# Patient Record
Sex: Female | Born: 1965 | Race: Black or African American | Hispanic: No | Marital: Single | Smoking: Never smoker
Health system: Southern US, Community
[De-identification: ages and names within clinical notes are randomized; demographics above are authoritative.]

## PROBLEM LIST (undated history)

## (undated) DIAGNOSIS — Z21 Asymptomatic human immunodeficiency virus [HIV] infection status: Secondary | ICD-10-CM

## (undated) DIAGNOSIS — I10 Essential (primary) hypertension: Secondary | ICD-10-CM

## (undated) DIAGNOSIS — B20 Human immunodeficiency virus [HIV] disease: Secondary | ICD-10-CM

## (undated) DIAGNOSIS — F419 Anxiety disorder, unspecified: Secondary | ICD-10-CM

## (undated) DIAGNOSIS — T7840XA Allergy, unspecified, initial encounter: Secondary | ICD-10-CM

## (undated) DIAGNOSIS — G4700 Insomnia, unspecified: Secondary | ICD-10-CM

## (undated) HISTORY — DX: Essential (primary) hypertension: I10

## (undated) HISTORY — DX: Insomnia, unspecified: G47.00

## (undated) HISTORY — DX: Allergy, unspecified, initial encounter: T78.40XA

## (undated) HISTORY — DX: Anxiety disorder, unspecified: F41.9

---

## 1995-11-02 ENCOUNTER — Encounter (INDEPENDENT_AMBULATORY_CARE_PROVIDER_SITE_OTHER): Payer: Self-pay | Admitting: *Deleted

## 1995-11-02 LAB — CONVERTED CEMR LAB
CD4 Count: 384 microliters
CD4 T Cell Abs: 384

## 2000-11-18 ENCOUNTER — Encounter: Payer: Self-pay | Admitting: Emergency Medicine

## 2000-11-18 ENCOUNTER — Emergency Department (HOSPITAL_COMMUNITY): Admission: EM | Admit: 2000-11-18 | Discharge: 2000-11-18 | Payer: Self-pay | Admitting: *Deleted

## 2001-10-29 ENCOUNTER — Encounter: Admission: RE | Admit: 2001-10-29 | Discharge: 2001-10-29 | Payer: Self-pay | Admitting: Infectious Diseases

## 2001-10-29 ENCOUNTER — Encounter: Payer: Self-pay | Admitting: Infectious Diseases

## 2001-10-29 ENCOUNTER — Ambulatory Visit (HOSPITAL_COMMUNITY): Admission: RE | Admit: 2001-10-29 | Discharge: 2001-10-29 | Payer: Self-pay | Admitting: Infectious Diseases

## 2001-11-07 ENCOUNTER — Encounter: Admission: RE | Admit: 2001-11-07 | Discharge: 2001-11-07 | Payer: Self-pay | Admitting: Infectious Diseases

## 2002-01-02 ENCOUNTER — Encounter: Admission: RE | Admit: 2002-01-02 | Discharge: 2002-01-02 | Payer: Self-pay | Admitting: Infectious Diseases

## 2002-01-09 ENCOUNTER — Encounter: Admission: RE | Admit: 2002-01-09 | Discharge: 2002-01-09 | Payer: Self-pay | Admitting: Infectious Diseases

## 2002-03-06 ENCOUNTER — Encounter: Admission: RE | Admit: 2002-03-06 | Discharge: 2002-03-06 | Payer: Self-pay | Admitting: Infectious Diseases

## 2002-03-06 ENCOUNTER — Ambulatory Visit (HOSPITAL_COMMUNITY): Admission: RE | Admit: 2002-03-06 | Discharge: 2002-03-06 | Payer: Self-pay | Admitting: Infectious Diseases

## 2002-06-18 ENCOUNTER — Encounter: Admission: RE | Admit: 2002-06-18 | Discharge: 2002-06-18 | Payer: Self-pay | Admitting: *Deleted

## 2002-07-29 ENCOUNTER — Ambulatory Visit (HOSPITAL_COMMUNITY): Admission: RE | Admit: 2002-07-29 | Discharge: 2002-07-29 | Payer: Self-pay | Admitting: Infectious Diseases

## 2002-07-29 ENCOUNTER — Encounter: Admission: RE | Admit: 2002-07-29 | Discharge: 2002-07-29 | Payer: Self-pay | Admitting: Internal Medicine

## 2002-09-05 HISTORY — PX: ABDOMINAL HYSTERECTOMY: SHX81

## 2002-10-22 ENCOUNTER — Encounter: Admission: RE | Admit: 2002-10-22 | Discharge: 2002-10-22 | Payer: Self-pay | Admitting: Infectious Diseases

## 2002-10-24 ENCOUNTER — Ambulatory Visit (HOSPITAL_COMMUNITY): Admission: RE | Admit: 2002-10-24 | Discharge: 2002-10-24 | Payer: Self-pay | Admitting: Infectious Diseases

## 2003-03-31 ENCOUNTER — Encounter: Payer: Self-pay | Admitting: Infectious Diseases

## 2003-03-31 ENCOUNTER — Encounter: Admission: RE | Admit: 2003-03-31 | Discharge: 2003-03-31 | Payer: Self-pay | Admitting: Infectious Diseases

## 2003-03-31 ENCOUNTER — Ambulatory Visit (HOSPITAL_COMMUNITY): Admission: RE | Admit: 2003-03-31 | Discharge: 2003-03-31 | Payer: Self-pay | Admitting: Infectious Diseases

## 2003-06-27 ENCOUNTER — Ambulatory Visit (HOSPITAL_COMMUNITY): Admission: RE | Admit: 2003-06-27 | Discharge: 2003-06-27 | Payer: Self-pay | Admitting: Infectious Diseases

## 2003-06-27 ENCOUNTER — Encounter: Admission: RE | Admit: 2003-06-27 | Discharge: 2003-06-27 | Payer: Self-pay | Admitting: Family Medicine

## 2003-06-27 ENCOUNTER — Encounter: Admission: RE | Admit: 2003-06-27 | Discharge: 2003-06-27 | Payer: Self-pay | Admitting: Internal Medicine

## 2003-06-27 ENCOUNTER — Encounter: Payer: Self-pay | Admitting: Infectious Diseases

## 2003-06-27 ENCOUNTER — Encounter (INDEPENDENT_AMBULATORY_CARE_PROVIDER_SITE_OTHER): Payer: Self-pay | Admitting: *Deleted

## 2003-07-01 ENCOUNTER — Ambulatory Visit (HOSPITAL_COMMUNITY): Admission: RE | Admit: 2003-07-01 | Discharge: 2003-07-01 | Payer: Self-pay | Admitting: *Deleted

## 2003-07-04 ENCOUNTER — Encounter: Admission: RE | Admit: 2003-07-04 | Discharge: 2003-07-04 | Payer: Self-pay | Admitting: Family Medicine

## 2003-07-16 ENCOUNTER — Encounter: Admission: RE | Admit: 2003-07-16 | Discharge: 2003-07-16 | Payer: Self-pay | Admitting: Infectious Diseases

## 2003-07-28 ENCOUNTER — Inpatient Hospital Stay (HOSPITAL_COMMUNITY): Admission: RE | Admit: 2003-07-28 | Discharge: 2003-07-31 | Payer: Self-pay | Admitting: Family Medicine

## 2003-07-28 ENCOUNTER — Encounter (INDEPENDENT_AMBULATORY_CARE_PROVIDER_SITE_OTHER): Payer: Self-pay | Admitting: Specialist

## 2003-08-01 ENCOUNTER — Encounter (INDEPENDENT_AMBULATORY_CARE_PROVIDER_SITE_OTHER): Payer: Self-pay | Admitting: *Deleted

## 2003-08-01 LAB — CONVERTED CEMR LAB

## 2003-08-14 ENCOUNTER — Encounter: Admission: RE | Admit: 2003-08-14 | Discharge: 2003-08-14 | Payer: Self-pay | Admitting: Obstetrics and Gynecology

## 2003-09-04 ENCOUNTER — Encounter: Admission: RE | Admit: 2003-09-04 | Discharge: 2003-09-04 | Payer: Self-pay | Admitting: Obstetrics and Gynecology

## 2003-11-17 ENCOUNTER — Encounter: Admission: RE | Admit: 2003-11-17 | Discharge: 2003-11-17 | Payer: Self-pay | Admitting: Infectious Diseases

## 2003-11-17 ENCOUNTER — Ambulatory Visit (HOSPITAL_COMMUNITY): Admission: RE | Admit: 2003-11-17 | Discharge: 2003-11-17 | Payer: Self-pay | Admitting: Infectious Diseases

## 2004-02-03 ENCOUNTER — Encounter: Admission: RE | Admit: 2004-02-03 | Discharge: 2004-02-03 | Payer: Self-pay | Admitting: Infectious Diseases

## 2004-05-21 ENCOUNTER — Ambulatory Visit: Payer: Self-pay | Admitting: Infectious Diseases

## 2004-06-14 ENCOUNTER — Ambulatory Visit: Payer: Self-pay | Admitting: Infectious Diseases

## 2004-07-16 ENCOUNTER — Ambulatory Visit: Payer: Self-pay | Admitting: Family Medicine

## 2004-07-20 ENCOUNTER — Encounter: Admission: RE | Admit: 2004-07-20 | Discharge: 2004-07-20 | Payer: Self-pay | Admitting: Family Medicine

## 2005-01-12 ENCOUNTER — Ambulatory Visit (HOSPITAL_COMMUNITY): Admission: RE | Admit: 2005-01-12 | Discharge: 2005-01-12 | Payer: Self-pay | Admitting: Infectious Diseases

## 2005-01-12 ENCOUNTER — Ambulatory Visit: Payer: Self-pay | Admitting: Infectious Diseases

## 2005-01-25 ENCOUNTER — Ambulatory Visit: Payer: Self-pay | Admitting: Infectious Diseases

## 2005-04-13 ENCOUNTER — Emergency Department (HOSPITAL_COMMUNITY): Admission: EM | Admit: 2005-04-13 | Discharge: 2005-04-13 | Payer: Self-pay | Admitting: Emergency Medicine

## 2005-07-13 ENCOUNTER — Ambulatory Visit: Payer: Self-pay | Admitting: Infectious Diseases

## 2005-07-13 ENCOUNTER — Ambulatory Visit (HOSPITAL_COMMUNITY): Admission: RE | Admit: 2005-07-13 | Discharge: 2005-07-13 | Payer: Self-pay | Admitting: Infectious Diseases

## 2005-07-13 ENCOUNTER — Encounter (INDEPENDENT_AMBULATORY_CARE_PROVIDER_SITE_OTHER): Payer: Self-pay | Admitting: *Deleted

## 2005-07-13 LAB — CONVERTED CEMR LAB
CD4 Count: 910 microliters
HIV 1 RNA Quant: 399 copies/mL

## 2005-08-03 ENCOUNTER — Ambulatory Visit: Payer: Self-pay | Admitting: Infectious Diseases

## 2006-03-01 ENCOUNTER — Encounter (INDEPENDENT_AMBULATORY_CARE_PROVIDER_SITE_OTHER): Payer: Self-pay | Admitting: *Deleted

## 2006-03-01 ENCOUNTER — Encounter: Admission: RE | Admit: 2006-03-01 | Discharge: 2006-03-01 | Payer: Self-pay | Admitting: Infectious Diseases

## 2006-03-01 ENCOUNTER — Ambulatory Visit: Payer: Self-pay | Admitting: Infectious Diseases

## 2006-03-01 LAB — CONVERTED CEMR LAB
CD4 Count: 900 microliters
HIV 1 RNA Quant: 399 copies/mL

## 2006-03-15 ENCOUNTER — Ambulatory Visit: Payer: Self-pay | Admitting: Infectious Diseases

## 2006-05-31 ENCOUNTER — Encounter (INDEPENDENT_AMBULATORY_CARE_PROVIDER_SITE_OTHER): Payer: Self-pay | Admitting: *Deleted

## 2006-05-31 ENCOUNTER — Ambulatory Visit: Payer: Self-pay | Admitting: Infectious Diseases

## 2006-05-31 ENCOUNTER — Encounter: Admission: RE | Admit: 2006-05-31 | Discharge: 2006-05-31 | Payer: Self-pay | Admitting: Infectious Diseases

## 2006-05-31 LAB — CONVERTED CEMR LAB
CD4 Count: 630 microliters
HIV 1 RNA Quant: 49 copies/mL

## 2006-06-15 DIAGNOSIS — G4700 Insomnia, unspecified: Secondary | ICD-10-CM | POA: Insufficient documentation

## 2006-06-15 DIAGNOSIS — I10 Essential (primary) hypertension: Secondary | ICD-10-CM | POA: Insufficient documentation

## 2006-06-15 DIAGNOSIS — B20 Human immunodeficiency virus [HIV] disease: Secondary | ICD-10-CM | POA: Insufficient documentation

## 2006-06-21 ENCOUNTER — Ambulatory Visit: Payer: Self-pay | Admitting: Infectious Diseases

## 2006-08-02 ENCOUNTER — Encounter: Admission: RE | Admit: 2006-08-02 | Discharge: 2006-08-02 | Payer: Self-pay | Admitting: Obstetrics & Gynecology

## 2006-08-14 ENCOUNTER — Ambulatory Visit: Payer: Self-pay | Admitting: Infectious Diseases

## 2006-10-27 DIAGNOSIS — E881 Lipodystrophy, not elsewhere classified: Secondary | ICD-10-CM | POA: Insufficient documentation

## 2006-10-27 DIAGNOSIS — D649 Anemia, unspecified: Secondary | ICD-10-CM | POA: Insufficient documentation

## 2006-10-30 ENCOUNTER — Encounter (INDEPENDENT_AMBULATORY_CARE_PROVIDER_SITE_OTHER): Payer: Self-pay | Admitting: *Deleted

## 2006-10-30 LAB — CONVERTED CEMR LAB

## 2006-11-01 ENCOUNTER — Encounter: Admission: RE | Admit: 2006-11-01 | Discharge: 2006-11-01 | Payer: Self-pay | Admitting: Infectious Diseases

## 2006-11-01 ENCOUNTER — Ambulatory Visit: Payer: Self-pay | Admitting: Infectious Diseases

## 2006-11-01 LAB — CONVERTED CEMR LAB
ALT: 10 units/L (ref 0–35)
AST: 14 units/L (ref 0–37)
Albumin: 4 g/dL (ref 3.5–5.2)
Alkaline Phosphatase: 96 units/L (ref 39–117)
BUN: 9 mg/dL (ref 6–23)
Basophils Absolute: 0 10*3/uL (ref 0.0–0.1)
Basophils Relative: 0 % (ref 0–1)
CD4 % Helper T Cell: 54 %
CD4 Count: 660 microliters
CO2: 30 meq/L (ref 19–32)
Calcium: 9.4 mg/dL (ref 8.4–10.5)
Chloride: 107 meq/L (ref 96–112)
Cholesterol: 167 mg/dL (ref 0–200)
Creatinine, Ser: 0.9 mg/dL (ref 0.40–1.20)
Eosinophils Absolute: 0.1 10*3/uL (ref 0.0–0.7)
Eosinophils Relative: 2 % (ref 0–5)
Glucose, Bld: 100 mg/dL — ABNORMAL HIGH (ref 70–99)
HCT: 42.9 % (ref 36.0–46.0)
HDL: 38 mg/dL — ABNORMAL LOW (ref >39)
HIV 1 RNA Quant: <50 copies/mL (ref <50)
HIV-1 RNA Quant, Log: <1.7 (ref <1.70)
Hemoglobin: 13.3 g/dL (ref 12.0–15.0)
LDL Cholesterol: 113 mg/dL — ABNORMAL HIGH (ref 0–99)
Lymphocytes Relative: 30 % (ref 12–46)
Lymphs Abs: 1.2 10*3/uL (ref 0.7–3.3)
MCHC: 31 g/dL (ref 30.0–36.0)
MCV: 87.7 fL (ref 78.0–100.0)
Monocytes Absolute: 0.3 10*3/uL (ref 0.2–0.7)
Monocytes Relative: 6 % (ref 3–11)
Neutro Abs: 2.5 10*3/uL (ref 1.7–7.7)
Neutrophils Relative %: 62 % (ref 43–77)
Platelets: 291 10*3/uL (ref 150–400)
Potassium: 4.1 meq/L (ref 3.5–5.3)
RBC: 4.89 M/uL (ref 3.87–5.11)
RDW: 14.2 % — ABNORMAL HIGH (ref 11.5–14.0)
Sodium: 143 meq/L (ref 135–145)
Total Bilirubin: 0.4 mg/dL (ref 0.3–1.2)
Total CHOL/HDL Ratio: 4.4
Total Protein: 7.1 g/dL (ref 6.0–8.3)
Triglycerides: 82 mg/dL (ref <150)
VLDL: 16 mg/dL (ref 0–40)
WBC: 4.1 10*3/uL (ref 4.0–10.5)

## 2006-11-12 ENCOUNTER — Encounter (INDEPENDENT_AMBULATORY_CARE_PROVIDER_SITE_OTHER): Payer: Self-pay | Admitting: *Deleted

## 2006-11-15 ENCOUNTER — Ambulatory Visit: Payer: Self-pay | Admitting: Infectious Diseases

## 2006-11-15 LAB — CONVERTED CEMR LAB: Pap Smear: NORMAL

## 2007-05-23 ENCOUNTER — Encounter: Admission: RE | Admit: 2007-05-23 | Discharge: 2007-05-23 | Payer: Self-pay | Admitting: Infectious Diseases

## 2007-05-23 ENCOUNTER — Ambulatory Visit: Payer: Self-pay | Admitting: Infectious Diseases

## 2007-05-23 LAB — CONVERTED CEMR LAB
ALT: 8 units/L (ref 0–35)
AST: 13 units/L (ref 0–37)
Albumin: 4.3 g/dL (ref 3.5–5.2)
Alkaline Phosphatase: 97 units/L (ref 39–117)
BUN: 11 mg/dL (ref 6–23)
Basophils Absolute: 0 10*3/uL (ref 0.0–0.1)
Basophils Relative: 0 % (ref 0–1)
Bilirubin Urine: NEGATIVE
CO2: 27 meq/L (ref 19–32)
Calcium: 9.4 mg/dL (ref 8.4–10.5)
Chloride: 108 meq/L (ref 96–112)
Creatinine, Ser: 0.92 mg/dL (ref 0.40–1.20)
Eosinophils Absolute: 0.1 10*3/uL (ref 0.0–0.7)
Eosinophils Relative: 1 % (ref 0–5)
Glucose, Bld: 87 mg/dL (ref 70–99)
HCT: 42.6 % (ref 36.0–46.0)
HIV 1 RNA Quant: <50 copies/mL (ref <50)
HIV-1 RNA Quant, Log: <1.7 (ref <1.70)
Hemoglobin, Urine: NEGATIVE
Hemoglobin: 13.8 g/dL (ref 12.0–15.0)
Ketones, ur: NEGATIVE mg/dL
Leukocytes, UA: NEGATIVE
Lymphocytes Relative: 33 % (ref 12–46)
Lymphs Abs: 1.6 10*3/uL (ref 0.7–3.3)
MCHC: 32.4 g/dL (ref 30.0–36.0)
MCV: 85.5 fL (ref 78.0–100.0)
Monocytes Absolute: 0.4 10*3/uL (ref 0.2–0.7)
Monocytes Relative: 8 % (ref 3–11)
Neutro Abs: 2.8 10*3/uL (ref 1.7–7.7)
Neutrophils Relative %: 57 % (ref 43–77)
Nitrite: NEGATIVE
Platelets: 301 10*3/uL (ref 150–400)
Potassium: 3.9 meq/L (ref 3.5–5.3)
Protein, ur: NEGATIVE mg/dL
RBC: 4.98 M/uL (ref 3.87–5.11)
RDW: 14.1 % — ABNORMAL HIGH (ref 11.5–14.0)
Sodium: 145 meq/L (ref 135–145)
Specific Gravity, Urine: 1.019 (ref 1.005–1.03)
Total Bilirubin: 0.3 mg/dL (ref 0.3–1.2)
Total Protein: 7.3 g/dL (ref 6.0–8.3)
Urine Glucose: NEGATIVE mg/dL
Urobilinogen, UA: 0.2 (ref 0.0–1.0)
WBC: 4.9 10*3/uL (ref 4.0–10.5)
pH: 6 (ref 5.0–8.0)

## 2007-06-13 ENCOUNTER — Ambulatory Visit: Payer: Self-pay | Admitting: Infectious Diseases

## 2007-08-15 ENCOUNTER — Encounter: Admission: RE | Admit: 2007-08-15 | Discharge: 2007-08-15 | Payer: Self-pay | Admitting: Infectious Diseases

## 2007-08-28 ENCOUNTER — Telehealth: Payer: Self-pay | Admitting: Infectious Diseases

## 2007-09-04 ENCOUNTER — Encounter: Payer: Self-pay | Admitting: Infectious Diseases

## 2007-09-05 ENCOUNTER — Encounter (INDEPENDENT_AMBULATORY_CARE_PROVIDER_SITE_OTHER): Payer: Self-pay | Admitting: *Deleted

## 2007-11-29 ENCOUNTER — Encounter (INDEPENDENT_AMBULATORY_CARE_PROVIDER_SITE_OTHER): Payer: Self-pay | Admitting: *Deleted

## 2007-12-26 ENCOUNTER — Ambulatory Visit: Payer: Self-pay | Admitting: Infectious Diseases

## 2007-12-26 ENCOUNTER — Encounter: Admission: RE | Admit: 2007-12-26 | Discharge: 2007-12-26 | Payer: Self-pay | Admitting: Infectious Diseases

## 2007-12-26 LAB — CONVERTED CEMR LAB
ALT: 8 units/L (ref 0–35)
AST: 13 units/L (ref 0–37)
Albumin: 4.3 g/dL (ref 3.5–5.2)
Alkaline Phosphatase: 100 units/L (ref 39–117)
BUN: 9 mg/dL (ref 6–23)
Basophils Absolute: 0 10*3/uL (ref 0.0–0.1)
Basophils Relative: 0 % (ref 0–1)
Bilirubin Urine: NEGATIVE
CO2: 23 meq/L (ref 19–32)
Calcium: 8.8 mg/dL (ref 8.4–10.5)
Chloride: 108 meq/L (ref 96–112)
Cholesterol: 157 mg/dL (ref 0–200)
Creatinine, Ser: 0.92 mg/dL (ref 0.40–1.20)
Eosinophils Absolute: 0.1 10*3/uL (ref 0.0–0.7)
Eosinophils Relative: 1 % (ref 0–5)
Glucose, Bld: 72 mg/dL (ref 70–99)
HCT: 40.4 % (ref 36.0–46.0)
HDL: 35 mg/dL — ABNORMAL LOW (ref >39)
HIV 1 RNA Quant: <50 copies/mL (ref <50)
HIV-1 RNA Quant, Log: <1.7 (ref <1.70)
Hemoglobin, Urine: NEGATIVE
Hemoglobin: 13 g/dL (ref 12.0–15.0)
Ketones, ur: NEGATIVE mg/dL
LDL Cholesterol: 104 mg/dL — ABNORMAL HIGH (ref 0–99)
Leukocytes, UA: NEGATIVE
Lymphocytes Relative: 29 % (ref 12–46)
Lymphs Abs: 1.5 10*3/uL (ref 0.7–4.0)
MCHC: 32.2 g/dL (ref 30.0–36.0)
MCV: 85.8 fL (ref 78.0–100.0)
Monocytes Absolute: 0.5 10*3/uL (ref 0.1–1.0)
Monocytes Relative: 10 % (ref 3–12)
Neutro Abs: 3.1 10*3/uL (ref 1.7–7.7)
Neutrophils Relative %: 59 % (ref 43–77)
Nitrite: NEGATIVE
Platelets: 313 10*3/uL (ref 150–400)
Potassium: 4.3 meq/L (ref 3.5–5.3)
Protein, ur: NEGATIVE mg/dL
RBC: 4.71 M/uL (ref 3.87–5.11)
RDW: 14.7 % (ref 11.5–15.5)
Sodium: 144 meq/L (ref 135–145)
Specific Gravity, Urine: 1.008 (ref 1.005–1.03)
Total Bilirubin: 0.3 mg/dL (ref 0.3–1.2)
Total CHOL/HDL Ratio: 4.5
Total Protein: 7.4 g/dL (ref 6.0–8.3)
Triglycerides: 88 mg/dL (ref <150)
Urine Glucose: NEGATIVE mg/dL
Urobilinogen, UA: 0.2 (ref 0.0–1.0)
VLDL: 18 mg/dL (ref 0–40)
WBC: 5.2 10*3/uL (ref 4.0–10.5)
pH: 6.5 (ref 5.0–8.0)

## 2008-01-09 ENCOUNTER — Ambulatory Visit: Payer: Self-pay | Admitting: Infectious Diseases

## 2008-06-18 ENCOUNTER — Encounter: Payer: Self-pay | Admitting: Infectious Diseases

## 2008-06-18 ENCOUNTER — Ambulatory Visit: Payer: Self-pay | Admitting: Internal Medicine

## 2008-06-18 LAB — CONVERTED CEMR LAB
ALT: 8 units/L (ref 0–35)
AST: 11 units/L (ref 0–37)
Albumin: 4.3 g/dL (ref 3.5–5.2)
Alkaline Phosphatase: 90 units/L (ref 39–117)
BUN: 16 mg/dL (ref 6–23)
CO2: 24 meq/L (ref 19–32)
Calcium: 9.4 mg/dL (ref 8.4–10.5)
Chloride: 108 meq/L (ref 96–112)
Creatinine, Ser: 0.98 mg/dL (ref 0.40–1.20)
Glucose, Bld: 96 mg/dL (ref 70–99)
HCT: 41.2 % (ref 36.0–46.0)
HIV 1 RNA Quant: <50 copies/mL (ref <50)
HIV-1 RNA Quant, Log: <1.7 (ref <1.70)
Hemoglobin: 13.1 g/dL (ref 12.0–15.0)
MCHC: 31.8 g/dL (ref 30.0–36.0)
MCV: 86.6 fL (ref 78.0–100.0)
Platelets: 306 10*3/uL (ref 150–400)
Potassium: 3.8 meq/L (ref 3.5–5.3)
RBC: 4.76 M/uL (ref 3.87–5.11)
RDW: 14.4 % (ref 11.5–15.5)
Sodium: 144 meq/L (ref 135–145)
Total Bilirubin: 0.4 mg/dL (ref 0.3–1.2)
Total Protein: 7.4 g/dL (ref 6.0–8.3)
WBC: 5 10*3/uL (ref 4.0–10.5)

## 2008-07-09 ENCOUNTER — Ambulatory Visit: Payer: Self-pay | Admitting: Infectious Diseases

## 2008-07-28 ENCOUNTER — Telehealth: Payer: Self-pay | Admitting: Infectious Diseases

## 2008-08-01 ENCOUNTER — Ambulatory Visit (HOSPITAL_COMMUNITY): Admission: RE | Admit: 2008-08-01 | Discharge: 2008-08-01 | Payer: Self-pay | Admitting: Infectious Diseases

## 2008-08-04 ENCOUNTER — Telehealth: Payer: Self-pay | Admitting: Infectious Diseases

## 2008-08-08 ENCOUNTER — Encounter: Payer: Self-pay | Admitting: Infectious Diseases

## 2008-09-10 ENCOUNTER — Encounter: Payer: Self-pay | Admitting: Infectious Diseases

## 2008-09-10 ENCOUNTER — Encounter: Admission: RE | Admit: 2008-09-10 | Discharge: 2008-09-10 | Payer: Self-pay | Admitting: Obstetrics & Gynecology

## 2008-09-15 ENCOUNTER — Telehealth (INDEPENDENT_AMBULATORY_CARE_PROVIDER_SITE_OTHER): Payer: Self-pay | Admitting: *Deleted

## 2008-11-10 ENCOUNTER — Telehealth: Payer: Self-pay | Admitting: Infectious Diseases

## 2008-12-22 ENCOUNTER — Ambulatory Visit: Payer: Self-pay | Admitting: Infectious Diseases

## 2008-12-22 LAB — CONVERTED CEMR LAB
ALT: 8 units/L (ref 0–35)
AST: 11 units/L (ref 0–37)
Albumin: 4.4 g/dL (ref 3.5–5.2)
Alkaline Phosphatase: 93 units/L (ref 39–117)
BUN: 12 mg/dL (ref 6–23)
Basophils Absolute: 0 10*3/uL (ref 0.0–0.1)
Basophils Relative: 1 % (ref 0–1)
CO2: 25 meq/L (ref 19–32)
Calcium: 9.8 mg/dL (ref 8.4–10.5)
Chloride: 105 meq/L (ref 96–112)
Cholesterol: 172 mg/dL (ref 0–200)
Creatinine, Ser: 0.98 mg/dL (ref 0.40–1.20)
Eosinophils Absolute: 0 10*3/uL (ref 0.0–0.7)
Eosinophils Relative: 0 % (ref 0–5)
GFR calc Af Amer: >60 mL/min (ref >60)
GFR calc non Af Amer: >60 mL/min (ref >60)
Glucose, Bld: 82 mg/dL (ref 70–99)
HCT: 42.2 % (ref 36.0–46.0)
HDL: 39 mg/dL — ABNORMAL LOW (ref >39)
HIV 1 RNA Quant: <48 copies/mL (ref <48)
HIV-1 RNA Quant, Log: <1.68 (ref <1.68)
Hemoglobin: 13.5 g/dL (ref 12.0–15.0)
LDL Cholesterol: 115 mg/dL — ABNORMAL HIGH (ref 0–99)
Lymphocytes Relative: 29 % (ref 12–46)
Lymphs Abs: 1.5 10*3/uL (ref 0.7–4.0)
MCHC: 32 g/dL (ref 30.0–36.0)
MCV: 86.8 fL (ref 78.0–100.0)
Monocytes Absolute: 0.4 10*3/uL (ref 0.1–1.0)
Monocytes Relative: 8 % (ref 3–12)
Neutro Abs: 3.3 10*3/uL (ref 1.7–7.7)
Neutrophils Relative %: 62 % (ref 43–77)
Platelets: 323 10*3/uL (ref 150–400)
Potassium: 4.5 meq/L (ref 3.5–5.3)
RBC: 4.86 M/uL (ref 3.87–5.11)
RDW: 14.2 % (ref 11.5–15.5)
Sodium: 142 meq/L (ref 135–145)
Total Bilirubin: 0.5 mg/dL (ref 0.3–1.2)
Total CHOL/HDL Ratio: 4.4
Total Protein: 7.8 g/dL (ref 6.0–8.3)
Triglycerides: 90 mg/dL (ref <150)
VLDL: 18 mg/dL (ref 0–40)
WBC: 5.3 10*3/uL (ref 4.0–10.5)

## 2009-01-07 ENCOUNTER — Ambulatory Visit: Payer: Self-pay | Admitting: Infectious Diseases

## 2009-04-28 ENCOUNTER — Telehealth: Payer: Self-pay | Admitting: Infectious Diseases

## 2009-08-24 ENCOUNTER — Ambulatory Visit: Payer: Self-pay | Admitting: Infectious Diseases

## 2009-08-24 LAB — CONVERTED CEMR LAB
ALT: 8 units/L (ref 0–35)
AST: 14 units/L (ref 0–37)
Albumin: 4.5 g/dL (ref 3.5–5.2)
Alkaline Phosphatase: 85 units/L (ref 39–117)
BUN: 12 mg/dL (ref 6–23)
Basophils Absolute: 0 10*3/uL (ref 0.0–0.1)
Basophils Relative: 0 % (ref 0–1)
CO2: 30 meq/L (ref 19–32)
Calcium: 9.5 mg/dL (ref 8.4–10.5)
Chloride: 105 meq/L (ref 96–112)
Creatinine, Ser: 1.03 mg/dL (ref 0.40–1.20)
Eosinophils Absolute: 0 10*3/uL (ref 0.0–0.7)
Eosinophils Relative: 1 % (ref 0–5)
Glucose, Bld: 92 mg/dL (ref 70–99)
HCT: 40.6 % (ref 36.0–46.0)
HIV 1 RNA Quant: <48 copies/mL (ref <48)
HIV-1 RNA Quant, Log: <1.68 (ref <1.68)
Hemoglobin: 13.2 g/dL (ref 12.0–15.0)
Lymphocytes Relative: 40 % (ref 12–46)
Lymphs Abs: 2 10*3/uL (ref 0.7–4.0)
MCHC: 32.5 g/dL (ref 30.0–36.0)
MCV: 85.3 fL (ref >=78.0)
Monocytes Absolute: 0.4 10*3/uL (ref 0.1–1.0)
Monocytes Relative: 8 % (ref 3–12)
Neutro Abs: 2.6 10*3/uL (ref 1.7–7.7)
Neutrophils Relative %: 52 % (ref 43–77)
Platelets: 293 10*3/uL (ref 150–400)
Potassium: 4.2 meq/L (ref 3.5–5.3)
RBC: 4.76 M/uL (ref 3.87–5.11)
RDW: 13.2 % (ref 11.5–15.5)
Sodium: 144 meq/L (ref 135–145)
Total Bilirubin: 0.3 mg/dL (ref 0.3–1.2)
Total Protein: 7.7 g/dL (ref 6.0–8.3)
WBC: 5 10*3/uL (ref 4.0–10.5)

## 2009-09-07 ENCOUNTER — Ambulatory Visit: Payer: Self-pay | Admitting: Infectious Diseases

## 2009-09-07 DIAGNOSIS — F411 Generalized anxiety disorder: Secondary | ICD-10-CM | POA: Insufficient documentation

## 2009-09-30 ENCOUNTER — Encounter: Admission: RE | Admit: 2009-09-30 | Discharge: 2009-09-30 | Payer: Self-pay | Admitting: Obstetrics & Gynecology

## 2009-10-19 ENCOUNTER — Telehealth: Payer: Self-pay | Admitting: Infectious Diseases

## 2009-11-27 ENCOUNTER — Telehealth: Payer: Self-pay | Admitting: Infectious Diseases

## 2010-05-13 ENCOUNTER — Telehealth (INDEPENDENT_AMBULATORY_CARE_PROVIDER_SITE_OTHER): Payer: Self-pay | Admitting: *Deleted

## 2010-05-13 ENCOUNTER — Emergency Department (HOSPITAL_COMMUNITY): Admission: EM | Admit: 2010-05-13 | Discharge: 2010-05-13 | Payer: Self-pay | Admitting: Emergency Medicine

## 2010-05-19 ENCOUNTER — Ambulatory Visit: Payer: Self-pay | Admitting: Infectious Diseases

## 2010-06-02 ENCOUNTER — Ambulatory Visit: Payer: Self-pay | Admitting: Infectious Diseases

## 2010-06-02 LAB — CONVERTED CEMR LAB
ALT: 9 units/L (ref 0–35)
AST: 17 units/L (ref 0–37)
Albumin: 4.2 g/dL (ref 3.5–5.2)
Alkaline Phosphatase: 63 units/L (ref 39–117)
BUN: 10 mg/dL (ref 6–23)
Basophils Absolute: 0 10*3/uL (ref 0.0–0.1)
Basophils Relative: 1 % (ref 0–1)
CO2: 27 meq/L (ref 19–32)
Calcium: 9.5 mg/dL (ref 8.4–10.5)
Chloride: 107 meq/L (ref 96–112)
Cholesterol: 156 mg/dL (ref 0–200)
Creatinine, Ser: 1 mg/dL (ref 0.40–1.20)
Eosinophils Absolute: 0 10*3/uL (ref 0.0–0.7)
Eosinophils Relative: 1 % (ref 0–5)
Glucose, Bld: 108 mg/dL — ABNORMAL HIGH (ref 70–99)
HCT: 39.8 % (ref 36.0–46.0)
HDL: 38 mg/dL — ABNORMAL LOW (ref >39)
HIV 1 RNA Quant: <20 copies/mL (ref <20)
HIV-1 RNA Quant, Log: <1.3 (ref <1.30)
Hemoglobin: 13 g/dL (ref 12.0–15.0)
LDL Cholesterol: 104 mg/dL — ABNORMAL HIGH (ref 0–99)
Lymphocytes Relative: 45 % (ref 12–46)
Lymphs Abs: 1.6 10*3/uL (ref 0.7–4.0)
MCHC: 32.7 g/dL (ref 30.0–36.0)
MCV: 85.6 fL (ref 78.0–100.0)
Monocytes Absolute: 0.3 10*3/uL (ref 0.1–1.0)
Monocytes Relative: 9 % (ref 3–12)
Neutro Abs: 1.6 10*3/uL — ABNORMAL LOW (ref 1.7–7.7)
Neutrophils Relative %: 44 % (ref 43–77)
Platelets: 272 10*3/uL (ref 150–400)
Potassium: 4 meq/L (ref 3.5–5.3)
RBC: 4.65 M/uL (ref 3.87–5.11)
RDW: 14.3 % (ref 11.5–15.5)
Sodium: 143 meq/L (ref 135–145)
Total Bilirubin: 0.4 mg/dL (ref 0.3–1.2)
Total CHOL/HDL Ratio: 4.1
Total Protein: 6.7 g/dL (ref 6.0–8.3)
Triglycerides: 72 mg/dL (ref <150)
VLDL: 14 mg/dL (ref 0–40)
WBC: 3.6 10*3/uL — ABNORMAL LOW (ref 4.0–10.5)

## 2010-06-16 ENCOUNTER — Ambulatory Visit: Payer: Self-pay | Admitting: Infectious Diseases

## 2010-09-25 ENCOUNTER — Encounter: Payer: Self-pay | Admitting: *Deleted

## 2010-10-05 NOTE — Progress Notes (Signed)
Summary: Bee sting  Phone Note Call from Patient   Caller: Patient Summary of Call: Pt. called c/o having been stung by a bee on the [place] yesterday.  Reports that ankle is swollen to twice its size and her leg is swollen from toe to calf.  She said it is hot to the touch.  Advised pt. she needs to be examined at ER.  Pt. stated she would go. Initial call taken by: Wendall Mola CMA Duncan Dull),  May 13, 2010 4:42 PM     Appended Document: Nurse Visit (Infectious Disease)  has had 2 yellow jacket stings in last 2 months. L ankle swollen to double it's nl size.  given Rx for epi-pen and instructed on indications to use (sob, stridor) and how to use it.   Medical History Prior Medications: VIRACEPT 625 MG TABS (NELFINAVIR MESYLATE) two twice a day TRUVADA 200-300 MG TABS (EMTRICITABINE-TENOFOVIR) Take 1 tablet by mouth once a day IMITREX 100 MG  TABS (SUMATRIPTAN SUCCINATE) Take 1 tablet by mouth once a day as needed AMBIEN CR 6.25 MG CR-TABS (ZOLPIDEM TARTRATE) 1 each night at hour of sleep as needed for insomina Current Allergies: ! SULFAPrescriptions: EPIPEN 0.3 MG/0.3ML DEVI (EPINEPHRINE) use as directed.  #2 x 1   Entered and Authorized by:   Johny Sax MD   Signed by:   Johny Sax MD on 05/19/2010   Method used:   Electronically to        [facility] (retail)       [address]       [place], [place]  [address]       Ph: [phone]       Fax: [phone]   RxID:   [id number]

## 2010-10-05 NOTE — Progress Notes (Signed)
Summary: Pt having Aches,congestions, and headaches  Phone Note Call from Patient   Caller: Patient Complaint: Headache Summary of Call: Patient called stating she does not feel well. Aches and congestion (she said its not bad)  and wants a work note. Don't know whether its a cold or allergies.  Did not go to work today.  Having headaches.   [phone] cell

## 2010-10-05 NOTE — Assessment & Plan Note (Signed)
Summary: F/U OV/VS   CC:  follow-up visit.  History of Present Illness: 45 yo F with HIV+, migraine headaches.  (06-02-10) CD4 850,  VL <20. no problems with meds. wt down, has been exercising. eating well. occas headache- taken imitrex x2 in last 3 months. usually takes excedrin migraine. no diarrhea except with coffee.  Preventive Screening-Counseling & Management  Alcohol-Tobacco     Alcohol drinks/day: 0     Alcohol type: wine     Smoking Status: never     Passive Smoke Exposure: no  Caffeine-Diet-Exercise     Caffeine use/day: coffee     Does Patient Exercise: yes     Type of exercise: work out     Times/week: 3  Safety-Violence-Falls     Risk analyst Use: yes   Updated Prior Medication List: VIRACEPT 625 MG TABS (NELFINAVIR MESYLATE) two twice a day TRUVADA 200-300 MG TABS (EMTRICITABINE-TENOFOVIR) Take 1 tablet by mouth once a day IMITREX 100 MG  TABS (SUMATRIPTAN SUCCINATE) Take 1 tablet by mouth once a day as needed AMBIEN 5 MG TABS (ZOLPIDEM TARTRATE) Take 1 tablet by mouth at bedtime as needed insomnia  Current Allergies (reviewed today): ! SULFA Past History:  Past medical, surgical, family and social histories (including risk factors) reviewed, and no changes noted (except as noted below).  Past Medical History: Reviewed history from 06/15/2006 and no changes required. HIV disease Hypertension Anemia-NOS  Family History: Reviewed history from 06/15/2006 and no changes required. Family History Diabetes 1st degree relative Family History Hypertension Family History of Stroke F 1st degree relative <60  Social History: Reviewed history from 06/15/2006 and no changes required. Never Smoked Alcohol use-no  Review of Systems       wt down 12#.   Vital Signs:  Patient profile:   45 year old female Height:      67 inches (170.18 cm) Weight:      163.0 pounds (74.09 kg) BMI:     25.62 Temp:     98.3 degrees F (36.83 degrees C) oral Pulse rate:   69 /  minute BP sitting:   133 / 84  (right arm)  Vitals Entered By: Baxter Hire) (June 16, 2010 10:39 AM) CC: follow-up visit Pain Assessment Patient in pain? no      Nutritional Status BMI of 25 - 29 = overweight Nutritional Status Detail appetite is okay per patient  Have you ever been in a relationship where you felt threatened, hurt or afraid?No   Does patient need assistance? Functional Status Self care Ambulation Normal   Physical Exam  General:  well-developed, well-nourished, and well-hydrated.   Eyes:  pupils equal, pupils round, and pupils reactive to light.   Mouth:  pharynx pink and moist and no exudates.   Neck:  no masses.   Lungs:  normal respiratory effort and normal breath sounds.   Heart:  normal rate, regular rhythm, and no murmur.   Abdomen:  soft, non-tender, and normal bowel sounds.          Medication Adherence: 06/16/2010   Adherence to medications reviewed with patient. Counseling to provide adequate adherence provided   Prevention For Positives: 06/16/2010   Safe sex practices discussed with patient. Condoms offered.                             Impression & Recommendations:  Problem # 1:  HIV DISEASE (ICD-042) last PAP 1 yr ago. has apt for  next month. offered condoms. wearing seatbelt. flu shot today. doing great. will see her back in 6 months.   Problem # 2:  MIGRAINE NEC W/O INTRACTABLE MIGRAINE (ICD-346.80) her imitrex is refilled.  Her updated medication list for this problem includes:    Imitrex 100 Mg Tabs (Sumatriptan succinate) .Marland Kitchen... Take 1 tablet by mouth once a day as needed  Problem # 3:  INSOMNIA, HX OF (ICD-V15.89) her Remus Loffler is refilled.   Medications Added to Medication List This Visit: 1)  Imitrex 100 Mg Tabs (Sumatriptan succinate) .... Take 1 tablet by mouth once a day as needed 2)  Ambien 5 Mg Tabs (Zolpidem tartrate) .... Take 1 tablet by mouth at bedtime as needed insomnia  Other Orders: Est. Patient  Level IV (11914) Future Orders: T-CD4SP ([facility]) (CD4SP) ... 12/13/2010 T-HIV Viral Load [id number]) ... 12/13/2010 T-Comprehensive Metabolic Panel [id number]) ... 12/13/2010 T-CBC w/Diff ([id number]) ... 12/13/2010 T-RPR (Syphilis) [id number]) ... 12/13/2010 T-Lipid Profile [id number]) ... 12/13/2010  Prescriptions: IMITREX 100 MG  TABS (SUMATRIPTAN SUCCINATE) Take 1 tablet by mouth once a day as needed  #5 x 3   Entered and Authorized by:   Johny Sax MD   Signed by:   Johny Sax MD on 06/16/2010   Method used:   Print then Give to Patient   RxID:   [id number] AMBIEN 5 MG TABS (ZOLPIDEM TARTRATE) Take 1 tablet by mouth at bedtime as needed insomnia  #30 x 3   Entered and Authorized by:   Johny Sax MD   Signed by:   Johny Sax MD on 06/16/2010   Method used:   Print then Give to Patient   RxID:   [id number]
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
## 2010-10-05 NOTE — Progress Notes (Signed)
Summary: Restart Gen Ambien  Phone Note From Pharmacy   Caller: [facility] DrMarland Kitchen Summary of Call: Patient asking for plain Ambien 10mg  one at bedtime be sent to [facility].  She has been getting the Ambien CR, but no longer wants that.  The Generic Ambien has been discontinued in EMR.  Do you want to restart this? Initial call taken by: Paulo Fruit  BS,CPht II,MPH,  October 19, 2009 4:31 PM  Follow-up for Phone Call        signed, thanks

## 2010-10-05 NOTE — Assessment & Plan Note (Signed)
Summary: flu vaccine given    Immunizations Administered:  Influenza Vaccine # 1:    Vaccine Type: Fluvax 3+    Site: left deltoid    Mfr: fluvirin    Dose: 0.5 ml    Route: IM    Given by: Tomasita Morrow RN    Exp. Date: 12/05/2010    Lot #: [id number]    VIS given: 03/30/10 version given May 19, 2010.  Flu Vaccine Consent Questions:    Do you have a history of severe allergic reactions to this vaccine? no    Any prior history of allergic reactions to egg and/or gelatin? no    Do you have a sensitivity to the preservative Thimersol? no    Do you have a past history of Guillan-Barre Syndrome? no    Do you currently have an acute febrile illness? no    Have you ever had a severe reaction to latex? no    Vaccine information given and explained to patient? yes    Are you currently pregnant? no

## 2010-10-21 ENCOUNTER — Other Ambulatory Visit: Payer: Self-pay | Admitting: Infectious Diseases

## 2010-10-21 DIAGNOSIS — Z1231 Encounter for screening mammogram for malignant neoplasm of breast: Secondary | ICD-10-CM

## 2010-10-29 ENCOUNTER — Ambulatory Visit
Admission: RE | Admit: 2010-10-29 | Discharge: 2010-10-29 | Disposition: A | Discharge status: Home / Self Care | Payer: BC Managed Care – PPO | Source: Ambulatory Visit | Attending: Infectious Diseases | Admitting: Infectious Diseases

## 2010-10-29 DIAGNOSIS — Z1231 Encounter for screening mammogram for malignant neoplasm of breast: Secondary | ICD-10-CM

## 2010-11-03 ENCOUNTER — Other Ambulatory Visit: Payer: Self-pay | Admitting: Infectious Diseases

## 2010-11-03 DIAGNOSIS — R928 Other abnormal and inconclusive findings on diagnostic imaging of breast: Secondary | ICD-10-CM

## 2010-11-04 ENCOUNTER — Encounter: Payer: Self-pay | Admitting: Infectious Diseases

## 2010-11-11 ENCOUNTER — Ambulatory Visit
Admission: RE | Admit: 2010-11-11 | Discharge: 2010-11-11 | Disposition: A | Discharge status: Home / Self Care | Payer: BC Managed Care – PPO | Source: Ambulatory Visit | Attending: Infectious Diseases | Admitting: Infectious Diseases

## 2010-11-11 DIAGNOSIS — R928 Other abnormal and inconclusive findings on diagnostic imaging of breast: Secondary | ICD-10-CM

## 2010-11-18 LAB — T-HELPER CELL (CD4) - (RCID CLINIC ONLY): CD4 T Cell Abs: 850 uL (ref 400–2700)

## 2010-12-02 ENCOUNTER — Other Ambulatory Visit: Payer: Self-pay | Admitting: *Deleted

## 2010-12-02 ENCOUNTER — Encounter: Payer: Self-pay | Admitting: Infectious Diseases

## 2010-12-02 ENCOUNTER — Other Ambulatory Visit: Payer: Self-pay | Admitting: Infectious Diseases

## 2010-12-02 DIAGNOSIS — G43909 Migraine, unspecified, not intractable, without status migrainosus: Secondary | ICD-10-CM

## 2010-12-02 MED ORDER — SUMATRIPTAN SUCCINATE 100 MG PO TABS: 100.0000 mg | ORAL_TABLET | Freq: Once | ORAL | Status: DC

## 2011-01-03 ENCOUNTER — Other Ambulatory Visit: Payer: Self-pay | Admitting: Infectious Diseases

## 2011-01-03 DIAGNOSIS — B20 Human immunodeficiency virus [HIV] disease: Secondary | ICD-10-CM

## 2011-01-21 NOTE — Group Therapy Note (Signed)
NAME:  [name], [name]                          ACCOUNT NO.:  [id number]   MEDICAL RECORD NO.:  [id number]                   PATIENT TYPE:  OUT   LOCATION:  [facility]                           FACILITY:  [facility]   PHYSICIAN:  Tinnie Gens, MD                     DATE OF BIRTH:  Aug 13, 1966   DATE OF SERVICE:  07/04/2003                                    CLINIC NOTE   CHIEF COMPLAINT:  Follow up ultrasound.   HISTORY OF PRESENT ILLNESS:  The patient is a 45 year old HIV-positive lady  who came in for her routine Pap smear.  She was having menorrhagia and  anemia previously and had been on birth control at that time.  This failed  to relent her symptoms and when she came in for her annual exam she was  found to have a pelvic mass.  An ultrasound was scheduled and on the  ultrasound she was found to have an 8.2 x 4.7 x 6.7 cystic right ovarian  mass consistent with endometrioma as well as multiple fibroids in the uterus  which was enlarged.  The patient comes in today to discuss definitive  treatment.  She is finished with her childbearing and would like  hysterectomy for her menorrhagia symptoms as well as removal of this cyst.   PAST MEDICAL HISTORY:  Significant for HIV positivity and history of anemia,  iron deficient.   ALLERGIES:  SULFA.   MEDICATIONS:  Viracept, Epivir, Vira, and Niferex.   PAST SURGICAL HISTORY:  None.   FAMILY HISTORY:  Significant for hypertension, diabetes, and atherosclerotic  vessel disease.   SOCIAL HISTORY:  She denies tobacco, alcohol, or drug use.   REVIEW OF SYMPTOMS:  Her 14-point review of systems is negative exempt as  stated in the HPI.   PHYSICAL EXAMINATION:  GENERAL:  She is a moderately-obese female.  She is  in no acute distress.  VITAL SIGNS:  Blood pressure is 142/97, weight is 206.6, pulse is 73,  respiratory rate is 16.  HEENT:  Normocephalic, atraumatic.  Sclerae anicteric.  NECK:  Supple, normal thyroid.  LUNGS:  Clear  bilaterally.  HEART:  Regular rate and rhythm.  No rubs, gallops, or murmurs.  ABDOMEN:  Soft, nontender, nondistended.  She has no lymphadenopathy.  GENITOURINARY:  Her external female genitalia are within normal limits.  The  vagina is clean and rugated.  The cervix is without lesion.  The uterus is  mid position and enlarged.  The adnexa there is a right cystic mass.  EXTREMITIES:  No cyanosis, clubbing, or edema.  SKIN:  No rashes.   IMPRESSION:  1. Fibroid uterus.  2. Menorrhagia.  3. Anemia secondary to #2.  4. Right ovarian mass.   PLAN:  TAH and right oophorectomy.  I discussed all this with the patient  and she would like to schedule as soon as possible.  Will call her  with the  results.                                               Tinnie Gens, MD    TP/MEDQ  D:  07/04/2003  T:  07/04/2003  Job:  [id number]

## 2011-01-21 NOTE — Group Therapy Note (Signed)
   NAMESHAKISHA, ABEND NO.:  [id number]   MEDICAL RECORD NO.:  [id number]                   PATIENT TYPE:  OUT   LOCATION:  [facility]                           FACILITY:  [facility]   PHYSICIAN:  Ellis Parents, MD                 DATE OF BIRTH:  [birth year]   DATE OF SERVICE:                                    CLINIC NOTE   HISTORY OF PRESENT ILLNESS:  This 45 year old patient comes in for routine  Pap smear.  Her menses have been regular.  She is not taking Yasmin any  longer.  The patient denies any pelvic symptoms.   PHYSICAL EXAMINATION:  PELVIC:  Vagina is clean.  Cervix is well  epithelialized and appears normal.  The uterus is in mid position and  appears to be normal size.  In the right adnexa is a slightly cystic,  slightly sensitive mass approximately 5 x 5 cm.  It may be a pedunculated  fibroid or it may possibly be an adnexal neoplasm.   ASSESSMENT/PLAN:  Pap smears taken.  Pelvic ultrasound is ordered.  If the  ultrasound shows this to be a fibroid no further action will be taken.  If  it is an adnexal neoplasm the patient will be notified and recalled.                                               Ellis Parents, MD    SA/MEDQ  D:  06/27/2003  T:  06/27/2003  Job:  [id number]

## 2011-01-21 NOTE — Group Therapy Note (Signed)
Mercedes [name], [name] NO.:  [id number]   MEDICAL RECORD NO.:  [id number]          PATIENT TYPE:  WOC   LOCATION:  [facility]                   FACILITY:  [facility]   PHYSICIAN:  Mercedes Gens, MD        DATE OF BIRTH:  November 06, 1965   DATE OF SERVICE:  07/16/2004                                    CLINIC NOTE   CHIEF COMPLAINT:  Annual exam.   HISTORY OF PRESENT ILLNESS:  The patient is a 45 year old HIV positive lady  who is status post supracervical hysterectomy and left salpingo-oophorectomy  in November of 2004.  She is HIV positive and needs annual Pap smears.   The patient complains of occasional vaginal spotting that she had in  October.  This was the first time she had had any of this and she has not  had any subsequent to that period and otherwise she is without complaint  today.   PAST MEDICAL HISTORY:  Significant for HIV.   PAST SURGICAL HISTORY:  Supracervical hysterectomy and LSO.   MEDICATIONS:  Epivir, Viracept, Zerit and Ambien.   ALLERGIES:  SULFA.   FAMILY HISTORY:  Hypertension, diabetes, and atherosclerotic disease.   SOCIAL HISTORY:  No tobacco, alcohol, or drug use.   A 14-point review of systems reviewed and negative except as stated in the  HPI.   PHYSICAL EXAMINATION:  GENERAL:  On exam today she is a moderately obese  female.  VITAL SIGNS:  Weight is 197, blood pressure is 129/94, pulse is 76.  BREASTS:  She has diffuse nodularity bilaterally with a larger focus on the  left breast that feels contiguous with surrounding fibrocystic change;  however, given its largeness it does feel different than the rest of the  breast.  LYMPHATICS:  There is no supraclavicular or axillary adenopathy.  ABDOMEN:  Soft, nontender, nondistended.  GENITOURINARY:  Normal external female genitalia.  The cervix is well  supported in the vagina which is pink and rugated and clean.  There is no  lesion on the cervix.  The adnexa are not really  appreciated.   IMPRESSION:  1.  Annual exam.  2.  Pap smear.  3.  Questionable breast mass.   PLAN:  1.  Diagnostic mammography.  2.  Pap smear today.  Will follow up as needed on that.      TP/MEDQ  D:  07/16/2004  T:  07/17/2004  Job:  [id number]

## 2011-01-21 NOTE — Discharge Summary (Signed)
[name], [name]                          ACCOUNT NO.:  [id number]   MEDICAL RECORD NO.:  [id number]                   PATIENT TYPE:  INP   LOCATION:  9322                                 [facility]   PHYSICIAN:  [name], M.D.                DATE OF BIRTH:  October 17, 1965   DATE OF ADMISSION:  07/28/2003  DATE OF DISCHARGE:                                 DISCHARGE SUMMARY   FINAL DIAGNOSES:  1. Fibroid uterus.  2. Endometriosis.  3. Pelvic pain.  4. Menorrhagia.  5. Human immunodeficiency virus.  6. Anemia.   PROCEDURES:  Supracervical hysterectomy and a left salpingo-oophorectomy.   REASON FOR ADMISSION:  Briefly, the patient is a 45 year old gravida 1 para  1 who has a history of fibroids, heavy bleeding, and pelvic pain that was  unresponsive to medical management who requested definitive therapy.  Please  see full H&P on the chart.   HOSPITAL COURSE:  The patient was admitted on the day of surgery where she  underwent a supracervical hysterectomy and LSO.  Postoperatively she was  transferred to the floor where she did well.  On postoperative day #1 her  Foley was discontinued.  She was voiding without difficulty.  Her diet was  slowly advanced.  She did pass gas and have a bowel movement on  postoperative day #2.  She was tolerating a regular diet at that time.  However, she developed some nausea and emesis questionably related to  Percocet.  She was kept one more day.  She remained with some nausea but had  been emesis-free for 24 hours and at that time it was felt she was stable  for discharge.  She was also afebrile during this course and had stable  vital signs.   DISCHARGE DISPOSITION AND CONDITION:  The patient is discharged home in good  condition.   DISCHARGE INSTRUCTIONS:  1. Medications:     a. Percocet 5/325 one to two p.o. q.4-6h. p.r.n. #48.     b. Phenergan 25 mg one p.o. q.4-6h. p.r.n. nausea.     c. She will resume her HIV medications at  home.  2. She should be on pelvic rest and no heavy lifting for the next six weeks.  3. She is instructed to return with temperature greater than 101 or     persistent nausea and vomiting.  4. She will follow up in GYN clinic in two weeks.                                               Mercedes Proctor. [name], M.D.    TSP/MEDQ  D:  07/31/2003  T:  07/31/2003  Job:  [id number]

## 2011-01-21 NOTE — Op Note (Signed)
[name], [name]                          ACCOUNT NO.:  [id number]   MEDICAL RECORD NO.:  [id number]                   PATIENT TYPE:  INP   LOCATION:  9322                                 FACILITY:  [facility]   PHYSICIAN:  Tanya S. Shawnie Pons, M.D.                DATE OF BIRTH:  1965-12-27   DATE OF PROCEDURE:  07/28/2003  DATE OF DISCHARGE:                                 OPERATIVE REPORT   PREOPERATIVE DIAGNOSES:  1. Fibroid uterus.  2. Pelvic mass.  3. Pelvic pain.  4. Menorrhagia.   POSTOPERATIVE DIAGNOSES:  1. Fibroid uterus.  2. Pelvic mass.  3. Pelvic pain.  4. Menorrhagia.   PROCEDURE:  Supracervical hysterectomy, left salpingo-oophorectomy, lysis of  adhesions, and multiple cystectomies.   SURGEON:  Shelbie Proctor. Shawnie Pons, M.D.   ASSISTANT:  Nash Dimmer Hickox.   ANESTHESIA:  General endotracheal with Dr. Jean Rosenthal.   FINDINGS:  Fibroid uterus and multiple areas of endometriosis and  endometrioma.   ESTIMATED BLOOD LOSS:  200 mL.   COMPLICATIONS:  None.   SPECIMENS:  Cysts and uterus and left tube and ovary to pathology.   REASON FOR PROCEDURE:  Briefly, the patient is a 45 year old G1, P1, who is  HIV-positive, who desires complete hysterectomy.  She does want her cervix  left in place, and this was discussed with the patient at length including  risks of cervical cancer.  She has desired to have this left.  Also, she was  noted to have a right-sided pelvic mass, for which we assumed a salpingo-  oophorectomy would be necessary.  The patient had a lot of pain with her  periods as well as menorrhagia that had been unaffected by hormonal  treatment.   DESCRIPTION OF PROCEDURE:  The patient was taken to the OR.  She was placed  in supine position.  After anesthesia was induced, she was prepped and  draped in the usual sterile fashion and a Foley catheter was placed inside  of her bladder.  She was then prepped and draped in the usual sterile  fashion.  A knife was used to make  a Pfannenstiel incision.  This was  carried down to the underlying fascia using the electrocautery.  The  superior and inferior edges of the fascia were then dissected off the  underlying rectus bluntly laterally and sharply in the midline.  Any  bleeders and perforating vessels through the muscle were cauterized.  The  rectus was split in the midline and the peritoneal cavity entered sharply.  Once the peritoneum was entered, the bladder was taken down and the Balfour  retractor placed inside the abdomen.  The bowel was packed away and the  uterus was exteriorized.  There was noted to be a large endometrioma at the  base of the right side and the broad ligament.  This ruptured upon removal  of the uterus and a copious amount of chocolate  cystic material was removed.  Attention was then turned to the ovaries.  The right one appeared normal.  The left one appeared very abnormal.  There was also a large fibroid off the  posterior portion of the uterus on the right side and it was unclear which  was either the endometrioma at the base of the uterus versus the fibroid.  On the top of the uterus was actually the pelvic mass, but clearly the right  ovary was normal.  The left ovary had what looks like an endometrioma on it  and because the right ovary was normal, it was felt better to leave that  ovary and take the left.  Attention was then turned to the round ligaments,  which were suture ligated and then cut with the electrocautery.  A bladder  flap was made on both sides.  The bladder was sharply dissected off the  uterus and down the cervix.  Attention was then turned to the left side,  where the posterior leaf of the broad ligament was dissected down to remove  the ureter away from the IP.  The IP was then grasped and doubly clamped and  a free tie and a suture were then placed about these clamps.  There was good  hemostasis noted.  Attention was then turned to the right side, where the   tubo-ovarian ligaments were doubly clamped and then dissected a free tie  followed by a suture ligature was placed after this.  Attention was then  turned to the uterine arteries.  They were clamped with curved Heaneys,  divided, and then suture ligated.  This was done on both sides.  A straight  Kocher clamp was then used on the left and a knife used to dissect off the  cervix.  These were suture ligated.  Two straight clamps were used on the  left side until it was felt that the cervix had been mostly removed but  before the uterosacral and cardinal ligaments were sacrificed.  A knife was  used to remove the portion of the cervix and the uterus and the left tube  and ovary.  The cervix was then oversewn with a #1 Vicryl suture in a locked  running fashion.  A second layer was needed to control hemostasis.  The two  sutures at either end of the cervix were then tied together.  The round and  the IP and the tubo-ovarian ligaments were looked at again above the uterine  artery and felt to be dry.  The abdomen was copiously irrigated and the  instruments, the retractor, and the lap pads were all removed from the  abdomen.  The fascia was then closed with #1 Vicryl suture in a running  fashion.  A second figure-of-eight was used to close the last 1.5 cm of  fascia at the patient's right side.  The subcutaneous tissue was also  copiously irrigated and any bleeders cauterized and the skin closed using  clips.  All instrument, needle, and lap counts were correct x2.  The patient  was awakened and taken to the recovery room in stable condition.                                               Shelbie Proctor. Shawnie Pons, M.D.    TSP/MEDQ  D:  07/28/2003  T:  07/29/2003  Job:  [id number] 

## 2011-04-18 ENCOUNTER — Other Ambulatory Visit: Payer: Self-pay

## 2011-04-18 ENCOUNTER — Other Ambulatory Visit: Payer: BC Managed Care – PPO

## 2011-04-18 ENCOUNTER — Other Ambulatory Visit: Payer: Self-pay | Admitting: Infectious Diseases

## 2011-04-18 DIAGNOSIS — Z113 Encounter for screening for infections with a predominantly sexual mode of transmission: Secondary | ICD-10-CM

## 2011-04-18 DIAGNOSIS — Z79899 Other long term (current) drug therapy: Secondary | ICD-10-CM

## 2011-04-18 DIAGNOSIS — B20 Human immunodeficiency virus [HIV] disease: Secondary | ICD-10-CM

## 2011-04-18 LAB — CBC WITH DIFFERENTIAL/PLATELET
Basophils Absolute: 0 10*3/uL (ref 0.0–0.1)
Eosinophils Absolute: 0 10*3/uL (ref 0.0–0.7)
Eosinophils Relative: 1 % (ref 0–5)
Lymphocytes Relative: 44 % (ref 12–46)
MCH: 28 pg (ref 26.0–34.0)
MCV: 89 fL (ref 78.0–100.0)
Neutrophils Relative %: 46 % (ref 43–77)
Platelets: 277 10*3/uL (ref 150–400)
RDW: 13.9 % (ref 11.5–15.5)
WBC: 4.3 10*3/uL (ref 4.0–10.5)

## 2011-04-18 LAB — RPR

## 2011-04-18 LAB — URINALYSIS, ROUTINE W REFLEX MICROSCOPIC
Hgb urine dipstick: NEGATIVE
Ketones, ur: NEGATIVE mg/dL
Leukocytes, UA: NEGATIVE
Nitrite: NEGATIVE
Protein, ur: NEGATIVE mg/dL
pH: 5.5 (ref 5.0–8.0)

## 2011-04-18 LAB — LIPID PANEL
Cholesterol: 188 mg/dL (ref 0–200)
HDL: 52 mg/dL (ref >39)
Triglycerides: 81 mg/dL (ref <150)

## 2011-04-19 LAB — COMPLETE METABOLIC PANEL WITH GFR
ALT: 10 U/L (ref 0–35)
AST: 16 U/L (ref 0–37)
Creat: 0.89 mg/dL (ref 0.50–1.10)
Total Bilirubin: 0.3 mg/dL (ref 0.3–1.2)

## 2011-04-19 LAB — GC/CHLAMYDIA PROBE AMP, URINE
Chlamydia, Swab/Urine, PCR: NEGATIVE
GC Probe Amp, Urine: NEGATIVE

## 2011-04-19 LAB — HIV-1 RNA QUANT-NO REFLEX-BLD: HIV-1 RNA Quant, Log: <1.3 {Log} (ref <1.30)

## 2011-05-02 ENCOUNTER — Encounter: Payer: Self-pay | Admitting: Infectious Diseases

## 2011-05-02 ENCOUNTER — Ambulatory Visit (INDEPENDENT_AMBULATORY_CARE_PROVIDER_SITE_OTHER): Payer: BC Managed Care – PPO | Admitting: Infectious Diseases

## 2011-05-02 VITALS — BP 126/87 | HR 62 | Temp 98.5°F | Ht 67.5 in | Wt 171.2 lb

## 2011-05-02 DIAGNOSIS — F411 Generalized anxiety disorder: Secondary | ICD-10-CM

## 2011-05-02 DIAGNOSIS — B20 Human immunodeficiency virus [HIV] disease: Secondary | ICD-10-CM

## 2011-05-02 DIAGNOSIS — Z9189 Other specified personal risk factors, not elsewhere classified: Secondary | ICD-10-CM

## 2011-05-02 DIAGNOSIS — Z23 Encounter for immunization: Secondary | ICD-10-CM

## 2011-05-02 DIAGNOSIS — E881 Lipodystrophy, not elsewhere classified: Secondary | ICD-10-CM

## 2011-05-02 MED ORDER — SERTRALINE HCL 25 MG PO TABS: 25.0000 mg | ORAL_TABLET | Freq: Every day | ORAL | Status: DC

## 2011-05-02 MED ORDER — EMTRICITAB-RILPIVIR-TENOFOV DF 200-25-300 MG PO TABS: 1.0000 | ORAL_TABLET | Freq: Every day | ORAL | Status: DC

## 2011-05-02 NOTE — Assessment & Plan Note (Signed)
Will see if getting her off PI will help this.

## 2011-05-02 NOTE — Assessment & Plan Note (Signed)
She is interested in wt change. Would like egrifta til she finds out it is an injection. Will change her to complera and see if that helps. She gets flu and PNVX today.

## 2011-05-02 NOTE — Assessment & Plan Note (Signed)
She has had f/u with [name]. Will start her on zoloft and see her back in 6 weeks to see effects.

## 2011-05-02 NOTE — Progress Notes (Signed)
  Subjective:    Patient ID: [name], female    DOB: [birth year], 45 y.o.   MRN: [id number]  HPI 45 yo F with HIV+, migraine headaches. CD4 1040, VL <20 (04-18-11). Having difficulty with sleep- waking more at night, more anxiety, trouble going to sleep and staying asleep. Has not taken ambien for several months, tried herbals (melatonin causes her to have HA when she wakes up). ? Starting menopause. Had previous hysterectomy. Doesn't think generic imitrex works as well as brand.     Review of Systems  Constitutional: Negative for unexpected weight change.  Respiratory: Negative for cough and shortness of breath.   Gastrointestinal: Negative for diarrhea and constipation.  Genitourinary: Negative for dysuria.  Neurological: Positive for headaches.  Psychiatric/Behavioral: Positive for sleep disturbance. The patient is nervous/anxious.        Objective:   Physical Exam  Constitutional: She appears well-developed and well-nourished.  Eyes: EOM are normal. Pupils are equal, round, and reactive to light.  Neck: Neck supple.  Cardiovascular: Normal rate, regular rhythm and normal heart sounds.   Pulmonary/Chest: Effort normal and breath sounds normal. No respiratory distress.  Abdominal: Soft. Bowel sounds are normal. She exhibits no distension. There is no tenderness.  Musculoskeletal: She exhibits no edema.          Assessment & Plan:

## 2011-05-02 NOTE — Assessment & Plan Note (Signed)
Spoke to her at length re: sleep hygeine. hopefully controlling her anxiety will improve her sleep.

## 2011-05-13 ENCOUNTER — Other Ambulatory Visit: Payer: Self-pay | Admitting: *Deleted

## 2011-05-13 DIAGNOSIS — B20 Human immunodeficiency virus [HIV] disease: Secondary | ICD-10-CM

## 2011-05-13 MED ORDER — EMTRICITAB-RILPIVIR-TENOFOV DF 200-25-300 MG PO TABS: 1.0000 | ORAL_TABLET | Freq: Every day | ORAL | Status: DC

## 2011-05-16 ENCOUNTER — Telehealth: Payer: Self-pay | Admitting: *Deleted

## 2011-05-16 NOTE — Telephone Encounter (Signed)
Phone call to [employer].  Was told that because the error was the MD's and not the pharmacy's that the pt would be responsible for the additional co-pay.  RN requested that [employer] call the pt and explain the situation due to the pt's misunderstanding.  [employer] stated that they would contact the pt.  Jennet Maduro, RN

## 2011-05-30 ENCOUNTER — Telehealth: Payer: Self-pay | Admitting: *Deleted

## 2011-05-30 ENCOUNTER — Other Ambulatory Visit (INDEPENDENT_AMBULATORY_CARE_PROVIDER_SITE_OTHER): Payer: BC Managed Care – PPO

## 2011-05-30 ENCOUNTER — Other Ambulatory Visit: Payer: Self-pay | Admitting: Infectious Diseases

## 2011-05-30 DIAGNOSIS — Z113 Encounter for screening for infections with a predominantly sexual mode of transmission: Secondary | ICD-10-CM

## 2011-05-30 DIAGNOSIS — B20 Human immunodeficiency virus [HIV] disease: Secondary | ICD-10-CM

## 2011-05-30 DIAGNOSIS — Z79899 Other long term (current) drug therapy: Secondary | ICD-10-CM

## 2011-05-30 NOTE — Telephone Encounter (Signed)
Pt worried that the swelling of lips this morning is related to taking Complera and Zoloft 05/02/11.  Wanting to know whether this is a side effect.  RN to contact MD for advice.  Text page to Dr. Ninetta Lights.   Jennet Maduro, RN. Dr. Ninetta Lights ordered that the pt should stop Zoloft.  Does not believe that the lip swelling is related to Complera.  Pt should return to see Dr. Ninetta Lights at his next available appt with labs prior.  Jennet Maduro, RN Appointments made with the pt.  Jennet Maduro, RN

## 2011-05-31 LAB — CBC WITH DIFFERENTIAL/PLATELET
Eosinophils Absolute: 0 10*3/uL (ref 0.0–0.7)
Eosinophils Relative: 1 % (ref 0–5)
HCT: 41.3 % (ref 36.0–46.0)
Lymphocytes Relative: 40 % (ref 12–46)
Lymphs Abs: 2 10*3/uL (ref 0.7–4.0)
MCH: 27.6 pg (ref 26.0–34.0)
MCV: 87.7 fL (ref 78.0–100.0)
Monocytes Absolute: 0.5 10*3/uL (ref 0.1–1.0)
RBC: 4.71 MIL/uL (ref 3.87–5.11)
RDW: 13.8 % (ref 11.5–15.5)
WBC: 4.9 10*3/uL (ref 4.0–10.5)

## 2011-05-31 LAB — COMPLETE METABOLIC PANEL WITH GFR
BUN: 9 mg/dL (ref 6–23)
CO2: 30 mEq/L (ref 19–32)
Calcium: 9.3 mg/dL (ref 8.4–10.5)
Chloride: 105 mEq/L (ref 96–112)
Creat: 0.93 mg/dL (ref 0.50–1.10)
GFR, Est African American: >60 mL/min (ref >60)
GFR, Est Non African American: >60 mL/min (ref >60)
Total Bilirubin: 0.4 mg/dL (ref 0.3–1.2)

## 2011-05-31 LAB — T-HELPER CELL (CD4) - (RCID CLINIC ONLY)
CD4 % Helper T Cell: 40
CD4 T Cell Abs: 1050 uL (ref 400–2700)
CD4 T Cell Abs: 590

## 2011-05-31 LAB — LIPID PANEL
LDL Cholesterol: 102 mg/dL — ABNORMAL HIGH (ref 0–99)
Triglycerides: 88 mg/dL (ref <150)

## 2011-05-31 LAB — RPR

## 2011-06-01 ENCOUNTER — Telehealth: Payer: Self-pay | Admitting: *Deleted

## 2011-06-01 LAB — HIV-1 RNA QUANT-NO REFLEX-BLD
HIV 1 RNA Quant: <20 copies/mL (ref <20)
HIV-1 RNA Quant, Log: <1.3 {Log} (ref <1.30)

## 2011-06-01 NOTE — Telephone Encounter (Signed)
RN returned phone call.  RN discussed increasing fluid intake, and fresh fruits/vegetables.  Pt shared that she has already started these without significant improvement.  Pt wanted to know whether she could start OTC Colace.  RN advised that she would need to continue drinking more fluids because Colace is a stool softener.  Pt verbalized understanding and stated she would try this.  RN reminded pt of upcoming appt with Dr. Ninetta Lights on 06/10/11.  RN encouraged pt to discuss her concerns at this visit.  Jennet Maduro, RN

## 2011-06-06 LAB — T-HELPER CELL (CD4) - (RCID CLINIC ONLY): CD4 T Cell Abs: 830

## 2011-06-10 ENCOUNTER — Ambulatory Visit (INDEPENDENT_AMBULATORY_CARE_PROVIDER_SITE_OTHER): Payer: BC Managed Care – PPO | Admitting: Infectious Diseases

## 2011-06-10 ENCOUNTER — Encounter: Payer: Self-pay | Admitting: Infectious Diseases

## 2011-06-10 VITALS — BP 132/88 | HR 66 | Temp 98.1°F | Ht 67.0 in | Wt 175.0 lb

## 2011-06-10 DIAGNOSIS — G43809 Other migraine, not intractable, without status migrainosus: Secondary | ICD-10-CM

## 2011-06-10 DIAGNOSIS — B20 Human immunodeficiency virus [HIV] disease: Secondary | ICD-10-CM

## 2011-06-10 DIAGNOSIS — I10 Essential (primary) hypertension: Secondary | ICD-10-CM

## 2011-06-10 DIAGNOSIS — F411 Generalized anxiety disorder: Secondary | ICD-10-CM

## 2011-06-10 MED ORDER — SUMATRIPTAN SUCCINATE 100 MG PO TABS: 100.0000 mg | ORAL_TABLET | Freq: Once | ORAL | Status: DC

## 2011-06-10 NOTE — Assessment & Plan Note (Signed)
Will continue to watch, borderline today on initial, improved on repeat. Secondary to anxiety?

## 2011-06-10 NOTE — Assessment & Plan Note (Signed)
She is doing well after having switched to complera. She is taking well although anxious about switching. She is offered condoms (refused), vax uptodate. rtc 4-5 monthswill get her on list for PAP.

## 2011-06-10 NOTE — Assessment & Plan Note (Signed)
Will refill her imitrex. Has been using OTC excedrin with good relief lately.

## 2011-06-10 NOTE — Assessment & Plan Note (Signed)
She is not taking zoloft and seems to be doing well. Will be available as needed for this.

## 2011-06-10 NOTE — Progress Notes (Signed)
  Subjective:    Patient ID: [name], female    DOB: 01/22/1966, 45 y.o.   MRN: [id number]  HPI 45 yo F with HIV+, migraine headaches. CD4 1050, VL <20 (05-30-11). Previously started on zoloft due to difficulty with anxiety. Took it for only 3-4 days, was afraid it would cause mood swings. Also concerned that it will increase her headaches.  Is worried she has gained wt since her previous visit- has been constipated and feels like she has gained the weight in her chest.   Review of Systems  Constitutional: Negative for appetite change and unexpected weight change.  Respiratory: Negative for shortness of breath.   Gastrointestinal: Negative for diarrhea and constipation.  Genitourinary: Negative for dysuria.       Objective:   Physical Exam  Constitutional: She appears well-developed and well-nourished.  Eyes: EOM are normal. Pupils are equal, round, and reactive to light.  Neck: Neck supple.  Cardiovascular: Normal rate, regular rhythm and normal heart sounds.   Pulmonary/Chest: Effort normal and breath sounds normal.  Abdominal: Soft. Bowel sounds are normal. There is no tenderness.  Lymphadenopathy:    She has no cervical adenopathy.  Psychiatric: She has a normal mood and affect.          Assessment & Plan:

## 2011-06-14 ENCOUNTER — Other Ambulatory Visit: Payer: Self-pay | Admitting: *Deleted

## 2011-06-14 DIAGNOSIS — G43809 Other migraine, not intractable, without status migrainosus: Secondary | ICD-10-CM

## 2011-06-14 MED ORDER — SUMATRIPTAN SUCCINATE 100 MG PO TABS: 100.0000 mg | ORAL_TABLET | Freq: Once | ORAL | Status: DC

## 2011-06-23 ENCOUNTER — Telehealth: Payer: Self-pay | Admitting: *Deleted

## 2011-06-23 NOTE — Telephone Encounter (Signed)
States she called the manufacturer of Complera. They told her that swollen lips was not a reported side effect of the drug. She knew she had changed makeup 6 weeks ago to [employer] & it has bismuth oxychloride in it which can cause lip swelling.  She is going to stop the makeup for a few weeks before she will decide if she wants to change HIV regime. Her # is [phone] if we need to call her

## 2011-06-23 NOTE — Telephone Encounter (Signed)
States she woke up to find her lips swollen again. States this happened once before. Denies any problem breathing or swallowing. States her feet & legs were swollen over the weekend. They are fine now since she elevated them whenever sitting. Also c/o itchiness "all over" . Told her she could take a benadryl if not going to be driving or at work. She is not longer taking the Zoloft. Is taking Colace. Feels these problems are related to her new HIV med regime. States she never had problems with her old meds.  Does not want to come in to see md. Wants a call back from md with an answer to this problem. # [phone]. Warned her to call 911 if she develops any problems breathing or swallowing. She agreed with this.

## 2011-06-23 NOTE — Telephone Encounter (Signed)
Called pt and got no response. Left voice mail for her to stop her current ART and resume her previous, let us know if she needs refills.

## 2011-06-24 ENCOUNTER — Telehealth: Payer: Self-pay | Admitting: *Deleted

## 2011-06-24 NOTE — Telephone Encounter (Signed)
Spoke w/ pt.  She agreed to discontinue "new" makeup for at least a week.  Still slight swelling this AM.  Swelling does go away throughout the day and by the evening is back to normal.  Pt verbalized understanding of this plan.  RN spoke w/ Dr. Ninetta Lights.  Agreed with plan.

## 2011-07-12 ENCOUNTER — Telehealth: Payer: Self-pay | Admitting: Licensed Clinical Social Worker

## 2011-07-12 NOTE — Telephone Encounter (Signed)
Patient called stating that she thinks she has a sinus infection, along with frequent headaches, and swollen eye lid. She requested an antibiotic and CT scan, I suggested that she would need to be seen first and let the physician determine what she needs. Patient agreed.

## 2011-07-13 ENCOUNTER — Encounter: Payer: Self-pay | Admitting: Internal Medicine

## 2011-07-13 ENCOUNTER — Ambulatory Visit (INDEPENDENT_AMBULATORY_CARE_PROVIDER_SITE_OTHER): Payer: BC Managed Care – PPO | Admitting: Internal Medicine

## 2011-07-13 VITALS — BP 139/98 | HR 78 | Temp 98.2°F | Ht 67.0 in | Wt 175.2 lb

## 2011-07-13 DIAGNOSIS — H2000 Unspecified acute and subacute iridocyclitis: Secondary | ICD-10-CM | POA: Insufficient documentation

## 2011-07-13 DIAGNOSIS — R600 Localized edema: Secondary | ICD-10-CM

## 2011-07-13 DIAGNOSIS — H5789 Other specified disorders of eye and adnexa: Secondary | ICD-10-CM

## 2011-07-13 NOTE — Progress Notes (Signed)
  Subjective:    Patient ID: [name], female    DOB: Apr 22, 1966, 45 y.o.   MRN: [id number]  HPI [name] is followed by my partner, Dr. Enedina Finner, for her stable HIV infection. She was changed to Complera in late August of this year. In September she had 2 transient episodes of lip swelling and she became concerned that it might be do to her new drug. However she switched the makeup that she was using and the problem seemed to go away. About one week ago she began to notice what she thought was swelling of her left upper eyelid. She states coworkers noted that her eyes did not look quite right. She also began to notice some pain under her left eye particularly when she bent over to tie her shoes. These problems have persisted and she's developed some mild pain behind her left ear and down the left side of her neck as well as some pain over her left eye. She was concerned that it might be due to Complera so she did not take it for 2 days last week but the problem did not go away. She thought it might be due to allergies but, she has been sneezing more so she tried taking some Benadryl but this did not seem to help either. She has not had any fever and does not feel like she has an upper respiratory tract infection.    Review of Systems  Constitutional: Negative for fever, chills, diaphoresis, activity change, appetite change, fatigue and unexpected weight change.  HENT: Positive for facial swelling, sneezing, neck pain, dental problem (She has noted a slight amount of swelling over her left upper teeth anteriorly.) and sinus pressure. Negative for hearing loss, ear pain, congestion, rhinorrhea and neck stiffness.   Eyes: Positive for photophobia. Negative for pain, discharge, itching and visual disturbance.  Respiratory: Positive for cough. Negative for shortness of breath.   Cardiovascular: Negative for chest pain.       Objective:   Physical Exam  Constitutional: No distress.    HENT:  Mouth/Throat: Oropharynx is clear and moist. No oropharyngeal exudate.       It does seem to be some mild swelling over her left upper teeth anteriorly. It is not typically tender. There are no mucosal lesions.  Eyes: Conjunctivae and EOM are normal. Pupils are equal, round, and reactive to light. Right eye exhibits no discharge. Left eye exhibits no discharge. No scleral icterus.       There is definite asymmetry of her upper eyelids. There is more of her left upper eyelid showing. I cannot tell if she has any edema or if this is due to lid lag. She has no proptosis. She has mild tenderness with pressure and over her left maxillary sinus. She has definite tenderness over the left frontal sinus and there appears to be some mild swelling there.  Neck: Neck supple.  Cardiovascular: Normal rate, regular rhythm and normal heart sounds.   No murmur heard. Pulmonary/Chest: Breath sounds normal. She has no wheezes. She has no rales.  Lymphadenopathy:    She has no cervical adenopathy.  Skin: No rash noted.  Psychiatric: She has a normal mood and affect.          Assessment & Plan:

## 2011-07-13 NOTE — Assessment & Plan Note (Signed)
I'm not entirely sure what is causing her left eyelid changes and discomfort. I most concerned about acute sinusitis we'll obtain a limited CT of her sinuses and orbits with contrast. I doubt this is an allergic reaction to her Complera.

## 2011-07-14 ENCOUNTER — Ambulatory Visit
Admission: RE | Admit: 2011-07-14 | Discharge: 2011-07-14 | Disposition: A | Discharge status: Home / Self Care | Payer: BC Managed Care – PPO | Source: Ambulatory Visit | Attending: Internal Medicine | Admitting: Internal Medicine

## 2011-07-14 MED ORDER — IOHEXOL 300 MG/ML  SOLN
75.0000 mL | Freq: Once | INTRAMUSCULAR | Status: AC | PRN
  Administered 2011-07-14: 75 mL via INTRAVENOUS

## 2011-07-15 ENCOUNTER — Emergency Department (HOSPITAL_COMMUNITY)
Admission: EM | Admit: 2011-07-15 | Discharge: 2011-07-15 | Disposition: A | Discharge status: Home / Self Care | Payer: BC Managed Care – PPO | Attending: Emergency Medicine | Admitting: Emergency Medicine

## 2011-07-15 ENCOUNTER — Encounter (HOSPITAL_COMMUNITY): Payer: Self-pay | Admitting: *Deleted

## 2011-07-15 DIAGNOSIS — G43909 Migraine, unspecified, not intractable, without status migrainosus: Secondary | ICD-10-CM | POA: Insufficient documentation

## 2011-07-15 DIAGNOSIS — H5712 Ocular pain, left eye: Secondary | ICD-10-CM

## 2011-07-15 DIAGNOSIS — Z79899 Other long term (current) drug therapy: Secondary | ICD-10-CM | POA: Insufficient documentation

## 2011-07-15 DIAGNOSIS — H11419 Vascular abnormalities of conjunctiva, unspecified eye: Secondary | ICD-10-CM | POA: Insufficient documentation

## 2011-07-15 DIAGNOSIS — H5789 Other specified disorders of eye and adnexa: Secondary | ICD-10-CM | POA: Insufficient documentation

## 2011-07-15 DIAGNOSIS — Z21 Asymptomatic human immunodeficiency virus [HIV] infection status: Secondary | ICD-10-CM | POA: Insufficient documentation

## 2011-07-15 DIAGNOSIS — H53149 Visual discomfort, unspecified: Secondary | ICD-10-CM | POA: Insufficient documentation

## 2011-07-15 DIAGNOSIS — H04209 Unspecified epiphora, unspecified lacrimal gland: Secondary | ICD-10-CM | POA: Insufficient documentation

## 2011-07-15 DIAGNOSIS — H209 Unspecified iridocyclitis: Secondary | ICD-10-CM | POA: Insufficient documentation

## 2011-07-15 DIAGNOSIS — H5710 Ocular pain, unspecified eye: Secondary | ICD-10-CM | POA: Insufficient documentation

## 2011-07-15 HISTORY — DX: Asymptomatic human immunodeficiency virus (hiv) infection status: Z21

## 2011-07-15 HISTORY — DX: Human immunodeficiency virus (HIV) disease: B20

## 2011-07-15 MED ORDER — PROPARACAINE HCL 0.5 % OP SOLN
OPHTHALMIC | Status: AC
  Administered 2011-07-15: 2 [drp]
  Filled 2011-07-15: qty 15

## 2011-07-15 MED ORDER — OXYCODONE-ACETAMINOPHEN 5-325 MG PO TABS: 2.0000 | ORAL_TABLET | ORAL | Status: AC | PRN

## 2011-07-15 MED ORDER — ONDANSETRON HCL 4 MG/2ML IJ SOLN
4.0000 mg | Freq: Once | INTRAMUSCULAR | Status: AC
  Administered 2011-07-15: 4 mg via INTRAVENOUS
  Filled 2011-07-15: qty 2

## 2011-07-15 MED ORDER — FLUORESCEIN SODIUM 1 MG OP STRP
ORAL_STRIP | OPHTHALMIC | Status: AC
  Administered 2011-07-15: 1 via OPHTHALMIC
  Filled 2011-07-15: qty 1

## 2011-07-15 MED ORDER — PROPARACAINE HCL 0.5 % OP SOLN
2.0000 [drp] | Freq: Once | OPHTHALMIC | Status: AC
  Administered 2011-07-15: 2 [drp] via OPHTHALMIC

## 2011-07-15 MED ORDER — OXYCODONE-ACETAMINOPHEN 5-325 MG PO TABS
2.0000 | ORAL_TABLET | Freq: Once | ORAL | Status: DC
  Filled 2011-07-15: qty 2

## 2011-07-15 MED ORDER — TETRACAINE HCL 0.5 % OP SOLN: 2.0000 [drp] | Freq: Once | OPHTHALMIC | Status: DC

## 2011-07-15 MED ORDER — MORPHINE SULFATE 4 MG/ML IJ SOLN
4.0000 mg | Freq: Once | INTRAMUSCULAR | Status: AC
  Administered 2011-07-15: 4 mg via INTRAVENOUS
  Filled 2011-07-15: qty 1

## 2011-07-15 NOTE — ED Notes (Signed)
Patient wants more pain meds after she was d/c'd told that normally we can'tm give meds once the patient has been discharged.  Attempted to locate [place] PA to see if additional pain meds could be given and was unable to locate him.  Dr Norlene Campbell sts that she never saw the patient and would not authorize any meds

## 2011-07-15 NOTE — ED Provider Notes (Signed)
Medical screening examination/treatment/procedure(s) were performed by non-physician practitioner and as supervising physician I was immediately available for consultation/collaboration.  Olivia Mackie, MD 07/15/11 [phone]

## 2011-07-15 NOTE — ED Provider Notes (Signed)
History     CSN: [id number] Arrival date & time: 07/15/2011 12:30 AM   First MD Initiated Contact with Patient 07/15/11 0111      Chief Complaint  Patient presents with  . Eye Pain     HPI History provided by the patient. Patient has significant history of for HIV, last CD4 count was 1050 and viral load was undetectable. The patient presents with complaints of left eye pain. Patient has had symptoms of pain and redness to left eye with some swelling around eye for the past 2 and half weeks. She was evaluated for this by her primary care doctor, Dr. Ninetta Lights. She was sent today for a CT scan of her face, but does not know the results.  Patient denies change in vision. She has excessive tearing of eye. Pain is worse with bright lights and movements. Pain has persisted whether eye is open or closed. Patient denies fever chills sweats.   Past Medical History  Diagnosis Date  . Migraine   . HIV (human immunodeficiency virus infection)     No past surgical history on file.  No family history on file.  History  Substance Use Topics  . Smoking status: Never Smoker   . Smokeless tobacco: Never Used  . Alcohol Use: No     socially    OB History    Grav Para Term Preterm Abortions TAB SAB Ect Mult Living                  Review of Systems  Constitutional: Negative for fever and chills.  HENT: Negative for congestion and rhinorrhea.   All other systems reviewed and are negative.    Allergies  Sulfonamide derivatives  Home Medications   Current Outpatient Rx  Name Route Sig Dispense Refill  . EMTRICITAB-RILPIVIR-TENOFOVIR 200-25-300 MG PO TABS Oral Take 1 tablet by mouth 1 day or 1 dose.      Marland Kitchen PROBIOTIC FORMULA PO Oral Take by mouth.     . DOCUSATE SODIUM 100 MG PO CAPS Oral Take 100 mg by mouth 2 (two) times daily.      . SUMATRIPTAN SUCCINATE 100 MG PO TABS Oral Take 100 mg by mouth once as needed.        BP 144/88  Pulse 62  Temp(Src) 98.3 F (36.8 C) (Oral)   Resp 16  SpO2 100%  Physical Exam  Nursing note and vitals reviewed. Constitutional: She is oriented to person, place, and time. She appears well-developed and well-nourished. No distress.  HENT:  Head: Normocephalic.  Mouth/Throat: Oropharynx is clear and moist.  Eyes: EOM are normal. Pupils are equal, round, and reactive to light. No foreign bodies found. Right eye exhibits no chemosis, no discharge and no exudate. No foreign body present in the right eye. Left eye exhibits discharge. Left eye exhibits no chemosis. No foreign body present in the left eye. Right conjunctiva is not injected. Left conjunctiva is injected. Left conjunctiva has no hemorrhage. No scleral icterus.  Slit lamp exam:      The right eye shows no anterior chamber bulge.       The left eye shows no corneal abrasion, no corneal ulcer, no foreign body, no hyphema and no fluorescein uptake.       No improvement of pain with 2 drops of proparacaine.  Left eye injected. Excessive tearing. No purulent discharge. No dendritic branching.  Left eye pressure:  12 mmHg  Vision: Right eye 20/30, left eye 20/40, both eyes 20/20.  Cardiovascular: Normal rate.   No murmur heard. Pulmonary/Chest: Effort normal and breath sounds normal.  Neurological: She is alert and oriented to person, place, and time.  Skin: Skin is warm. No rash noted.    ED Course  Procedures (including critical care time)  Labs Reviewed - No data to display Ct Maxillofacial W/cm  07/14/2011  *RADIOLOGY REPORT*  Clinical Data: New onset left periorbital pain and edema over past 2 weeks.  Photosensitive.  CT MAXILLOFACIAL WITH CONTRAST  Technique:  Multidetector CT imaging of the maxillofacial structures was performed with intravenous contrast. Multiplanar CT image reconstructions were also generated.  Contrast: 75mL OMNIPAQUE IOHEXOL 300 MG/ML IV SOLN  Comparison: 08/01/2008 CT.12  Findings: No evidence of orbital inflammation.  Symmetric appearance of the  cavernous sinus.  Tiny radiopaque structure anterior to the superior medial aspect of the right orbit.  Etiology indeterminate.  Partially empty expanded sella.  This was noted previously and may be an incidental finding although also described in patients with pseudotumor cerebri.  Minimal polypoid opacification inferior aspect of the left maxillary sinus otherwise visualized sinuses, mastoid air cells and middle ear cavities are clear.  IMPRESSION: No evidence of orbital inflammation.  Minimal polypoid opacification inferior aspect of the left maxillary sinus otherwise visualized sinuses, mastoid air cells and middle ear cavities are clear.  Partially empty sella.  Please see above.  Original Report Authenticated By: Fuller Canada, M.D.     1. Left eye pain   2. Iritis       MDM  Spoke with attending about patient and will consult ophthalmology.  Spoke with Dr. Redmond Baseman on call for ophthalmology. He would like to see patient in office later today at 11 AM. He does not recommend any other treatment at this time. He does agree with short prescription for oral pain medication. I will speak with patient about followup instructions appointment later today at 11 AM.        Angus Seller, PA 07/15/11 [phone]

## 2011-07-15 NOTE — ED Notes (Signed)
Per EMS: pt c/o left eye pain, onset acute, 16:30, pt earlier complained of HA pain, pt took Tylenol pm and Benadryl, with out relief, pt further states she was seen at the [facility] imaging for CT of the head, secondary to HA, no results at time, noted irritation to the let eye, redness present, no drainage noted from the site, pt also reports increase anxiety

## 2011-07-15 NOTE — ED Notes (Signed)
ZOX:[id number]<AV> Expected date:07/14/11<BR> Expected time:12:10 AM<BR> Means of arrival:Ambulance<BR> Comments:<BR> Eye and hip pain

## 2011-07-15 NOTE — ED Notes (Signed)
Pt reports left eye pain for 1.5 weeks. States that she went to her PCP on Wednesday and had a CT performed but does not know the results. States that she had the CT performed at [facility]. Pt reports photophobia, blurry vision, and pain behind left eye and down left side of face. Pt reports a history of migraines. Pt's left eye red, swollen, and draining at this time. Pupils 3, equal and reactive to light. Pt reports eye pain 10/10.

## 2011-07-15 NOTE — ED Notes (Signed)
Explained to patient that no further meds were ordered for patient that the PA wrote a prescription for her to have filled

## 2011-07-15 NOTE — ED Notes (Signed)
Pt states sister is coming from [place], [place] and should be at the hospital in the next hour.

## 2011-07-15 NOTE — ED Notes (Signed)
ED provider at the bedside.  

## 2011-07-18 ENCOUNTER — Encounter: Payer: Self-pay | Admitting: Internal Medicine

## 2011-07-18 ENCOUNTER — Ambulatory Visit: Payer: BC Managed Care – PPO | Admitting: Internal Medicine

## 2011-07-18 ENCOUNTER — Ambulatory Visit (INDEPENDENT_AMBULATORY_CARE_PROVIDER_SITE_OTHER): Payer: BC Managed Care – PPO | Admitting: Internal Medicine

## 2011-07-18 ENCOUNTER — Telehealth: Payer: Self-pay | Admitting: *Deleted

## 2011-07-18 VITALS — BP 161/121 | HR 61 | Temp 98.6°F | Ht 67.0 in | Wt 176.2 lb

## 2011-07-18 DIAGNOSIS — H2000 Unspecified acute and subacute iridocyclitis: Secondary | ICD-10-CM

## 2011-07-18 DIAGNOSIS — B20 Human immunodeficiency virus [HIV] disease: Secondary | ICD-10-CM

## 2011-07-18 MED ORDER — EMTRICITABINE-TENOFOVIR DF 200-300 MG PO TABS: 1.0000 | ORAL_TABLET | Freq: Every day | ORAL | Status: AC

## 2011-07-18 MED ORDER — NELFINAVIR MESYLATE 625 MG PO TABS: 1250.0000 mg | ORAL_TABLET | Freq: Two times a day (BID) | ORAL | Status: DC

## 2011-07-18 MED ORDER — EMTRICITABINE-TENOFOVIR DF 200-300 MG PO TABS: 1.0000 | ORAL_TABLET | Freq: Every day | ORAL | Status: DC

## 2011-07-18 NOTE — Progress Notes (Signed)
  Subjective:    Patient ID: [name], female    DOB: 1966-02-23, 45 y.o.   MRN: [id number]  HPI Kenadee is in for her followup visit. After her visit with me last week she developed worsening, severe left thigh pain, conjunctival injection, and more lid swelling such that her I would barely open. She says that it felt like she was being stabbed in her eye. She went to the emergency department where she was given a provisional diagnosis of iritis. She was referred to Dr. Redmond Baseman that afternoon on 11/9 where he also told her that he thought it was iritis but was uncertain as to the cause. She was started on tobramycin-dexamethasone eye drops and Keflex. She is feeling a little bit better and can open her eye now. She was given some Percocet in the emergency department but has only required 3 doses since her ED visit.  Because of her recent problems with lip swelling and then her recent acute iritis she is concerned that it might be some possible adverse reaction to Complera and would like to go back to her old regimen of Truvada and Viracept.   Review of Systems     Objective:   Physical Exam  Constitutional: She appears well-developed and well-nourished. No distress.  Eyes: EOM are normal. Pupils are equal, round, and reactive to light. Left eye exhibits no discharge.       She continues to have some prominence and puffiness of her left upper eyelid. Her left eye shows some mild conjunctival injection.   HIV 1 RNA Quant (copies/mL)  Date Value  05/30/2011 <20   04/18/2011 <20   06/02/2010 <20 copies/mL      CD4 T Cell Abs (cmm)  Date Value  05/30/2011 1050   04/18/2011 1040   06/02/2010 850              Assessment & Plan:

## 2011-07-18 NOTE — Progress Notes (Signed)
Addended by: Jennet Maduro D on: 07/18/2011 04:27 PM   Modules accepted: Orders

## 2011-07-18 NOTE — Telephone Encounter (Signed)
States she is not going to take the complera again. Cites too many problems with it. Wants to go back on her old regime. She had cancelled her appt today because it conflicted with her eye dr appt today.  Told her she will need to make another appt to discuss changes. She was not pleased that she had to make anopther appt but said she would. Explained that I cannot just order the previous drugs with out a doctor approval.. Transferred to front desk

## 2011-07-18 NOTE — Assessment & Plan Note (Signed)
And not sure what is causing her acute iritis. Her viral load has been suppressed for many years and her CD4 count has been well up in the normal range so this is not an immune reconstitution inflammatory syndrome. I am not sure that this is related to her recent switch to Complera. I cannot find any case reports of similar illness related to Complera but certainly agree with her concerns and will switch her back to Truvada and Viracept. I offered her a simpler regimen of Truvada and boosted Reyataz but she did not feel like she wanted the extra $100 she would have to pay monthly for a co-pay for a third medication. She also has about a 2-1/2 months supply of Viracept at home.

## 2011-08-10 ENCOUNTER — Other Ambulatory Visit: Payer: Self-pay | Admitting: Obstetrics & Gynecology

## 2011-08-10 DIAGNOSIS — Z1231 Encounter for screening mammogram for malignant neoplasm of breast: Secondary | ICD-10-CM

## 2011-08-18 ENCOUNTER — Telehealth: Payer: Self-pay | Admitting: *Deleted

## 2011-08-18 NOTE — Telephone Encounter (Signed)
Has been seeing an ophthalmologist due to eyelid swelling.  Was told that this was probably due to eyelid inflammation, cause unknown.  Has been taking Zoloft daily with resulting decrease in anxiety.  Called to ask whether she should continue the Zoloft or be changed to another antidepressive/antianxiety medication.  Requested refill or new rx to be called to [facility].  Please advise.

## 2011-09-19 ENCOUNTER — Telehealth: Payer: Self-pay | Admitting: *Deleted

## 2011-09-19 NOTE — Telephone Encounter (Signed)
States she stopped the zoloft due to the eye problems. Wants an antianxiety med called to [facility] in [place]. Told her I will ask md & let her know. [phone]

## 2011-09-20 ENCOUNTER — Other Ambulatory Visit: Payer: Self-pay | Admitting: *Deleted

## 2011-09-20 ENCOUNTER — Other Ambulatory Visit: Payer: Self-pay | Admitting: Infectious Diseases

## 2011-09-20 DIAGNOSIS — F419 Anxiety disorder, unspecified: Secondary | ICD-10-CM

## 2011-09-20 MED ORDER — ALPRAZOLAM 0.25 MG PO TABS: 0.2500 mg | ORAL_TABLET | Freq: Three times a day (TID) | ORAL | Status: DC | PRN

## 2011-09-20 NOTE — Telephone Encounter (Signed)
Changed to Xanax per Dr. Ninetta Lights and called to patient's pharmacy. Wendall Mola CMA

## 2011-10-20 LAB — HM PAP SMEAR: HM Pap smear: NORMAL

## 2011-10-27 ENCOUNTER — Other Ambulatory Visit: Payer: Self-pay | Admitting: *Deleted

## 2011-10-27 DIAGNOSIS — B20 Human immunodeficiency virus [HIV] disease: Secondary | ICD-10-CM

## 2011-10-27 MED ORDER — NELFINAVIR MESYLATE 625 MG PO TABS: 1250.0000 mg | ORAL_TABLET | Freq: Two times a day (BID) | ORAL | Status: DC

## 2011-10-31 ENCOUNTER — Ambulatory Visit
Admission: RE | Admit: 2011-10-31 | Discharge: 2011-10-31 | Disposition: A | Discharge status: Home / Self Care | Payer: BC Managed Care – PPO | Source: Ambulatory Visit | Attending: Obstetrics & Gynecology | Admitting: Obstetrics & Gynecology

## 2011-10-31 DIAGNOSIS — Z1231 Encounter for screening mammogram for malignant neoplasm of breast: Secondary | ICD-10-CM

## 2011-11-09 ENCOUNTER — Other Ambulatory Visit: Payer: BC Managed Care – PPO

## 2011-11-09 DIAGNOSIS — B20 Human immunodeficiency virus [HIV] disease: Secondary | ICD-10-CM

## 2011-11-10 LAB — COMPLETE METABOLIC PANEL WITH GFR
AST: 17 U/L (ref 0–37)
BUN: 10 mg/dL (ref 6–23)
Calcium: 9.2 mg/dL (ref 8.4–10.5)
Chloride: 105 mEq/L (ref 96–112)
Creat: 0.94 mg/dL (ref 0.50–1.10)
GFR, Est African American: 85 mL/min
GFR, Est Non African American: 74 mL/min
Glucose, Bld: 78 mg/dL (ref 70–99)

## 2011-11-10 LAB — CBC
HCT: 39.9 % (ref 36.0–46.0)
MCV: 86.4 fL (ref 78.0–100.0)
RDW: 14.3 % (ref 11.5–15.5)
WBC: 4.6 10*3/uL (ref 4.0–10.5)

## 2011-11-10 LAB — LIPID PANEL
Cholesterol: 193 mg/dL (ref 0–200)
Triglycerides: 121 mg/dL (ref <150)
VLDL: 24 mg/dL (ref 0–40)

## 2011-11-11 LAB — T-HELPER CELL (CD4) - (RCID CLINIC ONLY): CD4 % Helper T Cell: 55 % (ref 33–55)

## 2011-11-23 ENCOUNTER — Ambulatory Visit (INDEPENDENT_AMBULATORY_CARE_PROVIDER_SITE_OTHER): Payer: BC Managed Care – PPO | Admitting: Infectious Diseases

## 2011-11-23 ENCOUNTER — Encounter: Payer: Self-pay | Admitting: Infectious Diseases

## 2011-11-23 VITALS — BP 142/93 | HR 88 | Temp 97.5°F | Ht 67.0 in | Wt 181.1 lb

## 2011-11-23 DIAGNOSIS — F419 Anxiety disorder, unspecified: Secondary | ICD-10-CM

## 2011-11-23 DIAGNOSIS — B20 Human immunodeficiency virus [HIV] disease: Secondary | ICD-10-CM

## 2011-11-23 DIAGNOSIS — G43809 Other migraine, not intractable, without status migrainosus: Secondary | ICD-10-CM

## 2011-11-23 DIAGNOSIS — F411 Generalized anxiety disorder: Secondary | ICD-10-CM

## 2011-11-23 MED ORDER — ALPRAZOLAM 0.25 MG PO TABS: 0.2500 mg | ORAL_TABLET | Freq: Three times a day (TID) | ORAL | Status: DC | PRN

## 2011-11-23 MED ORDER — SUMATRIPTAN SUCCINATE 100 MG PO TABS
100.0000 mg | ORAL_TABLET | Freq: Once | ORAL | Status: DC | PRN
Start: 2011-11-23 — End: 2013-02-18

## 2011-11-23 NOTE — Assessment & Plan Note (Signed)
Uses xanax sparingly. Will refill.

## 2011-11-23 NOTE — Progress Notes (Signed)
  Subjective:    Patient ID: [name], female    DOB: 1966-05-24, 46 y.o.   MRN: [id number]  HPI 47 yo F with hx of HIV+, was previously on TRV/NFV then switched to complera for simplicity. 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 Is glad to be back on old rx. Had mammo and pap 10-2011. The cause of her iritis was ?  HIV 1 RNA Quant (copies/mL)  Date Value  11/09/2011 <20   05/30/2011 <20   04/18/2011 <20      CD4 T Cell Abs (cmm)  Date Value  11/09/2011 1090   05/30/2011 1050   04/18/2011 1040       Review of Systems  Constitutional: Negative for appetite change and unexpected weight change.  Gastrointestinal: Negative for nausea and diarrhea.  Genitourinary: Negative for dysuria.  Neurological: Negative for headaches.       Objective:   Physical Exam        Assessment & Plan:

## 2011-11-23 NOTE — Assessment & Plan Note (Signed)
She is doing very well. Will try to start exercising. Watch diet. Use condoms. rtc in 6 months.

## 2012-04-21 IMAGING — MG MM DIGITAL SCREENING {[facility]}
4 series · 4 of 4 positions shown · non-contrast
Comparison: none

DG SCREEN MAMMOGRAM BILATERAL
Bilateral CC and MLO view(s) were taken.

DIGITAL SCREENING MAMMOGRAM WITH CAD:
The breast tissue is almost entirely fatty.  Microcalcifications are present in the left breast.  
Characterization with magnification views is recommended.  No mass or malignant type calcifications
are identified in the right breast.  Compared with prior studies.
Images were processed with CAD.

[R CC]
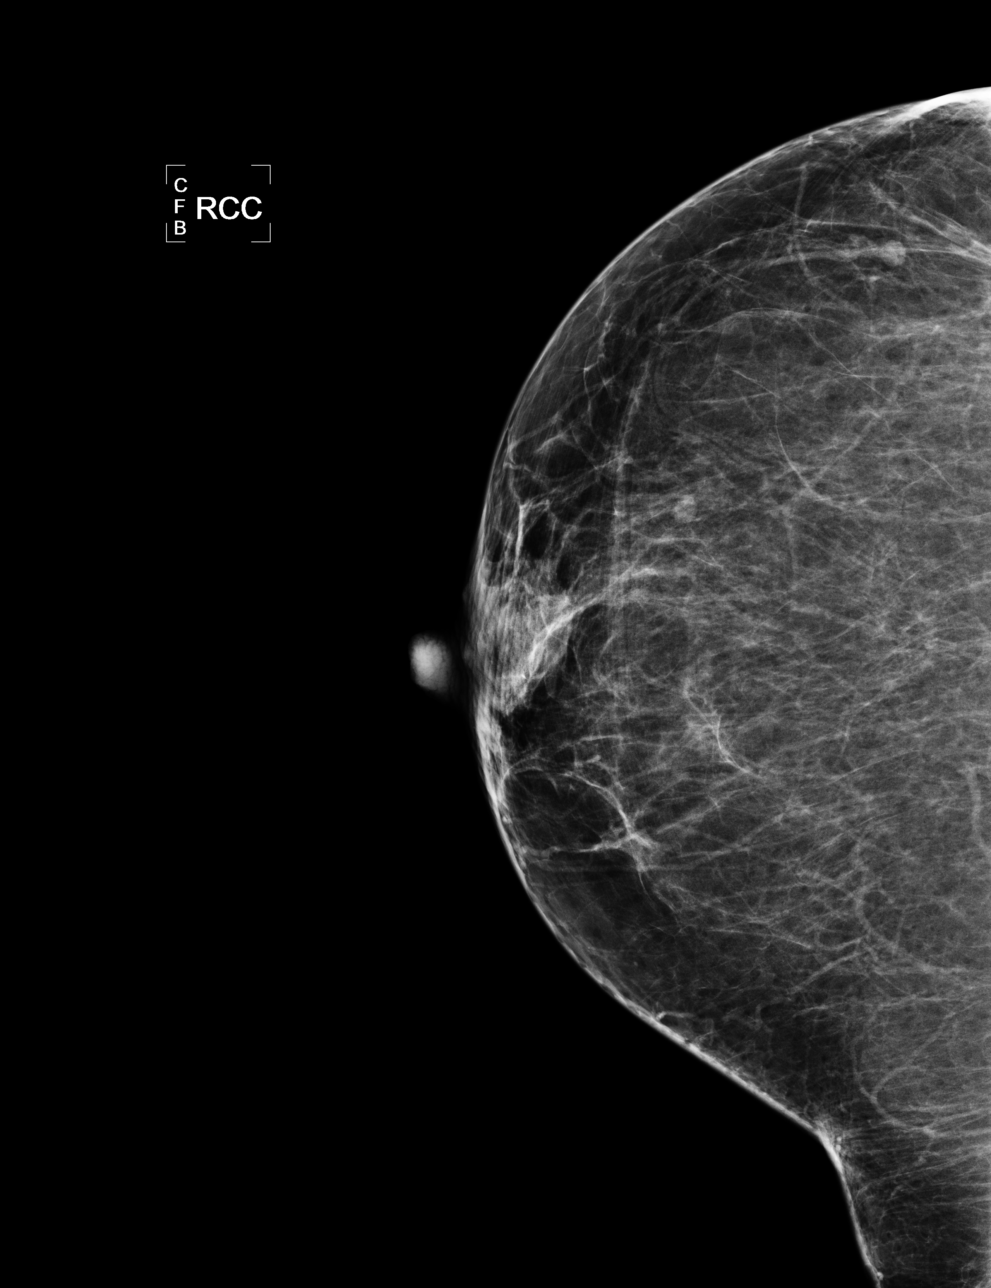

[L CC]
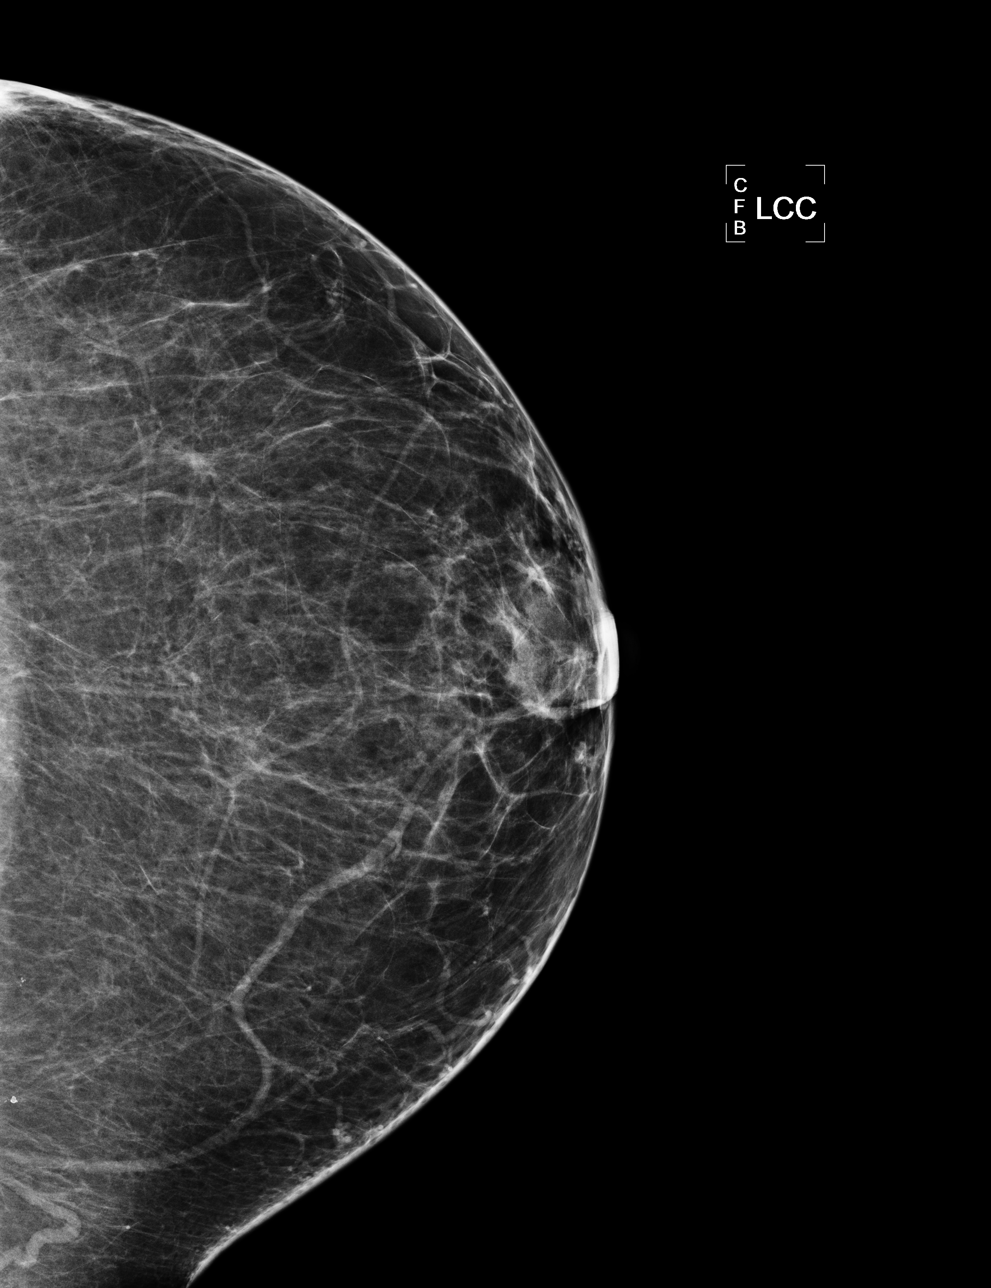

[L MLO]
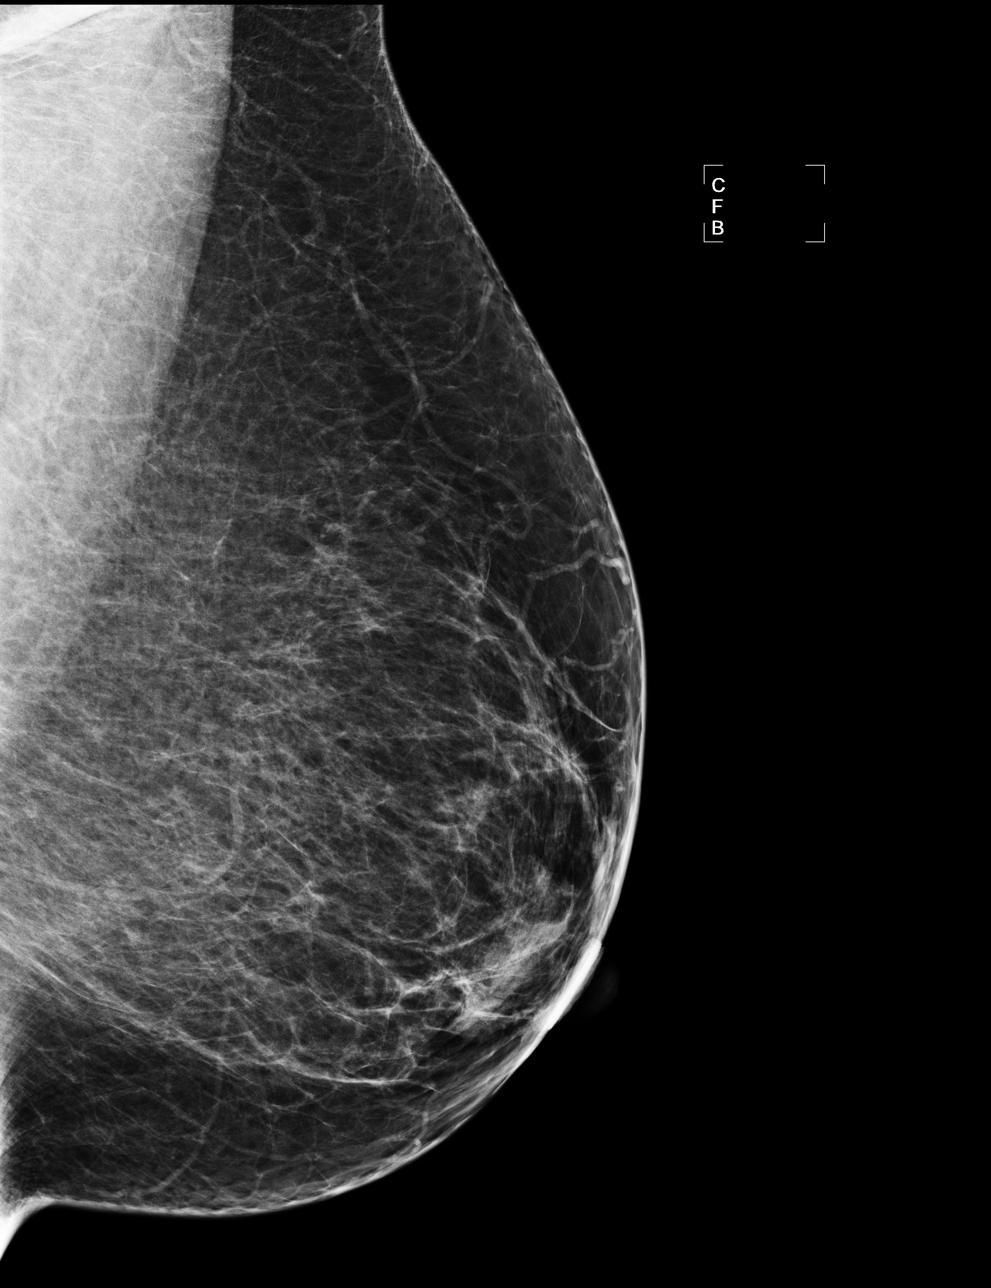

[R MLO]
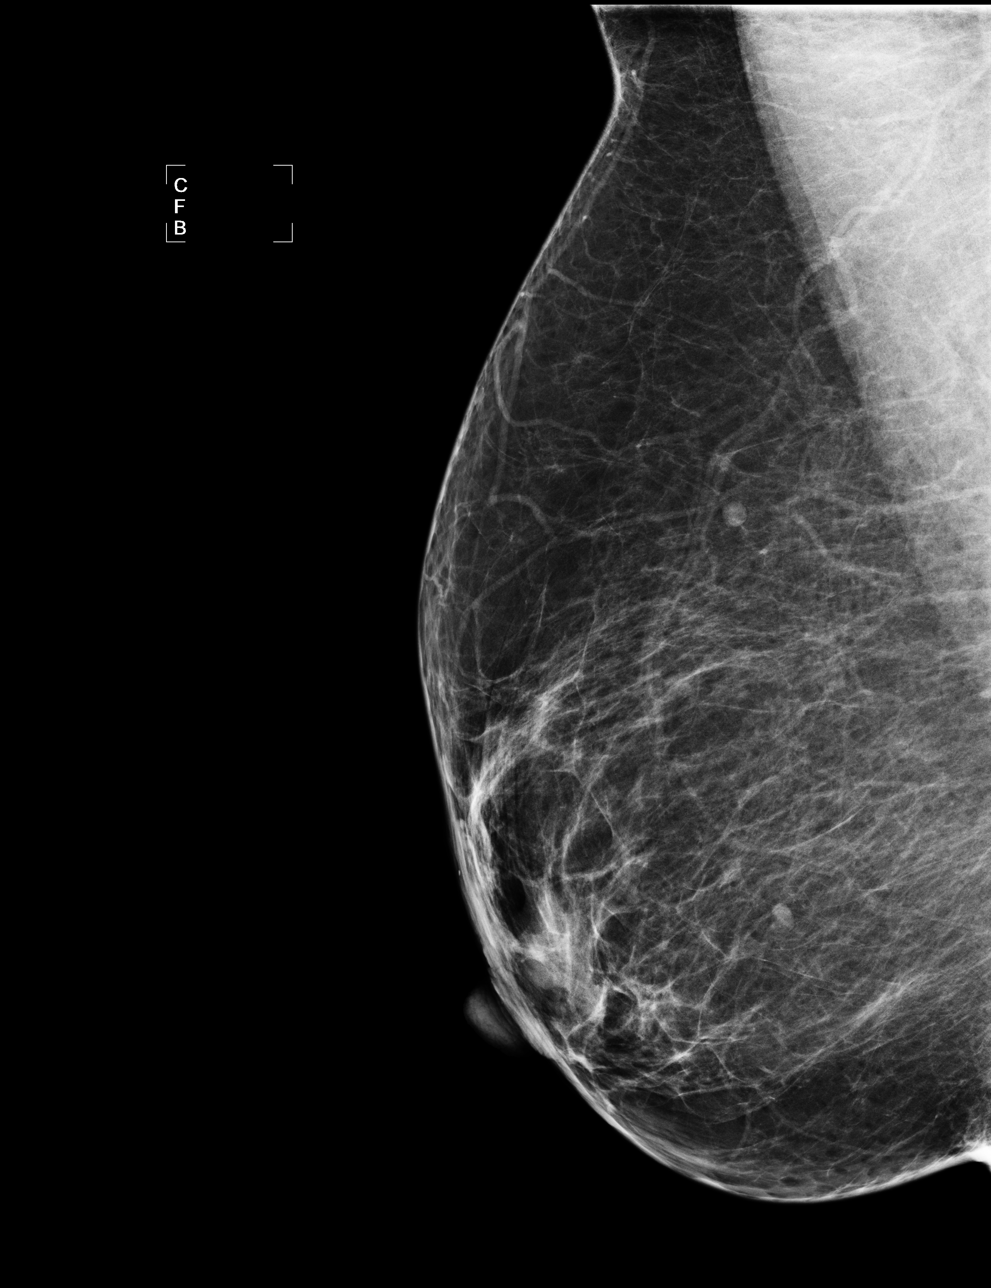

[4 of 4 positions shown; findings below may reference images not displayed]

IMPRESSION: Calcifications, left breast.  Additional evaluation is indicated. The patient will be contacted for
additional studies and a supplementary report will follow.  No specific mammographic evidence of 
malignancy, right breast.

ASSESSMENT: Need additional imaging evaluation and/or prior mammograms for comparison - BI-RADS 0

Further imaging of the left breast.
,

## 2012-08-17 ENCOUNTER — Other Ambulatory Visit: Payer: Self-pay | Admitting: Obstetrics & Gynecology

## 2012-08-17 DIAGNOSIS — Z1231 Encounter for screening mammogram for malignant neoplasm of breast: Secondary | ICD-10-CM

## 2012-08-22 ENCOUNTER — Other Ambulatory Visit: Payer: Self-pay | Admitting: *Deleted

## 2012-08-22 ENCOUNTER — Encounter: Payer: Self-pay | Admitting: *Deleted

## 2012-08-22 DIAGNOSIS — F411 Generalized anxiety disorder: Secondary | ICD-10-CM

## 2012-08-22 DIAGNOSIS — F419 Anxiety disorder, unspecified: Secondary | ICD-10-CM

## 2012-08-22 MED ORDER — ALPRAZOLAM 0.25 MG PO TABS: 0.2500 mg | ORAL_TABLET | Freq: Three times a day (TID) | ORAL | Status: DC | PRN

## 2012-08-22 NOTE — Telephone Encounter (Signed)
Requesting Xanax refill. 

## 2012-09-13 ENCOUNTER — Other Ambulatory Visit: Payer: Self-pay | Admitting: *Deleted

## 2012-09-13 DIAGNOSIS — B20 Human immunodeficiency virus [HIV] disease: Secondary | ICD-10-CM

## 2012-09-13 MED ORDER — EMTRICITABINE-TENOFOVIR DF 200-300 MG PO TABS: 1.0000 | ORAL_TABLET | Freq: Every day | ORAL | Status: DC

## 2012-09-18 ENCOUNTER — Other Ambulatory Visit: Payer: Self-pay | Admitting: Licensed Clinical Social Worker

## 2012-09-18 DIAGNOSIS — B20 Human immunodeficiency virus [HIV] disease: Secondary | ICD-10-CM

## 2012-09-18 MED ORDER — EMTRICITABINE-TENOFOVIR DF 200-300 MG PO TABS: 1.0000 | ORAL_TABLET | Freq: Every day | ORAL | Status: DC

## 2012-09-24 ENCOUNTER — Telehealth: Payer: Self-pay | Admitting: *Deleted

## 2012-09-24 ENCOUNTER — Other Ambulatory Visit (INDEPENDENT_AMBULATORY_CARE_PROVIDER_SITE_OTHER): Payer: BC Managed Care – PPO

## 2012-09-24 DIAGNOSIS — B20 Human immunodeficiency virus [HIV] disease: Secondary | ICD-10-CM

## 2012-09-24 LAB — COMPREHENSIVE METABOLIC PANEL
Albumin: 4.2 g/dL (ref 3.5–5.2)
BUN: 11 mg/dL (ref 6–23)
CO2: 26 mEq/L (ref 19–32)
Calcium: 9.2 mg/dL (ref 8.4–10.5)
Chloride: 106 mEq/L (ref 96–112)
Creat: 0.99 mg/dL (ref 0.50–1.10)
Potassium: 4.3 mEq/L (ref 3.5–5.3)

## 2012-09-24 LAB — CBC
HCT: 37.8 % (ref 36.0–46.0)
Hemoglobin: 12.5 g/dL (ref 12.0–15.0)
MCV: 84.4 fL (ref 78.0–100.0)
RBC: 4.48 MIL/uL (ref 3.87–5.11)
WBC: 3.8 10*3/uL — ABNORMAL LOW (ref 4.0–10.5)

## 2012-09-24 NOTE — Telephone Encounter (Signed)
Left message.  Requested the pt call back with hysterectomy information.  Needing clarification about reason for hysterectomy and if the pt had at least one PAP smear following her hysterectomy. Pt returned call.  Is having PAP smears at Dr. Annamaria Helling office annually.  Pt stated that she had a "partial" hysterectomy.  She has one ovary and her cervix are still present.  RN requested that pt call Dr. Annamaria Helling office to request that a copy of her latest PAP smear be faxed to this office.  Pt stated that she would call her GYN to request PAP report be sent.

## 2012-09-25 LAB — HIV-1 RNA QUANT-NO REFLEX-BLD: HIV-1 RNA Quant, Log: <1.3 {Log} (ref <1.30)

## 2012-09-25 LAB — T-HELPER CELL (CD4) - (RCID CLINIC ONLY): CD4 T Cell Abs: 810 uL (ref 400–2700)

## 2012-10-08 ENCOUNTER — Ambulatory Visit: Payer: BC Managed Care – PPO | Admitting: Infectious Diseases

## 2012-10-22 ENCOUNTER — Encounter: Payer: Self-pay | Admitting: Infectious Diseases

## 2012-10-22 ENCOUNTER — Ambulatory Visit (INDEPENDENT_AMBULATORY_CARE_PROVIDER_SITE_OTHER): Payer: BC Managed Care – PPO | Admitting: Infectious Diseases

## 2012-10-22 VITALS — BP 145/99 | HR 74 | Temp 97.3°F | Ht 67.0 in | Wt 190.0 lb

## 2012-10-22 DIAGNOSIS — H2000 Unspecified acute and subacute iridocyclitis: Secondary | ICD-10-CM

## 2012-10-22 DIAGNOSIS — Z113 Encounter for screening for infections with a predominantly sexual mode of transmission: Secondary | ICD-10-CM

## 2012-10-22 DIAGNOSIS — F411 Generalized anxiety disorder: Secondary | ICD-10-CM

## 2012-10-22 DIAGNOSIS — B20 Human immunodeficiency virus [HIV] disease: Secondary | ICD-10-CM

## 2012-10-22 DIAGNOSIS — E881 Lipodystrophy, not elsewhere classified: Secondary | ICD-10-CM

## 2012-10-22 MED ORDER — DOLUTEGRAVIR SODIUM 50 MG PO TABS: 50.0000 mg | ORAL_TABLET | Freq: Every day | ORAL | Status: DC

## 2012-10-22 NOTE — Assessment & Plan Note (Signed)
Has f/u within next month. Etiology unclear.

## 2012-10-22 NOTE — Progress Notes (Signed)
  Subjective:    Patient ID: [name], female    DOB: April 30, 1966, 47 y.o.   MRN: [id number]  HPI 47 yo F with hx of HIV+, was previously on TRV/NFV then switched to complera for simplicity. 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 Had mammo and pap 10-2011. Has scheduled repeat 11-01-12. The cause of her iritis was ? Has f/u appt in March, no further problems.  No problems with ART. Has started exercising, trying to loose wt.     Review of Systems  Constitutional: Positive for unexpected weight change. Negative for fever, chills and appetite change.  Gastrointestinal: Negative for diarrhea and constipation.  Genitourinary: Negative for dysuria.       Objective:   Physical Exam  Constitutional: She appears well-developed and well-nourished.  HENT:  Mouth/Throat: No oropharyngeal exudate.  Eyes: EOM are normal. Pupils are equal, round, and reactive to light.  Neck: Neck supple.  Cardiovascular: Normal rate, regular rhythm and normal heart sounds.   Pulmonary/Chest: Effort normal and breath sounds normal.  Abdominal: Soft. Bowel sounds are normal. She exhibits no distension. There is no tenderness.  Lymphadenopathy:    She has no cervical adenopathy.          Assessment & Plan:

## 2012-10-22 NOTE — Assessment & Plan Note (Addendum)
We spoke at length about her med choices. 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 She is otherwise doing well. Will see her back in 3-4 months with labs prior. Has gotten flu shot. Has appt for pap. Offered/refused condoms. States her partner of 4 years has liver cancer, will re-screen her Hep C.

## 2012-10-22 NOTE — Assessment & Plan Note (Signed)
Spoke at length about changing meds, diet, exercise.

## 2012-10-22 NOTE — Assessment & Plan Note (Signed)
Improved with exercise. 

## 2012-11-01 ENCOUNTER — Ambulatory Visit: Payer: BC Managed Care – PPO

## 2012-11-01 ENCOUNTER — Ambulatory Visit
Admission: RE | Admit: 2012-11-01 | Discharge: 2012-11-01 | Disposition: A | Discharge status: Home / Self Care | Payer: BC Managed Care – PPO | Source: Ambulatory Visit | Attending: Obstetrics & Gynecology | Admitting: Obstetrics & Gynecology

## 2012-11-01 DIAGNOSIS — Z1231 Encounter for screening mammogram for malignant neoplasm of breast: Secondary | ICD-10-CM

## 2012-12-13 ENCOUNTER — Ambulatory Visit (INDEPENDENT_AMBULATORY_CARE_PROVIDER_SITE_OTHER): Payer: BC Managed Care – PPO | Admitting: Obstetrics & Gynecology

## 2012-12-13 ENCOUNTER — Encounter: Payer: Self-pay | Admitting: Obstetrics & Gynecology

## 2012-12-13 VITALS — BP 138/96 | HR 64 | Temp 96.7°F | Ht 66.5 in | Wt 185.0 lb

## 2012-12-13 DIAGNOSIS — Z01419 Encounter for gynecological examination (general) (routine) without abnormal findings: Secondary | ICD-10-CM

## 2012-12-13 NOTE — Patient Instructions (Signed)

## 2012-12-13 NOTE — Progress Notes (Signed)
Subjective:     [name] is a 47 y.o. female here for a routine exam.  Current complaints: none.  Personal health questionnaire reviewed: no.   Gynecologic History No LMP recorded. Patient has had a hysterectomy. Contraception: abstinence Last Pap: 10/20/2011. Results were: normal Last mammogram: 11/2011. Results were: normal  Obstetric History OB History   Grav Para Term Preterm Abortions TAB SAB Ect Mult Living                   The following portions of the patient's history were reviewed and updated as appropriate: allergies, current medications, past family history, past medical history, past social history, past surgical history and problem list.  Review of Systems Pertinent items are noted in HPI.    Objective:    General appearance: alert Breasts: normal appearance, no masses or tenderness Abdomen: soft, non-tender; bowel sounds normal; no masses,  no organomegaly Pelvic: cervix normal in appearance, external genitalia normal, no adnexal masses or tenderness, uterus surgically absent and vagina normal without discharge    Assessment:    Healthy female exam.    Plan:   Return when necessary

## 2012-12-18 LAB — PAP IG, CT-NG NAA, HPV HIGH-RISK: HPV DNA High Risk: NOT DETECTED

## 2013-01-14 ENCOUNTER — Encounter: Payer: Self-pay | Admitting: *Deleted

## 2013-01-24 ENCOUNTER — Other Ambulatory Visit: Payer: BC Managed Care – PPO

## 2013-01-30 ENCOUNTER — Other Ambulatory Visit: Payer: Self-pay | Admitting: Infectious Disease

## 2013-01-30 ENCOUNTER — Other Ambulatory Visit: Payer: BC Managed Care – PPO

## 2013-01-30 DIAGNOSIS — E881 Lipodystrophy, not elsewhere classified: Secondary | ICD-10-CM

## 2013-01-30 DIAGNOSIS — Z113 Encounter for screening for infections with a predominantly sexual mode of transmission: Secondary | ICD-10-CM

## 2013-01-30 DIAGNOSIS — B20 Human immunodeficiency virus [HIV] disease: Secondary | ICD-10-CM

## 2013-01-30 LAB — COMPLETE METABOLIC PANEL WITH GFR
AST: 16 U/L (ref 0–37)
Albumin: 4.1 g/dL (ref 3.5–5.2)
Alkaline Phosphatase: 97 U/L (ref 39–117)
Calcium: 9 mg/dL (ref 8.4–10.5)
Chloride: 106 mEq/L (ref 96–112)
GFR, Est Non African American: 66 mL/min
Glucose, Bld: 77 mg/dL (ref 70–99)
Potassium: 3.8 mEq/L (ref 3.5–5.3)
Sodium: 143 mEq/L (ref 135–145)
Total Protein: 7.1 g/dL (ref 6.0–8.3)

## 2013-01-30 LAB — LIPID PANEL
Total CHOL/HDL Ratio: 3.4 Ratio
VLDL: 14 mg/dL (ref 0–40)

## 2013-01-30 LAB — CBC
HCT: 40.4 % (ref 36.0–46.0)
Hemoglobin: 13.2 g/dL (ref 12.0–15.0)
MCHC: 32.7 g/dL (ref 30.0–36.0)
WBC: 4.9 10*3/uL (ref 4.0–10.5)

## 2013-01-30 LAB — RPR

## 2013-01-31 LAB — T-HELPER CELL (CD4) - (RCID CLINIC ONLY)
CD4 % Helper T Cell: 49 % (ref 33–55)
CD4 T Cell Abs: 840 uL (ref 400–2700)

## 2013-01-31 LAB — HIV-1 RNA QUANT-NO REFLEX-BLD
HIV 1 RNA Quant: <20 copies/mL (ref <20)
HIV-1 RNA Quant, Log: <1.3 {Log} (ref <1.30)

## 2013-02-11 ENCOUNTER — Ambulatory Visit: Payer: BC Managed Care – PPO | Admitting: Infectious Diseases

## 2013-02-18 ENCOUNTER — Ambulatory Visit (INDEPENDENT_AMBULATORY_CARE_PROVIDER_SITE_OTHER): Payer: BC Managed Care – PPO | Admitting: Infectious Diseases

## 2013-02-18 ENCOUNTER — Encounter: Payer: Self-pay | Admitting: Infectious Diseases

## 2013-02-18 VITALS — BP 143/98 | HR 70 | Temp 98.0°F | Ht 67.0 in | Wt 191.0 lb

## 2013-02-18 DIAGNOSIS — H2000 Unspecified acute and subacute iridocyclitis: Secondary | ICD-10-CM

## 2013-02-18 DIAGNOSIS — F419 Anxiety disorder, unspecified: Secondary | ICD-10-CM

## 2013-02-18 DIAGNOSIS — G43809 Other migraine, not intractable, without status migrainosus: Secondary | ICD-10-CM

## 2013-02-18 DIAGNOSIS — B20 Human immunodeficiency virus [HIV] disease: Secondary | ICD-10-CM

## 2013-02-18 DIAGNOSIS — G43909 Migraine, unspecified, not intractable, without status migrainosus: Secondary | ICD-10-CM

## 2013-02-18 DIAGNOSIS — F411 Generalized anxiety disorder: Secondary | ICD-10-CM

## 2013-02-18 DIAGNOSIS — I10 Essential (primary) hypertension: Secondary | ICD-10-CM

## 2013-02-18 MED ORDER — SUMATRIPTAN SUCCINATE 100 MG PO TABS
100.0000 mg | ORAL_TABLET | Freq: Once | ORAL | Status: DC | PRN
Start: 2013-02-18 — End: 2017-08-10

## 2013-02-18 MED ORDER — ALPRAZOLAM 0.25 MG PO TABS: 0.2500 mg | ORAL_TABLET | Freq: Three times a day (TID) | ORAL | Status: DC | PRN

## 2013-02-18 NOTE — Assessment & Plan Note (Signed)
She is borderline today. Will continue to watch.

## 2013-02-18 NOTE — Assessment & Plan Note (Signed)
Takes xanax sparingly, will refill.

## 2013-02-18 NOTE — Assessment & Plan Note (Signed)
Appears to have resolved. She has had ophtho f/u.

## 2013-02-18 NOTE — Assessment & Plan Note (Signed)
She's doing very well. encouraged her to diet and exercise. Offered/refused condoms. vax are up to date, hep A/B immune. Will see her back in 6 months.

## 2013-02-18 NOTE — Assessment & Plan Note (Signed)
She uses imitrex sparingly, will refill and continue to watch.

## 2013-02-18 NOTE — Progress Notes (Signed)
  Subjective:    Patient ID: [name], female    DOB: 1966-02-10, 47 y.o.   MRN: [id number]  HPI 47 yo F with hx of HIV+, was previously on TRV/NFV then switched to complera for simplicity. 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 In February was changed to DTGV/TRV. Taking these without difficulty.  Had mammo and repeat April 2014 (-).  The cause of her iritis was ?  HIV 1 RNA Quant (copies/mL)  Date Value  01/30/2013 <20   09/24/2012 <20   11/09/2011 <20      CD4 T Cell Abs (cmm)  Date Value  01/30/2013 840   09/24/2012 810   11/09/2011 1090       Review of Systems  Eyes: Negative for visual disturbance.       Objective:   Physical Exam  Constitutional: She appears well-developed and well-nourished.  HENT:  Mouth/Throat: No oropharyngeal exudate.  Eyes: EOM are normal. Pupils are equal, round, and reactive to light.  Neck: Neck supple.  Cardiovascular: Normal rate, regular rhythm and normal heart sounds.   Pulmonary/Chest: Effort normal and breath sounds normal.  Abdominal: Soft. Bowel sounds are normal. There is no tenderness. There is no rebound.  Musculoskeletal: She exhibits no edema.  lipodystrophy  Lymphadenopathy:    She has no cervical adenopathy.          Assessment & Plan:

## 2013-05-13 ENCOUNTER — Telehealth: Payer: Self-pay | Admitting: *Deleted

## 2013-05-13 NOTE — Telephone Encounter (Signed)
Patient called c/o fatigue and requesting lab work. Offered her an appt with one of our clinic MDs for this Thursday 05/15/13 and she declined. She said she just started taking vitamins and if no improvement she would call back. She thought  her CBC was low last visit, but that was actually in January, her last CBC May 2014 was in the normal range. Wendall Mola

## 2013-07-11 ENCOUNTER — Other Ambulatory Visit: Payer: Self-pay

## 2013-07-30 ENCOUNTER — Other Ambulatory Visit: Payer: Self-pay

## 2013-07-30 MED ORDER — DOLUTEGRAVIR SODIUM 50 MG PO TABS: 50.0000 mg | ORAL_TABLET | Freq: Every day | ORAL | Status: DC

## 2013-11-11 ENCOUNTER — Other Ambulatory Visit: Payer: Self-pay | Admitting: *Deleted

## 2013-11-11 DIAGNOSIS — B20 Human immunodeficiency virus [HIV] disease: Secondary | ICD-10-CM

## 2013-11-11 MED ORDER — EMTRICITABINE-TENOFOVIR DF 200-300 MG PO TABS: 1.0000 | ORAL_TABLET | Freq: Every day | ORAL | Status: DC

## 2013-11-25 ENCOUNTER — Other Ambulatory Visit: Payer: Self-pay

## 2013-11-25 DIAGNOSIS — Z1231 Encounter for screening mammogram for malignant neoplasm of breast: Secondary | ICD-10-CM

## 2013-12-02 ENCOUNTER — Other Ambulatory Visit: Payer: BC Managed Care – PPO

## 2013-12-02 ENCOUNTER — Telehealth: Payer: Self-pay | Admitting: *Deleted

## 2013-12-02 NOTE — Telephone Encounter (Signed)
Pt wakes up in the middle of the night with her heart racing most nights of the week.  Occasionally during the day as well, but not as noticeable as during the night.  Patient states her blood pressure has been high.   Pt acknowledges that she has been under pressure at work.  Pt hesitant to try xanax, states that she noticed some swelling around her eyes last time she took it.  Pt cannot come into clinic this week d/t work constraints; rescheduled for 4/27. RN encouraged pt to establish primary care to address anxiety and blood pressure, gave her phone numbers of local MDs.  Pt will keep track of her blood pressure on her own in the mean time and call us if there are future problems. Andree CossHowell, Myliah Medel M, RN

## 2013-12-04 ENCOUNTER — Other Ambulatory Visit: Payer: BC Managed Care – PPO

## 2013-12-16 ENCOUNTER — Ambulatory Visit: Payer: BC Managed Care – PPO | Admitting: Infectious Diseases

## 2013-12-16 ENCOUNTER — Other Ambulatory Visit: Payer: BC Managed Care – PPO

## 2013-12-16 DIAGNOSIS — B20 Human immunodeficiency virus [HIV] disease: Secondary | ICD-10-CM

## 2013-12-16 LAB — COMPLETE METABOLIC PANEL WITH GFR
ALT: 10 U/L (ref 0–35)
AST: 18 U/L (ref 0–37)
Albumin: 4.1 g/dL (ref 3.5–5.2)
Alkaline Phosphatase: 101 U/L (ref 39–117)
BUN: 12 mg/dL (ref 6–23)
CALCIUM: 9.4 mg/dL (ref 8.4–10.5)
CHLORIDE: 108 meq/L (ref 96–112)
CO2: 27 meq/L (ref 19–32)
Creat: 1.08 mg/dL (ref 0.50–1.10)
GFR, Est African American: 71 mL/min
GFR, Est Non African American: 61 mL/min
Glucose, Bld: 79 mg/dL (ref 70–99)
POTASSIUM: 4 meq/L (ref 3.5–5.3)
Sodium: 144 mEq/L (ref 135–145)
Total Bilirubin: 0.3 mg/dL (ref 0.2–1.2)
Total Protein: 6.8 g/dL (ref 6.0–8.3)

## 2013-12-16 LAB — CBC WITH DIFFERENTIAL/PLATELET
Basophils Absolute: 0 10*3/uL (ref 0.0–0.1)
Basophils Relative: 0 % (ref 0–1)
EOS ABS: 0.1 10*3/uL (ref 0.0–0.7)
Eosinophils Relative: 1 % (ref 0–5)
HEMATOCRIT: 38.9 % (ref 36.0–46.0)
HEMOGLOBIN: 12.6 g/dL (ref 12.0–15.0)
LYMPHS ABS: 2 10*3/uL (ref 0.7–4.0)
Lymphocytes Relative: 39 % (ref 12–46)
MCH: 27.5 pg (ref 26.0–34.0)
MCHC: 32.4 g/dL (ref 30.0–36.0)
MCV: 84.9 fL (ref 78.0–100.0)
MONO ABS: 0.4 10*3/uL (ref 0.1–1.0)
Monocytes Relative: 7 % (ref 3–12)
NEUTROS PCT: 53 % (ref 43–77)
Neutro Abs: 2.7 10*3/uL (ref 1.7–7.7)
Platelets: 292 10*3/uL (ref 150–400)
RBC: 4.58 MIL/uL (ref 3.87–5.11)
RDW: 15.5 % (ref 11.5–15.5)
WBC: 5 10*3/uL (ref 4.0–10.5)

## 2013-12-17 LAB — T-HELPER CELL (CD4) - (RCID CLINIC ONLY)
CD4 % Helper T Cell: 50 % (ref 33–55)
CD4 T CELL ABS: 1080 /uL (ref 400–2700)

## 2013-12-18 LAB — HIV-1 RNA QUANT-NO REFLEX-BLD: HIV-1 RNA Quant, Log: <1.3 {Log} (ref <1.30)

## 2013-12-30 ENCOUNTER — Ambulatory Visit
Admission: RE | Admit: 2013-12-30 | Discharge: 2013-12-30 | Disposition: A | Discharge status: Home / Self Care | Payer: Self-pay | Source: Ambulatory Visit

## 2013-12-30 ENCOUNTER — Encounter: Payer: Self-pay | Admitting: Infectious Diseases

## 2013-12-30 ENCOUNTER — Ambulatory Visit (INDEPENDENT_AMBULATORY_CARE_PROVIDER_SITE_OTHER): Payer: BC Managed Care – PPO | Admitting: Infectious Diseases

## 2013-12-30 VITALS — Temp 97.7°F | Wt 187.0 lb

## 2013-12-30 DIAGNOSIS — I10 Essential (primary) hypertension: Secondary | ICD-10-CM

## 2013-12-30 DIAGNOSIS — B20 Human immunodeficiency virus [HIV] disease: Secondary | ICD-10-CM

## 2013-12-30 DIAGNOSIS — Z113 Encounter for screening for infections with a predominantly sexual mode of transmission: Secondary | ICD-10-CM

## 2013-12-30 DIAGNOSIS — Z1231 Encounter for screening mammogram for malignant neoplasm of breast: Secondary | ICD-10-CM

## 2013-12-30 DIAGNOSIS — Z79899 Other long term (current) drug therapy: Secondary | ICD-10-CM

## 2013-12-30 NOTE — Assessment & Plan Note (Signed)
She is doing very well. Offered/refused condoms. She is up to date on her vax. [facility].  Will see her back in 6 months, rpr and lipids.

## 2013-12-30 NOTE — Assessment & Plan Note (Signed)
Will send her to pcp. She is asx (has headaches from her migraines already).

## 2013-12-30 NOTE — Progress Notes (Signed)
   Subjective:    Patient ID: [name], female    DOB: [birth year], 48 y.o.   MRN: [id number]  HPI 48 yo F with hx of HIV+, was previously on TRV/NFV then switched to complera for simplicity. 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 In February 2013was changed to DTGV/TRV. Worried that her BP is up. Has been having racing heart awakening her at night. Otilio CarpenOccas has during the day, has been stressed as work. Drinks 1 cup of coffee/day. Has been exercising at gym. Has taken HCTZ 20+ yr ago. Improved with stress reduction.  Taking benadryl at night, no pseudofed. No claritin.   HIV 1 RNA Quant (copies/mL)  Date Value  12/16/2013 <20   01/30/2013 <20   09/24/2012 <20      CD4 T Cell Abs (/uL)  Date Value  12/16/2013 1080   01/30/2013 840   09/24/2012 810     Review of Systems  Constitutional: Negative for appetite change and unexpected weight change.  HENT: Positive for postnasal drip.   Respiratory: Positive for cough.   Cardiovascular: Positive for palpitations. Negative for chest pain.  Neurological: Positive for headaches.       Objective:   Physical Exam  Constitutional: She appears well-developed and well-nourished.  HENT:  Mouth/Throat: No oropharyngeal exudate.  Eyes: EOM are normal. Pupils are equal, round, and reactive to light.  Neck: Neck supple.  Cardiovascular: Normal rate, regular rhythm and normal heart sounds.   Pulmonary/Chest: Effort normal and breath sounds normal.  Abdominal: Soft. Bowel sounds are normal. She exhibits no distension. There is no tenderness.  Lymphadenopathy:    She has no cervical adenopathy.          Assessment & Plan:

## 2014-01-06 ENCOUNTER — Ambulatory Visit (INDEPENDENT_AMBULATORY_CARE_PROVIDER_SITE_OTHER): Payer: BC Managed Care – PPO | Admitting: Family

## 2014-01-06 ENCOUNTER — Encounter: Payer: Self-pay | Admitting: Family

## 2014-01-06 VITALS — HR 87 | Temp 98.3°F | Ht 66.5 in | Wt 187.0 lb

## 2014-01-06 DIAGNOSIS — G43909 Migraine, unspecified, not intractable, without status migrainosus: Secondary | ICD-10-CM

## 2014-01-06 DIAGNOSIS — B20 Human immunodeficiency virus [HIV] disease: Secondary | ICD-10-CM

## 2014-01-06 DIAGNOSIS — R079 Chest pain, unspecified: Secondary | ICD-10-CM

## 2014-01-06 DIAGNOSIS — G4700 Insomnia, unspecified: Secondary | ICD-10-CM

## 2014-01-06 DIAGNOSIS — F411 Generalized anxiety disorder: Secondary | ICD-10-CM

## 2014-01-06 MED ORDER — HYDROCHLOROTHIAZIDE 12.5 MG PO TABS: 25.0000 mg | ORAL_TABLET | Freq: Every day | ORAL | Status: DC

## 2014-01-06 MED ORDER — BUPROPION HCL ER (XL) 150 MG PO TB24: 150.0000 mg | ORAL_TABLET | Freq: Every day | ORAL | Status: DC

## 2014-01-06 NOTE — Progress Notes (Signed)
Pre visit review using our clinic review tool, if applicable. No additional management support is needed unless otherwise documented below in the visit note. 

## 2014-01-06 NOTE — Patient Instructions (Signed)
Sodium-Controlled Diet Sodium is a mineral. It is found in many foods. Sodium may be found naturally or added during the making of a food. The most common form of sodium is salt, which is made up of sodium and chloride. Reducing your sodium intake involves changing your eating habits. The following guidelines will help you reduce the sodium in your diet:  Stop using the salt shaker.  Use salt sparingly in cooking and baking.  Substitute with sodium-free seasonings and spices.  Do not use a salt substitute (potassium chloride) without your caregiver's permission.  Include a variety of fresh, unprocessed foods in your diet.  Limit the use of processed and convenience foods that are high in sodium. USE THE FOLLOWING FOODS SPARINGLY: Breads/Starches  Commercial bread stuffing, commercial pancake or waffle mixes, coating mixes. Waffles. Croutons. Prepared (boxed or frozen) potato, rice, or noodle mixes that contain salt or sodium. Salted French fries or hash browns. Salted popcorn, breads, crackers, chips, or snack foods. Vegetables  Vegetables canned with salt or prepared in cream, butter, or cheese sauces. Sauerkraut. Tomato or vegetable juices canned with salt.  Fresh vegetables are allowed if rinsed thoroughly. Fruit  Fruit is okay to eat. Meat and Meat Substitutes  Salted or smoked meats, such as bacon or Canadian bacon, chipped or corned beef, hot dogs, salt pork, luncheon meats, pastrami, ham, or sausage. Canned or smoked fish, poultry, or meat. Processed cheese or cheese spreads, blue or Roquefort cheese. Battered or frozen fish products. Prepared spaghetti sauce. Baked beans. Reuben sandwiches. Salted nuts. Caviar. Milk  Limit buttermilk to 1 cup per week. Soups and Combination Foods  Bouillon cubes, canned or dried soups, broth, consomm. Convenience (frozen or packaged) dinners with more than 600 mg sodium. Pot pies, pizza, Asian food, fast food cheeseburgers, and specialty  sandwiches. Desserts and Sweets  Regular (salted) desserts, pie, commercial fruit snack pies, commercial snack cakes, canned puddings.  Eat desserts and sweets in moderation. Fats and Oils  Gravy mixes or canned gravy. No more than 1 to 2 tbs of salad dressing. Chip dips.  Eat fats and oils in moderation. Beverages  See those listed under the vegetables and milk groups. Condiments  Ketchup, mustard, meat sauces, salsa, regular (salted) and lite soy sauce or mustard. Dill pickles, olives, meat tenderizer. Prepared horseradish or pickle relish. Dutch-processed cocoa. Baking powder or baking soda used medicinally. Worcestershire sauce. "Light" salt. Salt substitute, unless approved by your caregiver. Document Released: 02/11/2002 Document Revised: 11/14/2011 Document Reviewed: 09/14/2009 Exit[employer] Patient Information 2014 [employer].  
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
## 2014-01-09 ENCOUNTER — Telehealth: Payer: Self-pay | Admitting: Family

## 2014-01-09 NOTE — Telephone Encounter (Signed)
Patient Information:  Caller Name: [name]  Phone: [phone]  Patient: [name], [name]  Gender: Female  DOB: Feb 09, 1966  Age: 48 Years  PCP: Adline Mangoampbell, [facility](Family Practice)  Pregnant: No  Office Follow Up:  Does the office need to follow up with this patient?: Yes  Instructions For The Office: Please f/u with pt to advise per recommendation per provider. Thank you.   Symptoms  Reason For Call & Symptoms: Pt reports she is allergic to sulfa, which she is and  on HCTZ.Marland Kitchen. Pt reports she stated HCTZ [place] 01/07/14.  Pt has no symptoms of allergic. Pt states the paper work that is the the medication advised she call her provider to advise.  Reviewed Health History In EMR: Yes  Reviewed Medications In EMR: Yes  Reviewed Allergies In EMR: Yes  Reviewed Surgeries / Procedures: Yes  Date of Onset of Symptoms: 01/07/2014 OB / GYN:  LMP: Unknown  Guideline(s) Used:  No Protocol Available - Information Only  Disposition Per Guideline:   Discuss with PCP and Callback by Nurse Today  Reason For Disposition Reached:   Nursing judgment  Advice Given:  Call Back If:  New symptoms develop  Patient Will Follow Care Advice:  YES

## 2014-01-09 NOTE — Telephone Encounter (Signed)
Pt is taking hctz and is allergic to sulfa. Pt would like to know should she continue to take med. Pt has had hives in past. Call has been transferred to CAN

## 2014-01-10 NOTE — Telephone Encounter (Signed)
Pt aware and says that she has taken it in the past and did fine but will keep taking it and keep an eye for hives. Advised pt to d/c if she begins to get hives

## 2014-01-10 NOTE — Telephone Encounter (Signed)
Their is a very small chance that she could have a reaction to HCTZ if she is allergic to sulfa. However, since concerns have arisen, discontinue HCTZ.

## 2014-01-10 NOTE — Telephone Encounter (Signed)
See CAN note 

## 2014-01-21 ENCOUNTER — Encounter: Payer: Self-pay | Admitting: *Deleted

## 2014-01-28 ENCOUNTER — Ambulatory Visit: Payer: BC Managed Care – PPO | Admitting: Cardiology

## 2014-02-07 ENCOUNTER — Ambulatory Visit (INDEPENDENT_AMBULATORY_CARE_PROVIDER_SITE_OTHER): Payer: BC Managed Care – PPO | Admitting: Family

## 2014-02-07 ENCOUNTER — Encounter: Payer: Self-pay | Admitting: Family

## 2014-02-07 VITALS — BP 126/80 | HR 84 | Ht 66.5 in | Wt 181.2 lb

## 2014-02-07 DIAGNOSIS — I10 Essential (primary) hypertension: Secondary | ICD-10-CM

## 2014-02-07 DIAGNOSIS — B20 Human immunodeficiency virus [HIV] disease: Secondary | ICD-10-CM

## 2014-02-07 DIAGNOSIS — Z79899 Other long term (current) drug therapy: Secondary | ICD-10-CM

## 2014-02-07 DIAGNOSIS — Z23 Encounter for immunization: Secondary | ICD-10-CM

## 2014-02-07 DIAGNOSIS — Z0000 Encounter for general adult medical examination without abnormal findings: Secondary | ICD-10-CM

## 2014-02-07 LAB — COMPREHENSIVE METABOLIC PANEL
ALT: 15 U/L (ref 0–35)
AST: 22 U/L (ref 0–37)
Albumin: 3.9 g/dL (ref 3.5–5.2)
Alkaline Phosphatase: 83 U/L (ref 39–117)
BUN: 10 mg/dL (ref 6–23)
CO2: 30 mEq/L (ref 19–32)
Calcium: 9.2 mg/dL (ref 8.4–10.5)
Chloride: 107 mEq/L (ref 96–112)
Creatinine, Ser: 1.1 mg/dL (ref 0.4–1.2)
GFR: 66.19 mL/min (ref >60.00)
Glucose, Bld: 81 mg/dL (ref 70–99)
Potassium: 3.4 mEq/L — ABNORMAL LOW (ref 3.5–5.1)
Sodium: 142 mEq/L (ref 135–145)
Total Bilirubin: 0.3 mg/dL (ref 0.2–1.2)
Total Protein: 7.1 g/dL (ref 6.0–8.3)

## 2014-02-07 LAB — POCT URINALYSIS DIPSTICK
Bilirubin, UA: NEGATIVE
Blood, UA: NEGATIVE
Glucose, UA: NEGATIVE
Ketones, UA: NEGATIVE
Leukocytes, UA: NEGATIVE
Nitrite, UA: NEGATIVE
Protein, UA: NEGATIVE
Spec Grav, UA: 1.02
Urobilinogen, UA: 0.2
pH, UA: 6

## 2014-02-07 LAB — CBC WITH DIFFERENTIAL/PLATELET
BASOS ABS: 0 10*3/uL (ref 0.0–0.1)
Basophils Relative: 0.7 % (ref 0.0–3.0)
Eosinophils Absolute: 0.1 10*3/uL (ref 0.0–0.7)
Eosinophils Relative: 1.2 % (ref 0.0–5.0)
HCT: 40.8 % (ref 36.0–46.0)
HEMOGLOBIN: 13.1 g/dL (ref 12.0–15.0)
LYMPHS PCT: 42.2 % (ref 12.0–46.0)
Lymphs Abs: 1.8 10*3/uL (ref 0.7–4.0)
MCHC: 32 g/dL (ref 30.0–36.0)
MCV: 87.3 fl (ref 78.0–100.0)
MONOS PCT: 7.7 % (ref 3.0–12.0)
Monocytes Absolute: 0.3 10*3/uL (ref 0.1–1.0)
NEUTROS ABS: 2 10*3/uL (ref 1.4–7.7)
Neutrophils Relative %: 48.2 % (ref 43.0–77.0)
PLATELETS: 263 10*3/uL (ref 150.0–400.0)
RBC: 4.67 Mil/uL (ref 3.87–5.11)
RDW: 14.2 % (ref 11.5–15.5)
WBC: 4.2 10*3/uL (ref 4.0–10.5)

## 2014-02-07 LAB — LIPID PANEL
CHOLESTEROL: 151 mg/dL (ref 0–200)
HDL: 42 mg/dL (ref >39.00)
LDL Cholesterol: 98 mg/dL (ref 0–99)
NONHDL: 109
TRIGLYCERIDES: 55 mg/dL (ref 0.0–149.0)
Total CHOL/HDL Ratio: 4
VLDL: 11 mg/dL (ref 0.0–40.0)

## 2014-02-07 LAB — TSH: TSH: 1.45 u[IU]/mL (ref 0.35–4.50)

## 2014-02-07 NOTE — Progress Notes (Signed)
   Subjective:    Patient ID: [name], female    DOB: [birth year], 47 y.o.   MRN: [id number]  HPI  48 year old African American female, nonsmoker with a history of hypertension, anxiety, HIV is in today for complete physical exam.  This is a routine wellness  examination for this patient . I reviewed all health maintenance protocols including mammography, colonoscopy, bone density Needed referrals were placed. Age and diagnosis  appropriate screening labs were ordered. Her immunization history was reviewed and appropriate vaccinations were ordered. Her current medications and allergies were reviewed and needed refills of her chronic medications were ordered. The plan for yearly health maintenance was discussed all orders and referrals were made as appropriate.  Review of Systems  Constitutional: Negative.   HENT: Negative.   Eyes: Negative.   Respiratory: Negative.   Cardiovascular: Negative.   Gastrointestinal: Negative.   Endocrine: Negative.   Genitourinary: Negative.   Musculoskeletal: Negative.   Skin: Negative.   Allergic/Immunologic: Negative.   Neurological: Negative.   Hematological: Negative.   Psychiatric/Behavioral: Negative.        Objective:   Physical Exam  Constitutional: She is oriented to person, place, and time. She appears well-developed and well-nourished.  HENT:  Right Ear: External ear normal.  Nose: Nose normal.  Mouth/Throat: Oropharynx is clear and moist.  Eyes: Conjunctivae are normal. Pupils are equal, round, and reactive to light.  Neck: Normal range of motion. Neck supple. No thyromegaly present.  Cardiovascular: Normal rate, regular rhythm and normal heart sounds.   Pulmonary/Chest: Effort normal and breath sounds normal.  Abdominal: Soft. Bowel sounds are normal.  Musculoskeletal: Normal range of motion.  Neurological: She is alert and oriented to person, place, and time.  Skin: Skin is warm and dry.  Psychiatric: She has a normal mood  and affect.          Assessment & Plan:   Problem List Items Addressed This Visit   HIV DISEASE   HYPERTENSION    Other Visit Diagnoses   Routine general medical examination at a health care facility    -  Primary    Relevant Orders       EKG 12-Lead (Completed)       POC Urinalysis Dipstick       TSH    Need for prophylactic vaccination with combined diphtheria-tetanus-pertussis (DTP) vaccine        Relevant Orders       Tdap vaccine greater than or equal to 7yo IM (Completed)    Human immunodeficiency virus (HIV) disease        Relevant Orders       Comprehensive metabolic panel       CBC with Differential    Encounter for long-term (current) use of other medications        Relevant Orders       Lipid panel     Call the office with any questions or concerns. Recheck in 6 months and sooner as needed. See GYN as scheduled.

## 2014-02-07 NOTE — Patient Instructions (Signed)

## 2014-02-10 ENCOUNTER — Telehealth: Payer: Self-pay | Admitting: Family

## 2014-02-10 NOTE — Telephone Encounter (Signed)
Relevant patient education mailed to patient.  

## 2014-02-21 ENCOUNTER — Ambulatory Visit: Payer: BC Managed Care – PPO | Admitting: Cardiology

## 2014-03-17 ENCOUNTER — Other Ambulatory Visit: Payer: Self-pay | Admitting: *Deleted

## 2014-03-17 DIAGNOSIS — B20 Human immunodeficiency virus [HIV] disease: Secondary | ICD-10-CM

## 2014-03-17 MED ORDER — EMTRICITABINE-TENOFOVIR DF 200-300 MG PO TABS: 1.0000 | ORAL_TABLET | Freq: Every day | ORAL | Status: DC

## 2014-03-17 MED ORDER — DOLUTEGRAVIR SODIUM 50 MG PO TABS: 50.0000 mg | ORAL_TABLET | Freq: Every day | ORAL | Status: DC

## 2014-03-27 ENCOUNTER — Ambulatory Visit: Payer: BC Managed Care – PPO | Admitting: Internal Medicine

## 2014-06-23 ENCOUNTER — Other Ambulatory Visit: Payer: Self-pay

## 2014-06-23 DIAGNOSIS — Z1239 Encounter for other screening for malignant neoplasm of breast: Secondary | ICD-10-CM

## 2014-06-30 ENCOUNTER — Other Ambulatory Visit: Payer: BC Managed Care – PPO

## 2014-07-07 ENCOUNTER — Encounter: Payer: Self-pay | Admitting: Family

## 2014-07-14 ENCOUNTER — Ambulatory Visit: Payer: BC Managed Care – PPO | Admitting: Infectious Diseases

## 2014-07-16 ENCOUNTER — Ambulatory Visit: Payer: BC Managed Care – PPO | Admitting: Infectious Diseases

## 2014-07-21 ENCOUNTER — Other Ambulatory Visit (INDEPENDENT_AMBULATORY_CARE_PROVIDER_SITE_OTHER): Payer: BC Managed Care – PPO

## 2014-07-21 ENCOUNTER — Encounter: Payer: Self-pay | Admitting: Obstetrics & Gynecology

## 2014-07-21 ENCOUNTER — Ambulatory Visit (INDEPENDENT_AMBULATORY_CARE_PROVIDER_SITE_OTHER): Payer: BC Managed Care – PPO | Admitting: Obstetrics & Gynecology

## 2014-07-21 VITALS — Temp 98.0°F | Ht 66.5 in | Wt 187.0 lb

## 2014-07-21 DIAGNOSIS — Z01419 Encounter for gynecological examination (general) (routine) without abnormal findings: Secondary | ICD-10-CM

## 2014-07-21 DIAGNOSIS — N951 Menopausal and female climacteric states: Secondary | ICD-10-CM

## 2014-07-21 DIAGNOSIS — B20 Human immunodeficiency virus [HIV] disease: Secondary | ICD-10-CM

## 2014-07-21 DIAGNOSIS — Z113 Encounter for screening for infections with a predominantly sexual mode of transmission: Secondary | ICD-10-CM

## 2014-07-21 NOTE — Patient Instructions (Signed)
Menopause Menopause is the normal time of life when menstrual periods stop completely. Menopause is complete when you have missed 12 consecutive menstrual periods. It usually occurs between the ages of 48 years and 55 years. Very rarely does a woman develop menopause before the age of 40 years. At menopause, your ovaries stop producing the female hormones estrogen and progesterone. This can cause undesirable symptoms and also affect your health. Sometimes the symptoms may occur 4-5 years before the menopause begins. There is no relationship between menopause and:  Oral contraceptives.  Number of children you had.  Race.  The age your menstrual periods started (menarche). Heavy smokers and very thin women may develop menopause earlier in life. CAUSES  The ovaries stop producing the female hormones estrogen and progesterone.  Other causes include:  Surgery to remove both ovaries.  The ovaries stop functioning for no known reason.  Tumors of the pituitary gland in the brain.  Medical disease that affects the ovaries and hormone production.  Radiation treatment to the abdomen or pelvis.  Chemotherapy that affects the ovaries. SYMPTOMS   Hot flashes.  Night sweats.  Decrease in sex drive.  Vaginal dryness and thinning of the vagina causing painful intercourse.  Dryness of the skin and developing wrinkles.  Headaches.  Tiredness.  Irritability.  Memory problems.  Weight gain.  Bladder infections.  Hair growth of the face and chest.  Infertility. More serious symptoms include:  Loss of bone (osteoporosis) causing breaks (fractures).  Depression.  Hardening and narrowing of the arteries (atherosclerosis) causing heart attacks and strokes. DIAGNOSIS   When the menstrual periods have stopped for 12 straight months.  Physical exam.  Hormone studies of the blood. TREATMENT  There are many treatment choices and nearly as many questions about them. The  decisions to treat or not to treat menopausal changes is an individual choice made with your health care provider. Your health care provider can discuss the treatments with you. Together, you can decide which treatment will work best for you. Your treatment choices may include:   Hormone therapy (estrogen and progesterone).  Non-hormonal medicines.  Treating the individual symptoms with medicine (for example antidepressants for depression).  Herbal medicines that may help specific symptoms.  Counseling by a psychiatrist or psychologist.  Group therapy.  Lifestyle changes including:  Eating healthy.  Regular exercise.  Limiting caffeine and alcohol.  Stress management and meditation.  No treatment. HOME CARE INSTRUCTIONS   Take the medicine your health care provider gives you as directed.  Get plenty of sleep and rest.  Exercise regularly.  Eat a diet that contains calcium (good for the bones) and soy products (acts like estrogen hormone).  Avoid alcoholic beverages.  Do not smoke.  If you have hot flashes, dress in layers.  Take supplements, calcium, and vitamin D to strengthen bones.  You can use over-the-counter lubricants or moisturizers for vaginal dryness.  Group therapy is sometimes very helpful.  Acupuncture may be helpful in some cases. SEEK MEDICAL CARE IF:   You are not sure you are in menopause.  You are having menopausal symptoms and need advice and treatment.  You are still having menstrual periods after age 55 years.  You have pain with intercourse.  Menopause is complete (no menstrual period for 12 months) and you develop vaginal bleeding.  You need a referral to a specialist (gynecologist, psychiatrist, or psychologist) for treatment. SEEK IMMEDIATE MEDICAL CARE IF:   You have severe depression.  You have excessive vaginal bleeding.    You fell and think you have a broken bone.  You have pain when you urinate.  You develop leg or  chest pain.  You have a fast pounding heart beat (palpitations).  You have severe headaches.  You develop vision problems.  You feel a lump in your breast.  You have abdominal pain or severe indigestion. Document Released: 11/12/2003 Document Revised: 04/24/2013 Document Reviewed: 03/21/2013 [facility] Patient Information 2015 [place]. This information is not intended to replace advice given to you by your health care provider. Make sure you discuss any questions you have with your health care provider. Menopause Menopause is the normal time of life when menstrual periods stop completely. Menopause is complete when you have missed 12 consecutive menstrual periods. It usually occurs between the ages of 48 years and 55 years. Very rarely does a woman develop menopause before the age of 40 years. At menopause, your ovaries stop producing the female hormones estrogen and progesterone. This can cause undesirable symptoms and also affect your health. Sometimes the symptoms may occur 4-5 years before the menopause begins. There is no relationship between menopause and:  Oral contraceptives.  Number of children you had.  Race.  The age your menstrual periods started (menarche). Heavy smokers and very thin women may develop menopause earlier in life. CAUSES  The ovaries stop producing the female hormones estrogen and progesterone.  Other causes include:  Surgery to remove both ovaries.  The ovaries stop functioning for no known reason.  Tumors of the pituitary gland in the brain.  Medical disease that affects the ovaries and hormone production.  Radiation treatment to the abdomen or pelvis.  Chemotherapy that affects the ovaries. SYMPTOMS   Hot flashes.  Night sweats.  Decrease in sex drive.  Vaginal dryness and thinning of the vagina causing painful intercourse.  Dryness of the skin and developing wrinkles.  Headaches.  Tiredness.  Irritability.  Memory  problems.  Weight gain.  Bladder infections.  Hair growth of the face and chest.  Infertility. More serious symptoms include:  Loss of bone (osteoporosis) causing breaks (fractures).  Depression.  Hardening and narrowing of the arteries (atherosclerosis) causing heart attacks and strokes. DIAGNOSIS   When the menstrual periods have stopped for 12 straight months.  Physical exam.  Hormone studies of the blood. TREATMENT  There are many treatment choices and nearly as many questions about them. The decisions to treat or not to treat menopausal changes is an individual choice made with your health care provider. Your health care provider can discuss the treatments with you. Together, you can decide which treatment will work best for you. Your treatment choices may include:   Hormone therapy (estrogen and progesterone).  Non-hormonal medicines.  Treating the individual symptoms with medicine (for example antidepressants for depression).  Herbal medicines that may help specific symptoms.  Counseling by a psychiatrist or psychologist.  Group therapy.  Lifestyle changes including:  Eating healthy.  Regular exercise.  Limiting caffeine and alcohol.  Stress management and meditation.  No treatment. HOME CARE INSTRUCTIONS   Take the medicine your health care provider gives you as directed.  Get plenty of sleep and rest.  Exercise regularly.  Eat a diet that contains calcium (good for the bones) and soy products (acts like estrogen hormone).  Avoid alcoholic beverages.  Do not smoke.  If you have hot flashes, dress in layers.  Take supplements, calcium, and vitamin D to strengthen bones.  You can use over-the-counter lubricants or moisturizers for vaginal dryness.  Group therapy is sometimes very helpful.  Acupuncture may be helpful in some cases. SEEK MEDICAL CARE IF:   You are not sure you are in menopause.  You are having menopausal symptoms and  need advice and treatment.  You are still having menstrual periods after age 55 years.  You have pain with intercourse.  Menopause is complete (no menstrual period for 12 months) and you develop vaginal bleeding.  You need a referral to a specialist (gynecologist, psychiatrist, or psychologist) for treatment. SEEK IMMEDIATE MEDICAL CARE IF:   You have severe depression.  You have excessive vaginal bleeding.  You fell and think you have a broken bone.  You have pain when you urinate.  You develop leg or chest pain.  You have a fast pounding heart beat (palpitations).  You have severe headaches.  You develop vision problems.  You feel a lump in your breast.  You have abdominal pain or severe indigestion. Document Released: 11/12/2003 Document Revised: 04/24/2013 Document Reviewed: 03/21/2013 [employer] Patient Information 2015 [employer]. This information is not intended to replace advice given to you by your health care provider. Make sure you discuss any questions you have with your health care provider.  

## 2014-07-21 NOTE — Progress Notes (Signed)
Subjective:     [name] is a 48 y.o. female here for a routine exam.     Personal health questionnaire:  Is patient Ashkenazi Jewish, have a family history of breast and/or ovarian cancer: no Is there a family history of uterine cancer diagnosed at age < 8650, gastrointestinal cancer, urinary tract cancer, family member who is a Personnel officerLynch syndrome-associated carrier: no Is the patient overweight and hypertensive, family history of diabetes, personal history of gestational diabetes or PCOS: yes Is patient over 3855, have PCOS,  family history of premature CHD under age 48, diabetes, smoke, have hypertension or peripheral artery disease:  no At any time, has a partner hit, kicked or otherwise hurt or frightened you?: no Over the past 2 weeks, have you felt down, depressed or hopeless?: no Over the past 2 weeks, have you felt little interest or pleasure in doing things?:no   Gynecologic History No LMP recorded. Patient has had a hysterectomy. Last Pap results were: normal Last mammogram results were: normal  Obstetric History OB History  Gravida Para Term Preterm AB SAB TAB Ectopic Multiple Living  2 2 2       1     # Outcome Date GA Lbr Len/2nd Weight Sex Delivery Anes PTL Lv  2 Term 07/14/88 8989w0d   F Vag-Spont  N FD     Complications: Umbilical cord around fetal neck with cord compression  1 Term 02/02/87 2489w0d  3.43 kg (7 lb 9 oz) M Vag-Spont None N Y      Past Medical History  Diagnosis Date  . Migraine   . HIV (human immunodeficiency virus infection)     Past Surgical History  Procedure Laterality Date  . Abdominal hysterectomy Right 2004    partial    Current outpatient prescriptions: buPROPion (WELLBUTRIN XL) 150 MG 24 hr tablet, Take 1 tablet (150 mg total) by mouth daily., Disp: 30 tablet, Rfl: 3;  emtricitabine-tenofovir (TRUVADA) 200-300 MG per tablet, Take 1 tablet by mouth daily., Disp: 90 tablet, Rfl: 1;  hydrochlorothiazide (HYDRODIURIL) 12.5 MG tablet, Take 2  tablets (25 mg total) by mouth daily., Disp: 30 tablet, Rfl: 3 SUMAtriptan (IMITREX) 100 MG tablet, Take 1 tablet (100 mg total) by mouth once as needed., Disp: 10 tablet, Rfl: 3 Allergies  Allergen Reactions  . Sulfonamide Derivatives Hives    History  Substance Use Topics  . Smoking status: Never Smoker   . Smokeless tobacco: Never Used  . Alcohol Use: 0.0 oz/week    0 Not specified per week     Comment: socially    Family History  Problem Relation Age of Onset  . Diabetes    . Hypertension    . Brain cancer    . Cerebral aneurysm Father [id number]  . Aortic aneurysm Father       Review of Systems  Constitutional: negative for fatigue and weight loss Respiratory: negative for cough and wheezing Cardiovascular: negative for chest pain, fatigue and palpitations Gastrointestinal: negative for abdominal pain and change in bowel habits Musculoskeletal:negative for myalgias Neurological: negative for gait problems and tremors Behavioral/Psych: negative for abusive relationship, depression Endocrine: negative for temperature intolerance   Genitourinary:negative for genital lesions; positive for hot flashes, sIntegument/breast: negative for breast lump, breast tenderness, nipple discharge and skin lesion(s)    Objective:       Temp(Src) 98 F (36.7 C)  Ht 5' 6.5" (1.689 m)  Wt 84.823 kg (187 lb)  BMI 29.73 kg/m2 General:   alert  Skin:  no rash or abnormalities  Lungs:   clear to auscultation bilaterally  Heart:   regular rate and rhythm, S1, S2 normal, no murmur, click, rub or gallop  Breasts:   normal without suspicious masses, skin or nipple changes or axillary nodes  Abdomen:  normal findings: no organomegaly, soft, non-tender and no hernia  Pelvis:  External genitalia: normal general appearance Urinary system: urethral meatus normal and bladder without fullness, nontender Vaginal: normal without tenderness, induration or masses Cervix: normal appearance Adnexa: normal  bimanual exam   Lab Review  Labs reviewed no Radiologic studies reviewed no     Assessment:    Healthy female exam.   Menopausal symptoms ?Anxiety/panic attacks Plan:    Education reviewed: calcium supplements, low fat, low cholesterol diet and weight bearing exercise.   Need to obtain previous records Possible management options include: life-style modifications, deep breathing, meditation, psychologist, ERT Follow up as needed.

## 2014-07-22 LAB — HIV-1 RNA QUANT-NO REFLEX-BLD: HIV-1 RNA Quant, Log: <1.3 {Log} (ref <1.30)

## 2014-07-22 LAB — T-HELPER CELL (CD4) - (RCID CLINIC ONLY)
CD4 T CELL HELPER: 51 % (ref 33–55)
CD4 T Cell Abs: 870 /uL (ref 400–2700)

## 2014-07-22 LAB — PAP IG (IMAGE GUIDED)

## 2014-07-22 LAB — RPR

## 2014-08-11 ENCOUNTER — Ambulatory Visit: Payer: BC Managed Care – PPO | Admitting: Family

## 2014-08-21 ENCOUNTER — Ambulatory Visit: Payer: BC Managed Care – PPO | Admitting: Infectious Diseases

## 2014-09-01 ENCOUNTER — Encounter: Payer: Self-pay | Admitting: *Deleted

## 2014-09-02 ENCOUNTER — Encounter: Payer: Self-pay | Admitting: Obstetrics & Gynecology

## 2014-09-02 ENCOUNTER — Encounter: Payer: Self-pay | Admitting: Family

## 2014-09-02 ENCOUNTER — Ambulatory Visit (INDEPENDENT_AMBULATORY_CARE_PROVIDER_SITE_OTHER): Payer: BC Managed Care – PPO | Admitting: Family

## 2014-09-02 VITALS — BP 112/74 | HR 114 | Temp 98.2°F | Wt 181.0 lb

## 2014-09-02 DIAGNOSIS — F410 Panic disorder [episodic paroxysmal anxiety] without agoraphobia: Secondary | ICD-10-CM

## 2014-09-02 DIAGNOSIS — G43009 Migraine without aura, not intractable, without status migrainosus: Secondary | ICD-10-CM

## 2014-09-02 DIAGNOSIS — B20 Human immunodeficiency virus [HIV] disease: Secondary | ICD-10-CM

## 2014-09-02 DIAGNOSIS — I10 Essential (primary) hypertension: Secondary | ICD-10-CM

## 2014-09-02 LAB — BASIC METABOLIC PANEL
BUN: 10 mg/dL (ref 6–23)
CALCIUM: 9.3 mg/dL (ref 8.4–10.5)
CO2: 32 mEq/L (ref 19–32)
CREATININE: 1.1 mg/dL (ref 0.4–1.2)
Chloride: 107 mEq/L (ref 96–112)
GFR: 69.57 mL/min (ref >60.00)
Glucose, Bld: 87 mg/dL (ref 70–99)
Potassium: 5.1 mEq/L (ref 3.5–5.1)
Sodium: 142 mEq/L (ref 135–145)

## 2014-09-02 MED ORDER — CLONAZEPAM 0.5 MG PO TABS: 0.5000 mg | ORAL_TABLET | Freq: Every day | ORAL | Status: DC | PRN

## 2014-09-02 MED ORDER — HYDROCHLOROTHIAZIDE 12.5 MG PO TABS: 25.0000 mg | ORAL_TABLET | Freq: Every day | ORAL | Status: DC

## 2014-09-02 MED ORDER — HYDROCHLOROTHIAZIDE 25 MG PO TABS: 25.0000 mg | ORAL_TABLET | Freq: Every day | ORAL | Status: DC

## 2014-09-02 NOTE — Progress Notes (Signed)
Pre visit review using our clinic review tool, if applicable. No additional management support is needed unless otherwise documented below in the visit note. 

## 2014-09-02 NOTE — Progress Notes (Signed)
   Subjective:    Patient ID: [name], female    DOB: 10-31-1965, 48 y.o.   MRN: [id number]  HPI  48 year old African-American female, nonsmoker with a history of hypertension, migraine headaches, anxiety, and HIV disease is in today for recheck. Stopped Wellbutrin due to increased anxiety taken the medication. Reports feeling anxious and panicky approximately 2-3 times per month. Characterized by increased heart rate and palpitations. In the past, she has taken Xanax that helped. However became concerned about the potential for abuse and dependence she stopped that medication. She is under the care of infectious disease for HIV disease and is currently stable on medication regimen. Last CD4 count was 870 and viral load was undetectable.  Review of Systems  Constitutional: Negative.   Respiratory: Negative.   Cardiovascular: Negative.   Gastrointestinal: Negative.   Endocrine: Negative.   Genitourinary: Negative.   Musculoskeletal: Negative.   Skin: Negative.   Allergic/Immunologic: Negative.   Neurological: Negative.   Psychiatric/Behavioral:       Panic attacks a few times a month.   All other systems reviewed and are negative.      Objective:   Physical Exam  Constitutional: She is oriented to person, place, and time. She appears well-developed and well-nourished.  HENT:  Right Ear: External ear normal.  Left Ear: External ear normal.  Nose: Nose normal.  Mouth/Throat: Oropharynx is clear and moist.  Neck: Normal range of motion. Neck supple.  Cardiovascular: Normal rate, regular rhythm and normal heart sounds.   Pulmonary/Chest: Effort normal and breath sounds normal.  Abdominal: Soft. Bowel sounds are normal.  Musculoskeletal: Normal range of motion.  Neurological: She is alert and oriented to person, place, and time.  Skin: Skin is warm and dry.  Psychiatric: She has a normal mood and affect.          Assessment & Plan:  [name] was seen today for  follow-up.  Diagnoses and associated orders for this visit:  Essential hypertension - Basic Metabolic Panel  Nonintractable migraine, unspecified migraine type  Panic attack  HIV disease  Other Orders - Discontinue: hydrochlorothiazide (HYDRODIURIL) 12.5 MG tablet; Take 2 tablets (25 mg total) by mouth daily. - hydrochlorothiazide (HYDRODIURIL) 25 MG tablet; Take 1 tablet (25 mg total) by mouth daily. - clonazePAM (KLONOPIN) 0.5 MG tablet; Take 1 tablet (0.5 mg total) by mouth daily as needed for anxiety.    We'll trial Klonopin as needed for panic attacks. Recheck in weeks. Recheck labs

## 2014-09-22 ENCOUNTER — Ambulatory Visit: Payer: BC Managed Care – PPO | Admitting: Infectious Diseases

## 2014-09-23 ENCOUNTER — Ambulatory Visit: Payer: BC Managed Care – PPO | Admitting: Infectious Diseases

## 2014-10-02 ENCOUNTER — Ambulatory Visit: Payer: Self-pay | Admitting: Family

## 2014-10-02 ENCOUNTER — Encounter: Payer: Self-pay | Admitting: Family

## 2014-10-02 ENCOUNTER — Ambulatory Visit (INDEPENDENT_AMBULATORY_CARE_PROVIDER_SITE_OTHER): Payer: BLUE CROSS/BLUE SHIELD | Admitting: Family

## 2014-10-02 ENCOUNTER — Ambulatory Visit: Payer: BC Managed Care – PPO | Admitting: Infectious Diseases

## 2014-10-02 ENCOUNTER — Ambulatory Visit: Payer: BC Managed Care – PPO | Admitting: Family

## 2014-10-02 VITALS — BP 118/78 | Temp 98.1°F | Wt 183.0 lb

## 2014-10-02 DIAGNOSIS — F411 Generalized anxiety disorder: Secondary | ICD-10-CM

## 2014-10-02 DIAGNOSIS — I10 Essential (primary) hypertension: Secondary | ICD-10-CM

## 2014-10-02 DIAGNOSIS — F329 Major depressive disorder, single episode, unspecified: Secondary | ICD-10-CM

## 2014-10-02 DIAGNOSIS — F32A Depression, unspecified: Secondary | ICD-10-CM

## 2014-10-02 MED ORDER — ESCITALOPRAM OXALATE 10 MG PO TABS: 10.0000 mg | ORAL_TABLET | Freq: Every day | ORAL | Status: DC

## 2014-10-02 NOTE — Progress Notes (Signed)
Subjective:    Patient ID: [name], female    DOB: 10/31/1965, 49 y.o.   MRN: [id number]  HPI  49 year old AAF, nonsmoker with a history of HTN, HIV, and Anxiety is in today for a recheck. Reports Klonopin helping her anxiety symptoms but would still like to consider a SSRI. She has taken Wellbutrin before that caused headaches. Has had approx 3 panic attacks in the last week. Reports increased stress with a job change. Denies any helplessness, hopelessness, thoughts of death or dying.   Review of Systems  Constitutional: Negative.   HENT: Negative.   Respiratory: Negative.   Cardiovascular: Negative.   Gastrointestinal: Negative.   Endocrine: Negative.   Genitourinary: Negative.   Musculoskeletal: Negative.   Allergic/Immunologic: Negative.   Neurological: Negative.   Hematological: Negative.   Psychiatric/Behavioral: The patient is nervous/anxious.    Past Medical History  Diagnosis Date  . Migraine   . HIV (human immunodeficiency virus infection)     History   Social History  . Marital Status: Single    Spouse Name: N/A    Number of Children: 1  . Years of Education: N/A   Occupational History  . Administrative assistant    Social History Main Topics  . Smoking status: Never Smoker   . Smokeless tobacco: Never Used  . Alcohol Use: 0.0 oz/week    0 Not specified per week     Comment: socially  . Drug Use: No  . Sexual Activity:    Partners: Male    Birth Control/ Protection: Abstinence, Surgical   Other Topics Concern  . Not on file   Social History Narrative    Past Surgical History  Procedure Laterality Date  . Abdominal hysterectomy Right 2004    partial    Family History  Problem Relation Age of Onset  . Diabetes    . Hypertension    . Brain cancer    . Cerebral aneurysm Father [id number]  . Aortic aneurysm Father     Allergies  Allergen Reactions  . Sulfonamide Derivatives Hives    Current Outpatient Prescriptions on File Prior to  Visit  Medication Sig Dispense Refill  . clonazePAM (KLONOPIN) 0.5 MG tablet Take 1 tablet (0.5 mg total) by mouth daily as needed for anxiety. 30 tablet 1  . emtricitabine-tenofovir (TRUVADA) 200-300 MG per tablet Take 1 tablet by mouth daily. 90 tablet 1  . hydrochlorothiazide (HYDRODIURIL) 25 MG tablet Take 1 tablet (25 mg total) by mouth daily. 90 tablet 1  . SUMAtriptan (IMITREX) 100 MG tablet Take 1 tablet (100 mg total) by mouth once as needed. 10 tablet 3   No current facility-administered medications on file prior to visit.    BP 118/78 mmHg  Temp(Src) 98.1 F (36.7 C) (Oral)  Wt 183 lb (83.008 kg)chart    Objective:   Physical Exam  Constitutional: She is oriented to person, place, and time. She appears well-developed and well-nourished.  HENT:  Right Ear: External ear normal.  Left Ear: External ear normal.  Nose: Nose normal.  Mouth/Throat: Oropharynx is clear and moist.  Neck: Normal range of motion. Neck supple.  Cardiovascular: Normal rate, regular rhythm and normal heart sounds.   Pulmonary/Chest: Effort normal and breath sounds normal.  Abdominal: Soft. Bowel sounds are normal.  Musculoskeletal: Normal range of motion.  Neurological: She is alert and oriented to person, place, and time.  Skin: Skin is warm and dry.  Psychiatric: She has a normal mood and affect.  Assessment & Plan:  [name] was seen today for follow-up.  Diagnoses and associated orders for this visit:  Generalized anxiety disorder  Depression  Essential hypertension  Other Orders - escitalopram (LEXAPRO) 10 MG tablet; Take 1 tablet (10 mg total) by mouth daily.    Encouraged exercise. Start Lexapro. Klonopin only as needed. Recheck in 4 weeks.

## 2014-10-02 NOTE — Patient Instructions (Signed)
Generalized Anxiety Disorder Generalized anxiety disorder (GAD) is a mental disorder. It interferes with life functions, including relationships, work, and school. GAD is different from normal anxiety, which everyone experiences at some point in their lives in response to specific life events and activities. Normal anxiety actually helps us prepare for and get through these life events and activities. Normal anxiety goes away after the event or activity is over.  GAD causes anxiety that is not necessarily related to specific events or activities. It also causes excess anxiety in proportion to specific events or activities. The anxiety associated with GAD is also difficult to control. GAD can vary from mild to severe. People with severe GAD can have intense waves of anxiety with physical symptoms (panic attacks).  SYMPTOMS The anxiety and worry associated with GAD are difficult to control. This anxiety and worry are related to many life events and activities and also occur more days than not for 6 months or longer. People with GAD also have three or more of the following symptoms (one or more in children):  Restlessness.   Fatigue.  Difficulty concentrating.   Irritability.  Muscle tension.  Difficulty sleeping or unsatisfying sleep. DIAGNOSIS GAD is diagnosed through an assessment by your health care provider. Your health care provider will ask you questions aboutyour mood,physical symptoms, and events in your life. Your health care provider may ask you about your medical history and use of alcohol or drugs, including prescription medicines. Your health care provider may also do a physical exam and blood tests. Certain medical conditions and the use of certain substances can cause symptoms similar to those associated with GAD. Your health care provider may refer you to a mental health specialist for further evaluation. TREATMENT The following therapies are usually used to treat GAD:    Medication. Antidepressant medication usually is prescribed for long-term daily control. Antianxiety medicines may be added in severe cases, especially when panic attacks occur.   Talk therapy (psychotherapy). Certain types of talk therapy can be helpful in treating GAD by providing support, education, and guidance. A form of talk therapy called cognitive behavioral therapy can teach you healthy ways to think about and react to daily life events and activities.  Stress managementtechniques. These include yoga, meditation, and exercise and can be very helpful when they are practiced regularly. A mental health specialist can help determine which treatment is best for you. Some people see improvement with one therapy. However, other people require a combination of therapies. Document Released: 12/17/2012 Document Revised: 01/06/2014 Document Reviewed: 12/17/2012 [employer] Patient Information 2015 [employer]. This information is not intended to replace advice given to you by your health care provider. Make sure you discuss any questions you have with your health care provider.  

## 2014-10-02 NOTE — Progress Notes (Signed)
Pre visit review using our clinic review tool, if applicable. No additional management support is needed unless otherwise documented below in the visit note. 

## 2014-10-03 ENCOUNTER — Ambulatory Visit: Payer: Self-pay | Admitting: Family

## 2014-10-08 ENCOUNTER — Other Ambulatory Visit: Payer: Self-pay | Admitting: *Deleted

## 2014-10-08 DIAGNOSIS — B20 Human immunodeficiency virus [HIV] disease: Secondary | ICD-10-CM

## 2014-10-08 MED ORDER — DOLUTEGRAVIR SODIUM 50 MG PO TABS: 50.0000 mg | ORAL_TABLET | Freq: Every day | ORAL | Status: DC

## 2014-10-08 MED ORDER — EMTRICITABINE-TENOFOVIR DF 200-300 MG PO TABS
1.0000 | ORAL_TABLET | Freq: Every day | ORAL | Status: DC
Start: 2014-10-08 — End: 2015-04-20

## 2014-10-27 ENCOUNTER — Ambulatory Visit (INDEPENDENT_AMBULATORY_CARE_PROVIDER_SITE_OTHER): Payer: BLUE CROSS/BLUE SHIELD | Admitting: Infectious Diseases

## 2014-10-27 ENCOUNTER — Encounter: Payer: Self-pay | Admitting: Infectious Diseases

## 2014-10-27 VITALS — BP 122/83 | HR 80 | Temp 97.4°F | Ht 67.0 in | Wt 182.0 lb

## 2014-10-27 DIAGNOSIS — Z113 Encounter for screening for infections with a predominantly sexual mode of transmission: Secondary | ICD-10-CM

## 2014-10-27 DIAGNOSIS — G4700 Insomnia, unspecified: Secondary | ICD-10-CM | POA: Diagnosis not present

## 2014-10-27 DIAGNOSIS — Z79899 Other long term (current) drug therapy: Secondary | ICD-10-CM

## 2014-10-27 DIAGNOSIS — B20 Human immunodeficiency virus [HIV] disease: Secondary | ICD-10-CM

## 2014-10-27 DIAGNOSIS — F411 Generalized anxiety disorder: Secondary | ICD-10-CM

## 2014-10-27 LAB — CBC
HCT: 41 % (ref 36.0–46.0)
HEMOGLOBIN: 13.2 g/dL (ref 12.0–15.0)
MCH: 27.8 pg (ref 26.0–34.0)
MCHC: 32.2 g/dL (ref 30.0–36.0)
MCV: 86.3 fL (ref 78.0–100.0)
MPV: 9.1 fL (ref 8.6–12.4)
PLATELETS: 298 10*3/uL (ref 150–400)
RBC: 4.75 MIL/uL (ref 3.87–5.11)
RDW: 14.9 % (ref 11.5–15.5)
WBC: 4.8 10*3/uL (ref 4.0–10.5)

## 2014-10-27 LAB — LIPID PANEL
CHOLESTEROL: 165 mg/dL (ref 0–200)
HDL: 43 mg/dL — ABNORMAL LOW (ref >=46)
LDL Cholesterol: 104 mg/dL — ABNORMAL HIGH (ref 0–99)
Total CHOL/HDL Ratio: 3.8 Ratio
Triglycerides: 90 mg/dL (ref <150)
VLDL: 18 mg/dL (ref 0–40)

## 2014-10-27 LAB — COMPREHENSIVE METABOLIC PANEL
ALBUMIN: 4.1 g/dL (ref 3.5–5.2)
ALT: 10 U/L (ref 0–35)
AST: 15 U/L (ref 0–37)
Alkaline Phosphatase: 85 U/L (ref 39–117)
BILIRUBIN TOTAL: 0.3 mg/dL (ref 0.2–1.2)
BUN: 14 mg/dL (ref 6–23)
CALCIUM: 9.5 mg/dL (ref 8.4–10.5)
CO2: 29 mEq/L (ref 19–32)
CREATININE: 1.11 mg/dL — AB (ref 0.50–1.10)
Chloride: 105 mEq/L (ref 96–112)
GLUCOSE: 69 mg/dL — AB (ref 70–99)
Potassium: 3.7 mEq/L (ref 3.5–5.3)
Sodium: 142 mEq/L (ref 135–145)
TOTAL PROTEIN: 7 g/dL (ref 6.0–8.3)

## 2014-10-27 NOTE — Assessment & Plan Note (Signed)
Appreciate PCP f/u. We discussed "sleep hygiene"

## 2014-10-27 NOTE — Assessment & Plan Note (Signed)
Greatly appreciate PCP f/u.  

## 2014-10-27 NOTE — Assessment & Plan Note (Signed)
She sis doing very well.  Will set her up for Mammo in April.  Offered/refused condoms.  Hep B immune Will see her back in 6 months

## 2014-10-27 NOTE — Progress Notes (Signed)
   Subjective:    Patient ID: [name], female    DOB: Sep 09, 1965, 49 y.o.   MRN: [id number]  HPI 49 yo F with hx of HIV+, was previously on TRV/NFV then switched to complera for simplicity. 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 In February 2013 was changed to DTGV/TRV. Has been seeing PCP and trying different rx for anxiety- has tried wellbutrin which did not help. She was also given klonipin (which helps pnr) and lastly tried on lexapro which gave her insomnia.   HIV 1 RNA QUANT (copies/mL)  Date Value  07/21/2014 <20  12/16/2013 <20  01/30/2013 <20   CD4 T CELL ABS  Date Value  07/21/2014 870 /uL  12/16/2013 1080 /uL  01/30/2013 840 cmm    Review of Systems  Constitutional: Negative for appetite change and unexpected weight change.  Gastrointestinal: Positive for constipation. Negative for diarrhea.  Genitourinary: Negative for difficulty urinating.  Psychiatric/Behavioral: Positive for sleep disturbance. The patient is nervous/anxious.    NL PAP 07-2014.  NL Mammo 12-2013     Objective:   Physical Exam  Constitutional: She appears well-developed and well-nourished.  HENT:  Mouth/Throat: No oropharyngeal exudate.  Eyes: EOM are normal. Pupils are equal, round, and reactive to light.  Neck: Neck supple.  Cardiovascular: Normal rate, regular rhythm and normal heart sounds.   Pulmonary/Chest: Effort normal and breath sounds normal.  Abdominal: Soft. Bowel sounds are normal. She exhibits no distension. There is no tenderness.  Lymphadenopathy:    She has no cervical adenopathy.          Assessment & Plan:

## 2014-10-28 LAB — HIV-1 RNA QUANT-NO REFLEX-BLD
HIV 1 RNA Quant: <20 copies/mL (ref <20)
HIV-1 RNA Quant, Log: <1.3 {Log} (ref <1.30)

## 2014-10-28 LAB — T-HELPER CELL (CD4) - (RCID CLINIC ONLY)
CD4 T CELL ABS: 970 /uL (ref 400–2700)
CD4 T CELL HELPER: 44 % (ref 33–55)

## 2014-10-28 LAB — RPR

## 2015-01-05 ENCOUNTER — Ambulatory Visit: Payer: BLUE CROSS/BLUE SHIELD

## 2015-01-05 ENCOUNTER — Ambulatory Visit: Payer: BC Managed Care – PPO

## 2015-01-05 ENCOUNTER — Ambulatory Visit
Admission: RE | Admit: 2015-01-05 | Discharge: 2015-01-05 | Disposition: A | Discharge status: Home / Self Care | Payer: BLUE CROSS/BLUE SHIELD | Source: Ambulatory Visit

## 2015-01-05 DIAGNOSIS — Z1239 Encounter for other screening for malignant neoplasm of breast: Secondary | ICD-10-CM

## 2015-03-30 ENCOUNTER — Other Ambulatory Visit: Payer: BLUE CROSS/BLUE SHIELD

## 2015-03-30 DIAGNOSIS — B20 Human immunodeficiency virus [HIV] disease: Secondary | ICD-10-CM

## 2015-03-31 LAB — T-HELPER CELL (CD4) - (RCID CLINIC ONLY)
CD4 % Helper T Cell: 52 % (ref 33–55)
CD4 T Cell Abs: 950 /uL (ref 400–2700)

## 2015-04-01 LAB — HIV-1 RNA QUANT-NO REFLEX-BLD: HIV-1 RNA Quant, Log: <1.3 {Log} (ref <1.30)

## 2015-04-02 ENCOUNTER — Other Ambulatory Visit: Payer: Self-pay | Admitting: Family

## 2015-04-13 ENCOUNTER — Other Ambulatory Visit: Payer: BLUE CROSS/BLUE SHIELD

## 2015-04-20 ENCOUNTER — Other Ambulatory Visit: Payer: Self-pay | Admitting: *Deleted

## 2015-04-20 DIAGNOSIS — B20 Human immunodeficiency virus [HIV] disease: Secondary | ICD-10-CM

## 2015-04-20 MED ORDER — EMTRICITABINE-TENOFOVIR DF 200-300 MG PO TABS: 1.0000 | ORAL_TABLET | Freq: Every day | ORAL | Status: DC

## 2015-04-20 MED ORDER — DOLUTEGRAVIR SODIUM 50 MG PO TABS: 50.0000 mg | ORAL_TABLET | Freq: Every day | ORAL | Status: DC

## 2015-04-27 ENCOUNTER — Ambulatory Visit: Payer: BLUE CROSS/BLUE SHIELD | Admitting: Infectious Diseases

## 2015-05-04 ENCOUNTER — Ambulatory Visit (INDEPENDENT_AMBULATORY_CARE_PROVIDER_SITE_OTHER): Payer: BLUE CROSS/BLUE SHIELD | Admitting: Infectious Diseases

## 2015-05-04 VITALS — BP 133/94 | HR 69 | Temp 98.1°F | Ht 67.5 in | Wt 185.0 lb

## 2015-05-04 DIAGNOSIS — O1002 Pre-existing essential hypertension complicating childbirth: Secondary | ICD-10-CM

## 2015-05-04 DIAGNOSIS — B20 Human immunodeficiency virus [HIV] disease: Secondary | ICD-10-CM

## 2015-05-04 DIAGNOSIS — G4700 Insomnia, unspecified: Secondary | ICD-10-CM | POA: Diagnosis not present

## 2015-05-04 DIAGNOSIS — F411 Generalized anxiety disorder: Secondary | ICD-10-CM | POA: Diagnosis not present

## 2015-05-04 DIAGNOSIS — Z79899 Other long term (current) drug therapy: Secondary | ICD-10-CM

## 2015-05-04 DIAGNOSIS — Z23 Encounter for immunization: Secondary | ICD-10-CM | POA: Diagnosis not present

## 2015-05-04 DIAGNOSIS — Z113 Encounter for screening for infections with a predominantly sexual mode of transmission: Secondary | ICD-10-CM

## 2015-05-04 MED ORDER — EMTRICITABINE-TENOFOVIR AF 200-25 MG PO TABS: 1.0000 | ORAL_TABLET | Freq: Every day | ORAL | Status: DC

## 2015-05-04 NOTE — Assessment & Plan Note (Signed)
Encouraged her to restart her antihtn rx.

## 2015-05-04 NOTE — Progress Notes (Signed)
   Subjective:    Patient ID: [name], female    DOB: 07-Mar-1966, 49 y.o.   MRN: [id number]  HPI 49 yo F with hx of HIV+, was previously on TRV/NFV then switched to complera for simplicity. 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 In February 2013 was changed to DTGV/TRV. Has had no problems with ART.  Has been seeing PCP and trying different rx for anxiety- has tried wellbutrin which did not help. She was also given klonipin (which helps prn- has more than 1/2 of a 30 tab rx from beginning of year) and lastly tried on lexapro which gave her insomnia.  Has been taking benadryl at night- helps her sleep and helps her allergy.  Has not had PAP this year (Nov- 2015) Has not taken HCTZ in last 24h, had coffee this AM.   HIV 1 RNA QUANT (copies/mL)  Date Value  03/30/2015 <20  10/27/2014 <20  07/21/2014 <20   CD4 T CELL ABS (/uL)  Date Value  03/30/2015 950  10/27/2014 970  07/21/2014 870    Has been going to gym ~3/wk.   Review of Systems  Constitutional: Negative for appetite change and unexpected weight change.  Respiratory: Negative for shortness of breath.   Cardiovascular: Negative for chest pain.  Gastrointestinal: Negative for diarrhea and constipation.  Genitourinary: Negative for difficulty urinating.  Neurological: Negative for headaches.       Objective:   Physical Exam  Constitutional: She appears well-developed and well-nourished.  HENT:  Mouth/Throat: No oropharyngeal exudate.  Eyes: EOM are normal. Pupils are equal, round, and reactive to light.  Neck: Neck supple.  Cardiovascular: Normal rate, regular rhythm and normal heart sounds.   Pulmonary/Chest: Effort normal and breath sounds normal.  Abdominal: Soft. Bowel sounds are normal. There is no tenderness. There is no rebound.  Lymphadenopathy:    She has no cervical adenopathy.       Assessment & Plan:

## 2015-05-04 NOTE — Assessment & Plan Note (Signed)
Appears to be doing well  Cautioned her about use of benadryl and constipation

## 2015-05-04 NOTE — Assessment & Plan Note (Signed)
She's doing well.  Will change her to descovy/dtgv for renal sparing and bone sparing.  Will have her back 3 months after this.  She will f/u with PCP for PAP.

## 2015-05-04 NOTE — Assessment & Plan Note (Signed)
Doing well on prn rx only

## 2015-05-28 ENCOUNTER — Other Ambulatory Visit: Payer: Self-pay | Admitting: *Deleted

## 2015-05-28 ENCOUNTER — Ambulatory Visit: Payer: BLUE CROSS/BLUE SHIELD | Admitting: Family

## 2015-05-28 ENCOUNTER — Ambulatory Visit (INDEPENDENT_AMBULATORY_CARE_PROVIDER_SITE_OTHER): Payer: BLUE CROSS/BLUE SHIELD | Admitting: Family

## 2015-05-28 ENCOUNTER — Encounter: Payer: Self-pay | Admitting: Family

## 2015-05-28 VITALS — BP 124/70 | HR 72 | Temp 98.5°F | Ht 67.2 in | Wt 185.0 lb

## 2015-05-28 DIAGNOSIS — I10 Essential (primary) hypertension: Secondary | ICD-10-CM

## 2015-05-28 DIAGNOSIS — B20 Human immunodeficiency virus [HIV] disease: Secondary | ICD-10-CM | POA: Diagnosis not present

## 2015-05-28 MED ORDER — HYDROCHLOROTHIAZIDE 25 MG PO TABS: 25.0000 mg | ORAL_TABLET | Freq: Every day | ORAL | Status: DC

## 2015-05-28 NOTE — Progress Notes (Signed)
Subjective:    Patient ID: [name], female    DOB: 08/15/1966, 49 y.o.   MRN: [id number]  HPI  49 year old Afro-American female, nonsmoker with a history of hypertension, Generalized Anxiety Disorder, and HIV. REports doing well. No concerns. Sees ID on a regular basis and is stable with HIV Disease.    Review of Systems  Constitutional: Negative.   HENT: Negative.   Respiratory: Negative.   Cardiovascular: Negative.   Gastrointestinal: Negative.   Endocrine: Negative.   Genitourinary: Negative.   Musculoskeletal: Negative.   Skin: Negative.   Allergic/Immunologic: Negative.   Neurological: Negative.   Hematological: Negative.   Psychiatric/Behavioral: Negative.    Past Medical History  Diagnosis Date  . Migraine   . HIV (human immunodeficiency virus infection)     Social History   Social History  . Marital Status: Single    Spouse Name: N/A  . Number of Children: 1  . Years of Education: N/A   Occupational History  . Administrative assistant    Social History Main Topics  . Smoking status: Never Smoker   . Smokeless tobacco: Never Used  . Alcohol Use: 0.0 oz/week    0 Standard drinks or equivalent per week     Comment: socially  . Drug Use: No  . Sexual Activity:    Partners: Male    Birth Control/ Protection: Abstinence, Surgical   Other Topics Concern  . Not on file   Social History Narrative    Past Surgical History  Procedure Laterality Date  . Abdominal hysterectomy Right 2004    partial    Family History  Problem Relation Age of Onset  . Diabetes    . Hypertension    . Brain cancer    . Cerebral aneurysm Father 31  . Aortic aneurysm Father     Allergies  Allergen Reactions  . Sulfonamide Derivatives Hives    Current Outpatient Prescriptions on File Prior to Visit  Medication Sig Dispense Refill  . clonazePAM (KLONOPIN) 0.5 MG tablet Take 1 tablet (0.5 mg total) by mouth daily as needed for anxiety. 30 tablet 1  .  dolutegravir (TIVICAY) 50 MG tablet Take 1 tablet (50 mg total) by mouth daily. 90 tablet 1  . emtricitabine-tenofovir AF (DESCOVY) 200-25 MG per tablet Take 1 tablet by mouth daily. 90 tablet 3  . SUMAtriptan (IMITREX) 100 MG tablet Take 1 tablet (100 mg total) by mouth once as needed. (Patient not taking: Reported on 10/27/2014) 10 tablet 3   No current facility-administered medications on file prior to visit.    BP 124/70 mmHg  Pulse 72  Temp(Src) 98.5 F (36.9 C) (Oral)  Ht 5' 7.2" (1.707 m)  Wt 185 lb (83.915 kg)  BMI 28.80 kg/m2  SpO2 96%chart     Objective:   Physical Exam  Constitutional: She is oriented to person, place, and time. She appears well-developed and well-nourished.  HENT:  Right Ear: External ear normal.  Left Ear: External ear normal.  Nose: Nose normal.  Mouth/Throat: Oropharynx is clear and moist.  Neck: Normal range of motion. Neck supple.  Cardiovascular: Normal rate, regular rhythm and normal heart sounds.   Pulmonary/Chest: Effort normal and breath sounds normal.  Abdominal: Soft. Bowel sounds are normal.  Musculoskeletal: Normal range of motion.  Neurological: She is alert and oriented to person, place, and time.  Skin: Skin is warm and dry.  Psychiatric: She has a normal mood and affect.  Assessment & Plan:  [name] was seen today for follow-up.  Diagnoses and all orders for this visit:  Essential hypertension  Human immunodeficiency virus (HIV) disease  Other orders -     hydrochlorothiazide (HYDRODIURIL) 25 MG tablet; Take 1 tablet (25 mg total) by mouth daily.   Patient is due to have labs drawn at the end of the month through infectious disease. We'll defer labs until then. Hydrochlorothiazide refill today. Call the office with any questions or concerns recheck in 6 months and sooner as scheduled

## 2015-05-28 NOTE — Progress Notes (Signed)
Pre visit review using our clinic review tool, if applicable. No additional management support is needed unless otherwise documented below in the visit note. 

## 2015-05-28 NOTE — Patient Instructions (Signed)

## 2015-08-04 ENCOUNTER — Other Ambulatory Visit: Payer: Self-pay | Admitting: Family

## 2015-11-24 ENCOUNTER — Other Ambulatory Visit: Payer: Self-pay | Admitting: Family

## 2015-11-30 ENCOUNTER — Other Ambulatory Visit: Payer: Self-pay

## 2015-11-30 DIAGNOSIS — Z1231 Encounter for screening mammogram for malignant neoplasm of breast: Secondary | ICD-10-CM

## 2016-01-15 ENCOUNTER — Ambulatory Visit
Admission: RE | Admit: 2016-01-15 | Discharge: 2016-01-15 | Disposition: A | Discharge status: Home / Self Care | Payer: BLUE CROSS/BLUE SHIELD | Source: Ambulatory Visit

## 2016-01-15 DIAGNOSIS — Z1231 Encounter for screening mammogram for malignant neoplasm of breast: Secondary | ICD-10-CM | POA: Diagnosis not present

## 2016-02-12 ENCOUNTER — Encounter: Payer: Self-pay | Admitting: *Deleted

## 2016-02-12 ENCOUNTER — Telehealth: Payer: Self-pay | Admitting: *Deleted

## 2016-02-12 NOTE — Telephone Encounter (Signed)
This encounter was created in error - please disregard.

## 2016-02-12 NOTE — Telephone Encounter (Signed)
Patient requesting Clonazepam 0.5 MG Tablets, takes once daily prn anxiety. [facility] filled on 11-24-14 [facility] [address]

## 2016-02-15 ENCOUNTER — Other Ambulatory Visit: Payer: Self-pay

## 2016-02-15 MED ORDER — CLONAZEPAM 0.5 MG PO TABS: 0.5000 mg | ORAL_TABLET | Freq: Every day | ORAL | Status: DC | PRN

## 2016-02-15 NOTE — Telephone Encounter (Signed)
Ok to refill 

## 2016-02-15 NOTE — Telephone Encounter (Signed)
Rx signed/faxed.

## 2016-02-15 NOTE — Telephone Encounter (Signed)
Pt has an appt with cory  In July 2017

## 2016-02-15 NOTE — Telephone Encounter (Signed)
Ok to refill for one month  

## 2016-03-10 ENCOUNTER — Ambulatory Visit: Payer: BLUE CROSS/BLUE SHIELD | Admitting: Adult Health

## 2016-03-29 ENCOUNTER — Ambulatory Visit: Payer: BLUE CROSS/BLUE SHIELD | Admitting: Adult Health

## 2016-04-06 ENCOUNTER — Encounter: Payer: Self-pay | Admitting: Adult Health

## 2016-04-06 ENCOUNTER — Ambulatory Visit (INDEPENDENT_AMBULATORY_CARE_PROVIDER_SITE_OTHER): Payer: BLUE CROSS/BLUE SHIELD | Admitting: Adult Health

## 2016-04-06 VITALS — BP 126/60 | Temp 98.6°F | Ht 67.2 in | Wt 191.6 lb

## 2016-04-06 DIAGNOSIS — Z7189 Other specified counseling: Secondary | ICD-10-CM

## 2016-04-06 DIAGNOSIS — B20 Human immunodeficiency virus [HIV] disease: Secondary | ICD-10-CM | POA: Diagnosis not present

## 2016-04-06 DIAGNOSIS — Z7689 Persons encountering health services in other specified circumstances: Secondary | ICD-10-CM

## 2016-04-06 DIAGNOSIS — I10 Essential (primary) hypertension: Secondary | ICD-10-CM

## 2016-04-06 NOTE — Patient Instructions (Addendum)
It was great meeting you today!  Please follow up with me this fall for your physical. If you need anything before that, please let me know.   Continue to work on diet and exercise.   Health Maintenance, Female Adopting a healthy lifestyle and getting preventive care can go a long way to promote health and wellness. Talk with your health care provider about what schedule of regular examinations is right for you. This is a good chance for you to check in with your provider about disease prevention and staying healthy. In between checkups, there are plenty of things you can do on your own. Experts have done a lot of research about which lifestyle changes and preventive measures are most likely to keep you healthy. Ask your health care provider for more information. WEIGHT AND DIET  Eat a healthy diet  Be sure to include plenty of vegetables, fruits, low-fat dairy products, and lean protein.  Do not eat a lot of foods high in solid fats, added sugars, or salt.  Get regular exercise. This is one of the most important things you can do for your health.  Most adults should exercise for at least 150 minutes each week. The exercise should increase your heart rate and make you sweat (moderate-intensity exercise).  Most adults should also do strengthening exercises at least twice a week. This is in addition to the moderate-intensity exercise.  Maintain a healthy weight  Body mass index (BMI) is a measurement that can be used to identify possible weight problems. It estimates body fat based on height and weight. Your health care provider can help determine your BMI and help you achieve or maintain a healthy weight.  For females 30 years of age and older:   A BMI below 18.5 is considered underweight.  A BMI of 18.5 to 24.9 is normal.  A BMI of 25 to 29.9 is considered overweight.  A BMI of 30 and above is considered obese.  Watch levels of cholesterol and blood lipids  You should start  having your blood tested for lipids and cholesterol at 50 years of age, then have this test every 5 years.  You may need to have your cholesterol levels checked more often if:  Your lipid or cholesterol levels are high.  You are older than 50 years of age.  You are at high risk for heart disease.  CANCER SCREENING   Lung Cancer  Lung cancer screening is recommended for adults 44-89 years old who are at high risk for lung cancer because of a history of smoking.  A yearly low-dose CT scan of the lungs is recommended for people who:  Currently smoke.  Have quit within the past 15 years.  Have at least a 30-pack-year history of smoking. A pack year is smoking an average of one pack of cigarettes a day for 1 year.  Yearly screening should continue until it has been 15 years since you quit.  Yearly screening should stop if you develop a health problem that would prevent you from having lung cancer treatment.  Breast Cancer  Practice breast self-awareness. This means understanding how your breasts normally appear and feel.  It also means doing regular breast self-exams. Let your health care provider know about any changes, no matter how small.  If you are in your 20s or 30s, you should have a clinical breast exam (CBE) by a health care provider every 1-3 years as part of a regular health exam.  If you are 40  or older, have a CBE every year. Also consider having a breast X-ray (mammogram) every year.  If you have a family history of breast cancer, talk to your health care provider about genetic screening.  If you are at high risk for breast cancer, talk to your health care provider about having an MRI and a mammogram every year.  Breast cancer gene (BRCA) assessment is recommended for women who have family members with BRCA-related cancers. BRCA-related cancers include:  Breast.  Ovarian.  Tubal.  Peritoneal cancers.  Results of the assessment will determine the need for  genetic counseling and BRCA1 and BRCA2 testing. Cervical Cancer Your health care provider may recommend that you be screened regularly for cancer of the pelvic organs (ovaries, uterus, and vagina). This screening involves a pelvic examination, including checking for microscopic changes to the surface of your cervix (Pap test). You may be encouraged to have this screening done every 3 years, beginning at age 62.  For women ages 60-65, health care providers may recommend pelvic exams and Pap testing every 3 years, or they may recommend the Pap and pelvic exam, combined with testing for human papilloma virus (HPV), every 5 years. Some types of HPV increase your risk of cervical cancer. Testing for HPV may also be done on women of any age with unclear Pap test results.  Other health care providers may not recommend any screening for nonpregnant women who are considered low risk for pelvic cancer and who do not have symptoms. Ask your health care provider if a screening pelvic exam is right for you.  If you have had past treatment for cervical cancer or a condition that could lead to cancer, you need Pap tests and screening for cancer for at least 20 years after your treatment. If Pap tests have been discontinued, your risk factors (such as having a new sexual partner) need to be reassessed to determine if screening should resume. Some women have medical problems that increase the chance of getting cervical cancer. In these cases, your health care provider may recommend more frequent screening and Pap tests. Colorectal Cancer  This type of cancer can be detected and often prevented.  Routine colorectal cancer screening usually begins at 50 years of age and continues through 50 years of age.  Your health care provider may recommend screening at an earlier age if you have risk factors for colon cancer.  Your health care provider may also recommend using home test kits to check for hidden blood in the  stool.  A small camera at the end of a tube can be used to examine your colon directly (sigmoidoscopy or colonoscopy). This is done to check for the earliest forms of colorectal cancer.  Routine screening usually begins at age 19.  Direct examination of the colon should be repeated every 5-10 years through 50 years of age. However, you may need to be screened more often if early forms of precancerous polyps or small growths are found. Skin Cancer  Check your skin from head to toe regularly.  Tell your health care provider about any new moles or changes in moles, especially if there is a change in a mole's shape or color.  Also tell your health care provider if you have a mole that is larger than the size of a pencil eraser.  Always use sunscreen. Apply sunscreen liberally and repeatedly throughout the day.  Protect yourself by wearing long sleeves, pants, a wide-brimmed hat, and sunglasses whenever you are outside. HEART DISEASE,  DIABETES, AND HIGH BLOOD PRESSURE   High blood pressure causes heart disease and increases the risk of stroke. High blood pressure is more likely to develop in:  People who have blood pressure in the high end of the normal range (130-139/85-89 mm Hg).  People who are overweight or obese.  People who are African American.  If you are 18-39 years of age, have your blood pressure checked every 3-5 years. If you are 40 years of age or older, have your blood pressure checked every year. You should have your blood pressure measured twice--once when you are at a hospital or clinic, and once when you are not at a hospital or clinic. Record the average of the two measurements. To check your blood pressure when you are not at a hospital or clinic, you can use:  An automated blood pressure machine at a pharmacy.  A home blood pressure monitor.  If you are between 55 years and 79 years old, ask your health care provider if you should take aspirin to prevent  strokes.  Have regular diabetes screenings. This involves taking a blood sample to check your fasting blood sugar level.  If you are at a normal weight and have a low risk for diabetes, have this test once every three years after 50 years of age.  If you are overweight and have a high risk for diabetes, consider being tested at a younger age or more often. PREVENTING INFECTION  Hepatitis B  If you have a higher risk for hepatitis B, you should be screened for this virus. You are considered at high risk for hepatitis B if:  You were born in a country where hepatitis B is common. Ask your health care provider which countries are considered high risk.  Your parents were born in a high-risk country, and you have not been immunized against hepatitis B (hepatitis B vaccine).  You have HIV or AIDS.  You use needles to inject street drugs.  You live with someone who has hepatitis B.  You have had sex with someone who has hepatitis B.  You get hemodialysis treatment.  You take certain medicines for conditions, including cancer, organ transplantation, and autoimmune conditions. Hepatitis C  Blood testing is recommended for:  Everyone born from 1945 through 1965.  Anyone with known risk factors for hepatitis C. Sexually transmitted infections (STIs)  You should be screened for sexually transmitted infections (STIs) including gonorrhea and chlamydia if:  You are sexually active and are younger than 50 years of age.  You are older than 50 years of age and your health care provider tells you that you are at risk for this type of infection.  Your sexual activity has changed since you were last screened and you are at an increased risk for chlamydia or gonorrhea. Ask your health care provider if you are at risk.  If you do not have HIV, but are at risk, it may be recommended that you take a prescription medicine daily to prevent HIV infection. This is called pre-exposure prophylaxis  (PrEP). You are considered at risk if:  You are sexually active and do not regularly use condoms or know the HIV status of your partner(s).  You take drugs by injection.  You are sexually active with a partner who has HIV. Talk with your health care provider about whether you are at high risk of being infected with HIV. If you choose to begin PrEP, you should first be tested for HIV. You should then   be tested every 3 months for as long as you are taking PrEP.  PREGNANCY   If you are premenopausal and you may become pregnant, ask your health care provider about preconception counseling.  If you may become pregnant, take 400 to 800 micrograms (mcg) of folic acid every day.  If you want to prevent pregnancy, talk to your health care provider about birth control (contraception). OSTEOPOROSIS AND MENOPAUSE   Osteoporosis is a disease in which the bones lose minerals and strength with aging. This can result in serious bone fractures. Your risk for osteoporosis can be identified using a bone density scan.  If you are 70 years of age or older, or if you are at risk for osteoporosis and fractures, ask your health care provider if you should be screened.  Ask your health care provider whether you should take a calcium or vitamin D supplement to lower your risk for osteoporosis.  Menopause may have certain physical symptoms and risks.  Hormone replacement therapy may reduce some of these symptoms and risks. Talk to your health care provider about whether hormone replacement therapy is right for you.  HOME CARE INSTRUCTIONS   Schedule regular health, dental, and eye exams.  Stay current with your immunizations.   Do not use any tobacco products including cigarettes, chewing tobacco, or electronic cigarettes.  If you are pregnant, do not drink alcohol.  If you are breastfeeding, limit how much and how often you drink alcohol.  Limit alcohol intake to no more than 1 drink per day for  nonpregnant women. One drink equals 12 ounces of beer, 5 ounces of wine, or 1 ounces of hard liquor.  Do not use street drugs.  Do not share needles.  Ask your health care provider for help if you need support or information about quitting drugs.  Tell your health care provider if you often feel depressed.  Tell your health care provider if you have ever been abused or do not feel safe at home.   This information is not intended to replace advice given to you by your health care provider. Make sure you discuss any questions you have with your health care provider.   Document Released: 03/07/2011 Document Revised: 09/12/2014 Document Reviewed: 07/24/2013 Elsevier Interactive Patient Education [employer].

## 2016-04-07 ENCOUNTER — Ambulatory Visit: Payer: BLUE CROSS/BLUE SHIELD | Admitting: Adult Health

## 2016-04-07 NOTE — Progress Notes (Signed)
Patient presents to clinic today to establish care. She is a pleasant 50 year old female who  has a past medical history of Anxiety; HIV (human immunodeficiency virus infection) (HCC); Hypertension; Insomnia; and Migraine.   Acute Concerns: Establish Care   Chronic Issues: HIV She is managed by ID. She feels as though this is well controlled on her current medication regimen. She reports that her CD4 counts have been in the 1000's  Migraines - She reports that she is getting migraines much less frequent then she used to. Currently she may have one once a week. She takes Excedrin for her migraines which she endorses works well.   Hypertension - Currently takes 25 mg HCTZ. Feels as though her blood pressure is well controlled on this.   Health Maintenance: Dental -- twice a year  Vision -- yearly  Immunizations --UTD Mammogram --01/2016 PAP -- Sees GYN Bone Density -- Not indicated  She is followed by  GYN- [facility]  Diet: She does not eat healthy on a consistent basis  Exercise: She does not exercise on a regular basis    Past Medical History:  Diagnosis Date  . Anxiety   . HIV (human immunodeficiency virus infection) (HCC)   . Hypertension   . Insomnia   . Migraine     Past Surgical History:  Procedure Laterality Date  . ABDOMINAL HYSTERECTOMY Right 2004   partial    Current Outpatient Prescriptions on File Prior to Visit  Medication Sig Dispense Refill  . clonazePAM (KLONOPIN) 0.5 MG tablet Take 1 tablet (0.5 mg total) by mouth daily as needed for anxiety. 30 tablet 1  . dolutegravir (TIVICAY) 50 MG tablet Take 1 tablet (50 mg total) by mouth daily. 90 tablet 1  . emtricitabine-tenofovir AF (DESCOVY) 200-25 MG per tablet Take 1 tablet by mouth daily. 90 tablet 3  . hydrochlorothiazide (HYDRODIURIL) 25 MG tablet Take 1 tablet (25 mg total) by mouth daily. 90 tablet 1  . SUMAtriptan (IMITREX) 100 MG tablet Take 1 tablet (100 mg total) by mouth once as needed.  10 tablet 3   No current facility-administered medications on file prior to visit.     Allergies  Allergen Reactions  . Sulfonamide Derivatives Hives    Family History  Problem Relation Age of Onset  . Cerebral aneurysm Father 72  . Aortic aneurysm Father   . Diabetes    . Hypertension    . Brain cancer      Social History   Social History  . Marital status: Single    Spouse name: N/A  . Number of children: 1  . Years of education: N/A   Occupational History  . 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 Smoking status: Never Smoker  . Smokeless tobacco: Never Used  . Alcohol use 0.0 oz/week     Comment: socially  . Drug use: No  . Sexual activity: Yes    Partners: Male    Birth control/ protection: Abstinence, Surgical   Other Topics Concern  . 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     Review of Systems  Constitutional: Negative.   Respiratory: Negative.   Cardiovascular: Negative.   Gastrointestinal: Negative.   Genitourinary: Negative.   Musculoskeletal: Negative.  Neurological: Negative.   Psychiatric/Behavioral: Negative for depression. The patient has insomnia.   All other systems reviewed and are negative.   BP 126/60   Temp 98.6 F (37 C) (Oral)   Ht 5' 7.2" (1.707 m)   Wt 191 lb 9.6 oz (86.9 kg)   BMI 29.83 kg/m   Physical Exam  Constitutional: She is oriented to person, place, and time and well-developed, well-nourished, and in no distress. No distress.  HENT:  Head: Normocephalic and atraumatic.  Right Ear: External ear normal.  Left Ear: External ear normal.  Nose: Nose normal.  Mouth/Throat: Oropharynx is clear and moist. No oropharyngeal exudate.  Eyes: Conjunctivae and EOM are normal. Pupils are equal, round, and reactive to light. Right eye exhibits  no discharge. Left eye exhibits no discharge. No scleral icterus.  Neck: Normal range of motion. Neck supple. No thyromegaly present.  Cardiovascular: Normal rate, regular rhythm, normal heart sounds and intact distal pulses.  Exam reveals no gallop and no friction rub.   No murmur heard. Pulmonary/Chest: Effort normal and breath sounds normal. No respiratory distress. She has no wheezes. She has no rales. She exhibits no tenderness.  Lymphadenopathy:    She has no cervical adenopathy.  Neurological: She is alert and oriented to person, place, and time. Gait normal. GCS score is 15.  Skin: Skin is warm and dry. No rash noted. She is not diaphoretic. No erythema. No pallor.  Psychiatric: Memory, affect and judgment normal.  Nursing note and vitals reviewed.   Assessment/Plan:  1. Encounter to establish care - Follow up for CPE - Follow up sooner if needed - Educated on the importance of a healthy diet and regular exercise 2. Essential hypertension - Controlled - No change   3. Human immunodeficiency virus (HIV) disease (HCC) - Continue follow up with ID as directed  Shirline Frees, NP

## 2016-05-06 ENCOUNTER — Other Ambulatory Visit: Payer: Self-pay | Admitting: Infectious Diseases

## 2016-05-06 DIAGNOSIS — B20 Human immunodeficiency virus [HIV] disease: Secondary | ICD-10-CM

## 2016-06-28 ENCOUNTER — Other Ambulatory Visit (INDEPENDENT_AMBULATORY_CARE_PROVIDER_SITE_OTHER): Payer: BLUE CROSS/BLUE SHIELD

## 2016-06-28 DIAGNOSIS — Z0000 Encounter for general adult medical examination without abnormal findings: Secondary | ICD-10-CM | POA: Diagnosis not present

## 2016-06-28 LAB — CBC WITH DIFFERENTIAL/PLATELET
Basophils Absolute: 0 10*3/uL (ref 0.0–0.1)
Basophils Relative: 0.5 % (ref 0.0–3.0)
Eosinophils Absolute: 0.1 10*3/uL (ref 0.0–0.7)
Eosinophils Relative: 1.5 % (ref 0.0–5.0)
HCT: 39.5 % (ref 36.0–46.0)
Hemoglobin: 12.9 g/dL (ref 12.0–15.0)
LYMPHS ABS: 1.6 10*3/uL (ref 0.7–4.0)
Lymphocytes Relative: 36.3 % (ref 12.0–46.0)
MCHC: 32.7 g/dL (ref 30.0–36.0)
MCV: 85.8 fl (ref 78.0–100.0)
MONO ABS: 0.4 10*3/uL (ref 0.1–1.0)
Monocytes Relative: 8 % (ref 3.0–12.0)
NEUTROS ABS: 2.4 10*3/uL (ref 1.4–7.7)
NEUTROS PCT: 53.7 % (ref 43.0–77.0)
Platelets: 268 10*3/uL (ref 150.0–400.0)
RBC: 4.6 Mil/uL (ref 3.87–5.11)
RDW: 14.4 % (ref 11.5–15.5)
WBC: 4.4 10*3/uL (ref 4.0–10.5)

## 2016-06-28 LAB — BASIC METABOLIC PANEL
BUN: 16 mg/dL (ref 6–23)
CALCIUM: 9.2 mg/dL (ref 8.4–10.5)
CO2: 31 mEq/L (ref 19–32)
CREATININE: 1.18 mg/dL (ref 0.40–1.20)
Chloride: 108 mEq/L (ref 96–112)
GFR: 62.34 mL/min (ref >60.00)
GLUCOSE: 94 mg/dL (ref 70–99)
Potassium: 3.4 mEq/L — ABNORMAL LOW (ref 3.5–5.1)
Sodium: 145 mEq/L (ref 135–145)

## 2016-06-28 LAB — POC URINALSYSI DIPSTICK (AUTOMATED)
BILIRUBIN UA: NEGATIVE
Blood, UA: NEGATIVE
Glucose, UA: NEGATIVE
KETONES UA: NEGATIVE
LEUKOCYTES UA: NEGATIVE
NITRITE UA: NEGATIVE
PH UA: 5.5
Spec Grav, UA: 1.025
Urobilinogen, UA: 0.2

## 2016-06-28 LAB — HEPATIC FUNCTION PANEL
ALK PHOS: 65 U/L (ref 39–117)
ALT: 8 U/L (ref 0–35)
AST: 14 U/L (ref 0–37)
Albumin: 3.9 g/dL (ref 3.5–5.2)
BILIRUBIN TOTAL: 0.4 mg/dL (ref 0.2–1.2)
Bilirubin, Direct: 0.1 mg/dL (ref 0.0–0.3)
Total Protein: 7 g/dL (ref 6.0–8.3)

## 2016-06-28 LAB — LIPID PANEL
CHOLESTEROL: 177 mg/dL (ref 0–200)
HDL: 50.5 mg/dL (ref >39.00)
LDL CALC: 111 mg/dL — AB (ref 0–99)
NonHDL: 126.2
Total CHOL/HDL Ratio: 3
Triglycerides: 74 mg/dL (ref 0.0–149.0)
VLDL: 14.8 mg/dL (ref 0.0–40.0)

## 2016-06-28 LAB — TSH: TSH: 0.95 u[IU]/mL (ref 0.35–4.50)

## 2016-07-04 ENCOUNTER — Other Ambulatory Visit: Payer: BLUE CROSS/BLUE SHIELD

## 2016-07-07 ENCOUNTER — Other Ambulatory Visit: Payer: Self-pay | Admitting: Infectious Diseases

## 2016-07-07 DIAGNOSIS — Z1231 Encounter for screening mammogram for malignant neoplasm of breast: Secondary | ICD-10-CM

## 2016-07-08 ENCOUNTER — Ambulatory Visit (INDEPENDENT_AMBULATORY_CARE_PROVIDER_SITE_OTHER): Payer: BLUE CROSS/BLUE SHIELD | Admitting: Adult Health

## 2016-07-08 ENCOUNTER — Encounter: Payer: Self-pay | Admitting: Adult Health

## 2016-07-08 VITALS — BP 110/64 | Ht 67.2 in | Wt 192.2 lb

## 2016-07-08 DIAGNOSIS — Z0000 Encounter for general adult medical examination without abnormal findings: Secondary | ICD-10-CM | POA: Diagnosis not present

## 2016-07-08 DIAGNOSIS — I10 Essential (primary) hypertension: Secondary | ICD-10-CM

## 2016-07-08 DIAGNOSIS — G43709 Chronic migraine without aura, not intractable, without status migrainosus: Secondary | ICD-10-CM | POA: Diagnosis not present

## 2016-07-08 DIAGNOSIS — Z1211 Encounter for screening for malignant neoplasm of colon: Secondary | ICD-10-CM

## 2016-07-08 DIAGNOSIS — F411 Generalized anxiety disorder: Secondary | ICD-10-CM | POA: Diagnosis not present

## 2016-07-08 MED ORDER — CLONAZEPAM 0.5 MG PO TABS: 0.5000 mg | ORAL_TABLET | Freq: Every day | ORAL | 0 refills | Status: DC | PRN

## 2016-07-08 MED ORDER — HYDROCHLOROTHIAZIDE 25 MG PO TABS: 25.0000 mg | ORAL_TABLET | Freq: Every day | ORAL | 3 refills | Status: DC

## 2016-07-08 NOTE — Progress Notes (Signed)
Subjective:    Patient ID: [name], female    DOB: [birth year], 50 y.o.   MRN: [id number]  HPI Patient presents for yearly preventative medicine examination. She is a pleasant 50 year old female who  has a past medical history of Anxiety; HIV (human immunodeficiency virus infection) (HCC); Hypertension; Insomnia; and Migraine.  All immunizations and health maintenance protocols were reviewed with the patient and needed orders were placed.  Medication reconciliation,  past medical history, social history, problem list and allergies were reviewed in detail with the patient  Goals were established with regard to weight loss, exercise, and  diet in compliance with medications. She does not eat healthy and does not exercise on a regular basis  She has her GYN exam next month with her gynecologist. She is up to date on vision and dental visits. She will have her mammogram in the spring  Migraines - She reports that she is getting migraines much less frequent then she used to. Currently she may have one a week. She takes Excedrin for her migraines which she endorses works well.   Hypertension - Currently takes 25 mg HCTZ. Feels as though her blood pressure is well controlled on this.   Review of Systems  Constitutional: Negative.   HENT: Negative.   Eyes: Negative.   Respiratory: Negative.   Cardiovascular: Negative.   Gastrointestinal: Positive for constipation.  Endocrine: Negative.   Genitourinary: Negative.   Musculoskeletal: Negative.   Skin: Negative.   Allergic/Immunologic: Negative.   Neurological: Negative.   Hematological: Negative.   Psychiatric/Behavioral: Negative.   All other systems reviewed and are negative.  Past Medical History:  Diagnosis Date  . Anxiety   . HIV (human immunodeficiency virus infection) (HCC)   . Hypertension   . Insomnia   . Migraine     Social History   Social History  . Marital status: Single    Spouse name: N/A  . Number of  children: 1  . 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 Smoking status: Never Smoker  . Smokeless tobacco: Never Used  . Alcohol use 0.0 oz/week     Comment: socially  . Drug use: No  . Sexual activity: Yes    Partners: Male    Birth control/ protection: Abstinence, Surgical   Other Topics Concern  . 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     Past Surgical History:  Procedure Laterality Date  . ABDOMINAL HYSTERECTOMY Right 2004   partial    Family History  Problem Relation Age of Onset  . Cerebral aneurysm Father [id number]  . Aortic aneurysm Father   . Diabetes    . Hypertension    . Brain cancer      Allergies  Allergen Reactions  . Sulfonamide Derivatives Hives    Current Outpatient Prescriptions on File Prior to Visit  Medication Sig Dispense Refill  . DESCOVY 200-25 MG tablet TAKE 1 TABLET DAILY. 90 tablet 1  . SUMAtriptan (IMITREX) 100 MG tablet Take 1 tablet (100 mg total) by mouth once as needed. 10 tablet 3  . TIVICAY 50 MG tablet Take 1 tablet (50 mg total) by mouth daily. 90 tablet 1   No  current facility-administered medications on file prior to visit.     BP 110/64   Ht 5' 7.2" (1.707 m)   Wt 192 lb 3.2 oz (87.2 kg)   BMI 29.92 kg/m       Objective:   Physical Exam  Constitutional: She is oriented to person, place, and time. She appears well-developed and well-nourished. No distress.  HENT:  Head: Normocephalic and atraumatic.  Right Ear: External ear normal.  Left Ear: External ear normal.  Nose: Nose normal.  Mouth/Throat: Oropharynx is clear and moist. No oropharyngeal exudate.  Eyes: Conjunctivae are normal. Pupils are equal, round, and reactive to light. Right eye exhibits no discharge. Left eye exhibits no  discharge. No scleral icterus.  Neck: Normal range of motion. Neck supple. No JVD present. No tracheal tenderness present. Carotid bruit is not present. No tracheal deviation present. No thyroid mass and no thyromegaly present.  Cardiovascular: Normal rate, regular rhythm, normal heart sounds and intact distal pulses.  Exam reveals no gallop and no friction rub.   No murmur heard. Pulmonary/Chest: Effort normal and breath sounds normal. No stridor. No respiratory distress. She has no wheezes. She has no rales. She exhibits no tenderness.  Abdominal: Soft. Bowel sounds are normal. She exhibits no distension and no mass. There is no tenderness. There is no rebound and no guarding.  Musculoskeletal: Normal range of motion. She exhibits no edema, tenderness or deformity.  Lymphadenopathy:    She has no cervical adenopathy.  Neurological: She is alert and oriented to person, place, and time. She has normal reflexes. She displays normal reflexes. No cranial nerve deficit. She exhibits normal muscle tone. Coordination normal.  Skin: Skin is warm and dry. No rash noted. No erythema. No pallor.  Psychiatric: She has a normal mood and affect. Her behavior is normal. Judgment and thought content normal.  Nursing note and vitals reviewed.     Assessment & Plan:  1. Routine general medical examination at a health care facility - Reviewed labs with patient in detail. All questions answered.  - Educated on the importance of diet and exercise - Follow up in one year or sooner if needed  2. Colon cancer screening - Ambulatory referral to Gastroenterology  3. Chronic migraine without aura without status migrainosus, not intractable - Well controlled with Excedrin   4. Anxiety state - clonazePAM (KLONOPIN) 0.5 MG tablet; Take 1 tablet (0.5 mg total) by mouth daily as needed for anxiety.  Dispense: 30 tablet; Refill: 0  5. Essential hypertension - BP well controlled. I am ok with her splitting her HCTZ in  half. She needs to monitor blood pressure at home. If BP goes above 130 then restart full dose.  - hydrochlorothiazide (HYDRODIURIL) 25 MG tablet; Take 1 tablet (25 mg total) by mouth daily.  Dispense: 90 tablet; Refill: 3  Shirline Freesory Sanjiv Castorena, NP

## 2016-07-19 ENCOUNTER — Other Ambulatory Visit: Payer: Self-pay | Admitting: Family

## 2016-08-15 ENCOUNTER — Encounter: Payer: Self-pay | Admitting: Adult Health

## 2016-08-15 DIAGNOSIS — N951 Menopausal and female climacteric states: Secondary | ICD-10-CM | POA: Diagnosis not present

## 2016-08-15 DIAGNOSIS — R232 Flushing: Secondary | ICD-10-CM | POA: Diagnosis not present

## 2016-08-15 DIAGNOSIS — Z01419 Encounter for gynecological examination (general) (routine) without abnormal findings: Secondary | ICD-10-CM | POA: Diagnosis not present

## 2016-08-15 DIAGNOSIS — Z6830 Body mass index (BMI) 30.0-30.9, adult: Secondary | ICD-10-CM | POA: Diagnosis not present

## 2016-10-28 ENCOUNTER — Other Ambulatory Visit: Payer: Self-pay | Admitting: Infectious Diseases

## 2016-10-28 DIAGNOSIS — B20 Human immunodeficiency virus [HIV] disease: Secondary | ICD-10-CM

## 2016-12-12 ENCOUNTER — Other Ambulatory Visit: Payer: BLUE CROSS/BLUE SHIELD

## 2016-12-12 DIAGNOSIS — Z113 Encounter for screening for infections with a predominantly sexual mode of transmission: Secondary | ICD-10-CM

## 2016-12-12 DIAGNOSIS — B20 Human immunodeficiency virus [HIV] disease: Secondary | ICD-10-CM | POA: Diagnosis not present

## 2016-12-12 LAB — CBC WITH DIFFERENTIAL/PLATELET
BASOS ABS: 0 {cells}/uL (ref 0–200)
BASOS PCT: 0 %
EOS ABS: 90 {cells}/uL (ref 15–500)
Eosinophils Relative: 2 %
HCT: 40.1 % (ref 35.0–45.0)
Hemoglobin: 12.8 g/dL (ref 11.7–15.5)
LYMPHS PCT: 44 %
Lymphs Abs: 1980 cells/uL (ref 850–3900)
MCH: 27.7 pg (ref 27.0–33.0)
MCHC: 31.9 g/dL — AB (ref 32.0–36.0)
MCV: 86.8 fL (ref 80.0–100.0)
MONO ABS: 360 {cells}/uL (ref 200–950)
MONOS PCT: 8 %
MPV: 9 fL (ref 7.5–12.5)
NEUTROS PCT: 46 %
Neutro Abs: 2070 cells/uL (ref 1500–7800)
Platelets: 290 10*3/uL (ref 140–400)
RBC: 4.62 MIL/uL (ref 3.80–5.10)
RDW: 15.2 % — AB (ref 11.0–15.0)
WBC: 4.5 10*3/uL (ref 3.8–10.8)

## 2016-12-12 LAB — COMPREHENSIVE METABOLIC PANEL
ALK PHOS: 64 U/L (ref 33–130)
ALT: 8 U/L (ref 6–29)
AST: 14 U/L (ref 10–35)
Albumin: 3.8 g/dL (ref 3.6–5.1)
BUN: 13 mg/dL (ref 7–25)
CALCIUM: 8.8 mg/dL (ref 8.6–10.4)
CHLORIDE: 108 mmol/L (ref 98–110)
CO2: 30 mmol/L (ref 20–31)
Creat: 1.17 mg/dL — ABNORMAL HIGH (ref 0.50–1.05)
GLUCOSE: 82 mg/dL (ref 65–99)
POTASSIUM: 4 mmol/L (ref 3.5–5.3)
Sodium: 142 mmol/L (ref 135–146)
Total Bilirubin: 0.4 mg/dL (ref 0.2–1.2)
Total Protein: 6.8 g/dL (ref 6.1–8.1)

## 2016-12-13 LAB — RPR

## 2016-12-13 LAB — T-HELPER CELL (CD4) - (RCID CLINIC ONLY)
CD4 % Helper T Cell: 35 % (ref 33–55)
CD4 T CELL ABS: 690 /uL (ref 400–2700)

## 2016-12-14 LAB — HIV-1 RNA QUANT-NO REFLEX-BLD
HIV 1 RNA Quant: <20 copies/mL
HIV-1 RNA Quant, Log: <1.3 Log copies/mL

## 2017-01-16 ENCOUNTER — Ambulatory Visit
Admission: RE | Admit: 2017-01-16 | Discharge: 2017-01-16 | Disposition: A | Discharge status: Home / Self Care | Payer: BLUE CROSS/BLUE SHIELD | Source: Ambulatory Visit | Attending: Infectious Diseases | Admitting: Infectious Diseases

## 2017-01-16 DIAGNOSIS — Z1231 Encounter for screening mammogram for malignant neoplasm of breast: Secondary | ICD-10-CM

## 2017-02-02 ENCOUNTER — Other Ambulatory Visit: Payer: Self-pay | Admitting: Infectious Diseases

## 2017-02-02 DIAGNOSIS — B20 Human immunodeficiency virus [HIV] disease: Secondary | ICD-10-CM

## 2017-02-06 ENCOUNTER — Ambulatory Visit (INDEPENDENT_AMBULATORY_CARE_PROVIDER_SITE_OTHER): Payer: BLUE CROSS/BLUE SHIELD | Admitting: Infectious Diseases

## 2017-02-06 ENCOUNTER — Encounter: Payer: Self-pay | Admitting: Infectious Diseases

## 2017-02-06 VITALS — BP 154/95 | HR 69 | Temp 98.6°F | Wt 198.0 lb

## 2017-02-06 DIAGNOSIS — B20 Human immunodeficiency virus [HIV] disease: Secondary | ICD-10-CM | POA: Diagnosis not present

## 2017-02-06 DIAGNOSIS — G43001 Migraine without aura, not intractable, with status migrainosus: Secondary | ICD-10-CM

## 2017-02-06 DIAGNOSIS — Z23 Encounter for immunization: Secondary | ICD-10-CM

## 2017-02-06 DIAGNOSIS — Z780 Asymptomatic menopausal state: Secondary | ICD-10-CM | POA: Insufficient documentation

## 2017-02-06 MED ORDER — ONDANSETRON 4 MG PO TBDP: 4.0000 mg | ORAL_TABLET | Freq: Three times a day (TID) | ORAL | 0 refills | Status: DC | PRN

## 2017-02-06 MED ORDER — BICTEGRAVIR-EMTRICITAB-TENOFOV 50-200-25 MG PO TABS: 1.0000 | ORAL_TABLET | Freq: Every day | ORAL | 3 refills | Status: DC

## 2017-02-06 MED ORDER — BICTEGRAVIR-EMTRICITAB-TENOFOV 50-200-25 MG PO TABS: 1.0000 | ORAL_TABLET | Freq: Every day | ORAL | 11 refills | Status: DC

## 2017-02-06 MED FILL — BIKTARVY 50-200-25 MG TABS: 50-200-25 | 30 days supply | Qty: 30 | Fill #0

## 2017-02-06 NOTE — Progress Notes (Signed)
   Subjective:    Patient ID: [name], female    DOB: 1965-11-14, 51 y.o.   MRN: [id number]  HPI 51 yo F with hx of HIV+, was previously on TRV/NFV then switched to complera for simplicity. 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 In February 2013 was changed to DTGV/TRV. Doing well with her ART.  Has been gaining wt, having peri-menopausal sx. Was started on HRT patch. Up 17# since 2015.  Still exercising. Her eating habits have not changed.  She is scheduled for colon.  Had PAP 08-15-16.   HIV 1 RNA Quant (copies/mL)  Date Value  12/12/2016 <20 NOT DETECTED  03/30/2015 <20  10/27/2014 <20   CD4 T Cell Abs (/uL)  Date Value  12/12/2016 690  03/30/2015 950  10/27/2014 970     Review of Systems  Constitutional: Negative for appetite change and unexpected weight change.  Respiratory: Positive for cough. Negative for shortness of breath.   Gastrointestinal: Positive for nausea. Negative for constipation and diarrhea.  Genitourinary: Negative for difficulty urinating.  Neurological: Positive for headaches. Negative for dizziness.  Hematological: Negative for adenopathy. Does not bruise/bleed easily.  had ? Allergy related cough recently.  Has headache with nausea q2 weeks. Has taken excedrin migraine, imitrex (has emesis prior to it working).     Objective:   Physical Exam  Constitutional: She is oriented to person, place, and time. She appears well-developed and well-nourished.  HENT:  Mouth/Throat: No oropharyngeal exudate.  Eyes: EOM are normal. Pupils are equal, round, and reactive to light.  Neck: Neck supple.  Cardiovascular: Normal rate, regular rhythm and normal heart sounds.   Pulmonary/Chest: Effort normal and breath sounds normal.  Abdominal: Soft. Bowel sounds are normal. There is no tenderness.  Musculoskeletal: She exhibits no edema.  Lymphadenopathy:    She has no cervical adenopathy.    Neurological: She is alert and oriented to person, place, and time.  Psychiatric: She has a normal mood and affect.      Assessment & Plan:

## 2017-02-06 NOTE — Assessment & Plan Note (Signed)
Appreciate Dr J-M's partnering.

## 2017-02-06 NOTE — Assessment & Plan Note (Signed)
Will add zofran Offered to have her seen by neuro.  She wants to see if these are related to her hormone changes.

## 2017-02-06 NOTE — Assessment & Plan Note (Addendum)
Will change her to biktarvy Will give her PCV 13 --> 23 (we don't have PCV 13) Give her menveo Will have her seen back in 3 months, pharm in 6-8 weeks.  Offered/refused condoms.

## 2017-02-10 ENCOUNTER — Telehealth: Payer: Self-pay

## 2017-02-10 DIAGNOSIS — R42 Dizziness and giddiness: Secondary | ICD-10-CM | POA: Diagnosis not present

## 2017-02-10 NOTE — Telephone Encounter (Signed)
Pt called with c/o waking early this AM with nausea, vertigo and left sided leaning. She reports swaying when she walks and still having vertigo with her eyes closed. She does have significant family hx for brain tumors and CVA's. She denies any migraine today or any changes in her BP. Advised pt she needs to be evaluated due to continuing symptoms at either UC or ED. Pt states she is at work in [place] and has no way to leave. Advised she needs to find someone who can drive her to local UC/ED as soon as possible for evaluation. She agrees and will find a friend or co-worker to take her. Also let pt know we have availability to [facility] so can see all notes from her visit if she needs follow up here. Pt voiced understanding and agrees to be seen and evaluated asap.  Cory - FYI. Thanks!

## 2017-02-13 ENCOUNTER — Telehealth: Payer: Self-pay | Admitting: Adult Health

## 2017-02-13 NOTE — Telephone Encounter (Signed)
Noted  

## 2017-02-13 NOTE — Telephone Encounter (Signed)
Patient Name: Mercedes MartNGELA [name]  DOB: [birth year]    Initial Comment Caller states she is lightheaded and dizzy. Friday RN triaged her to ED/UC, went to UC.    Nurse Assessment  Nurse: Scarlette ArStandifer, RN, Heather Date/Time (Eastern Time): 02/13/2017 8:34:53 AM  Confirm and document reason for call. If symptomatic, describe symptoms. ---Caller states she is lightheaded and dizzy. Friday RN triaged her to ED/UC, went to UC. She was prescribed meclizine on Friday, she was diagnosed with possible vertigo  Does the patient have any new or worsening symptoms? ---Yes  Will a triage be completed? ---Yes  Related visit to physician within the last 2 weeks? ---Yes  Does the PT have any chronic conditions? (i.e. diabetes, asthma, etc.) ---Yes  List chronic conditions. ---See MR  Is the patient pregnant or possibly pregnant? (Ask all females between the ages of 2412-55) ---No  Is this a behavioral health or substance abuse call? ---No     Guidelines    Guideline Title Affirmed Question Affirmed Notes  Dizziness - Vertigo [1] MODERATE dizziness (e.g., vertigo; feels very unsteady, interferes with normal activities) AND [2] has been evaluated by physician for this    Final Disposition User   See PCP When Office is Open (within 3 days) Standifer, RN, Herbert SetaHeather    Comments  Appt with Shirline Freesory Nafziger tomorrow at 11 am.   Referrals  REFERRED TO PCP OFFICE   Disagree/Comply: Comply

## 2017-02-14 ENCOUNTER — Ambulatory Visit (INDEPENDENT_AMBULATORY_CARE_PROVIDER_SITE_OTHER): Payer: BLUE CROSS/BLUE SHIELD | Admitting: Adult Health

## 2017-02-14 ENCOUNTER — Encounter: Payer: Self-pay | Admitting: Adult Health

## 2017-02-14 ENCOUNTER — Other Ambulatory Visit: Payer: Self-pay | Admitting: Adult Health

## 2017-02-14 VITALS — BP 112/72 | Temp 98.1°F | Ht 67.2 in | Wt 197.3 lb

## 2017-02-14 DIAGNOSIS — H8113 Benign paroxysmal vertigo, bilateral: Secondary | ICD-10-CM

## 2017-02-14 DIAGNOSIS — F411 Generalized anxiety disorder: Secondary | ICD-10-CM

## 2017-02-14 MED ORDER — MECLIZINE HCL 25 MG PO TABS: 25.0000 mg | ORAL_TABLET | Freq: Three times a day (TID) | ORAL | 0 refills | Status: DC | PRN

## 2017-02-14 MED ORDER — CLONAZEPAM 0.5 MG PO TABS: 0.5000 mg | ORAL_TABLET | Freq: Every day | ORAL | 0 refills | Status: DC | PRN

## 2017-02-14 NOTE — Progress Notes (Signed)
Subjective:    Patient ID: [name], female    DOB: May 26, 1966, 51 y.o.   MRN: [id number]  HPI  51 year old female who  has a past medical history of Anxiety; HIV (human immunodeficiency virus infection) (HCC); Hypertension; Insomnia; and Migraine.  [facility] [place] and diagnosed with Vertigo. She reports that she was given a prescription for Meclizine. Today in the office she reports that she continues to have dizziness when she moves her head side to side and up and down. Also has symptoms when she lies down. Her symptoms are improving. She is taking Meclizine at night as it makes her drowsy.   She is taking Zofran as needed for nausea.   Review of Systems See HPI   Past Medical History:  Diagnosis Date  . Anxiety   . HIV (human immunodeficiency virus infection) (HCC)   . Hypertension   . Insomnia   . Migraine     Social History   Social History  . Marital status: Single    Spouse name: N/A  . Number of children: 1  . 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 Smoking status: Never Smoker  . Smokeless tobacco: Never Used  . Alcohol use 0.0 oz/week     Comment: socially  . Drug use: No  . Sexual activity: Yes    Partners: Male    Birth control/ protection: Abstinence, Surgical   Other Topics Concern  . 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     Past Surgical History:  Procedure Laterality Date  . ABDOMINAL HYSTERECTOMY Right 2004   partial    Family History  Problem Relation Age of Onset  . Cerebral aneurysm Father [id number]  . Aortic aneurysm Father   . Diabetes Unknown   . Hypertension Unknown   . Brain cancer Unknown   . Breast cancer Neg Hx     Allergies  Allergen Reactions  . Sulfonamide  Derivatives Hives    Current Outpatient Prescriptions on File Prior to Visit  Medication Sig Dispense Refill  . bictegravir-emtricitabine-tenofovir AF (BIKTARVY) 50-200-25 MG TABS tablet Take 1 tablet by mouth daily. 90 tablet 3  . hydrochlorothiazide (HYDRODIURIL) 25 MG tablet TAKE ONE TABLET BY MOUTH ONCE DAILY 90 tablet 1  . ondansetron (ZOFRAN ODT) 4 MG disintegrating tablet Take 1 tablet (4 mg total) by mouth every 8 (eight) hours as needed for nausea or vomiting. 20 tablet 0  . SUMAtriptan (IMITREX) 100 MG tablet Take 1 tablet (100 mg total) by mouth once as needed. 10 tablet 3   No current facility-administered medications on file prior to visit.     BP 112/72 (BP Location: Left Arm, Patient Position: Sitting, Cuff Size: Normal)   Temp 98.1 F (36.7 C) (Oral)   Ht 5' 7.2" (1.707 m)   Wt 197 lb 4.8 oz (89.5 kg)   BMI 30.72 kg/m       Objective:   Physical Exam  Constitutional: She is oriented to person, place, and time. She appears well-developed and well-nourished. No distress.  HENT:  Head: Normocephalic and atraumatic.  Right Ear: External ear normal.  Left Ear: External ear normal.  Nose: Nose  normal.  Mouth/Throat: Oropharynx is clear and moist. No oropharyngeal exudate.  Eyes: Conjunctivae are normal. Pupils are equal, round, and reactive to light. Right eye exhibits no discharge. Left eye exhibits no discharge. No scleral icterus. Right eye exhibits nystagmus (horizontal ). Left eye exhibits nystagmus (horizontal ).  Neck: Normal range of motion. Neck supple. No thyromegaly present.  Cardiovascular: Normal rate, regular rhythm, normal heart sounds and intact distal pulses.  Exam reveals no gallop and no friction rub.   No murmur heard. Pulmonary/Chest: Effort normal and breath sounds normal. No respiratory distress. She has no wheezes. She has no rales. She exhibits no tenderness.  Lymphadenopathy:    She has no cervical adenopathy.  Neurological: She is alert and  oriented to person, place, and time.  She became dizzy with moving head vertically and horizontally.   Skin: Skin is warm and dry. No rash noted. She is not diaphoretic. No erythema. No pallor.  Psychiatric: She has a normal mood and affect. Her behavior is normal. Judgment and thought content normal.  Nursing note and vitals reviewed.     Assessment & Plan:  1. Benign paroxysmal positional vertigo due to bilateral vestibular disorder - Instructions on home eply Maneuver given to patient.  - meclizine (ANTIVERT) 25 MG tablet; Take 1 tablet (25 mg total) by mouth 3 (three) times daily as needed for dizziness.  Dispense: 30 tablet; Refill: 0 - Follow up if no improvement  - stay hydrated   2. Anxiety state - clonazePAM (KLONOPIN) 0.5 MG tablet; Take 1 tablet (0.5 mg total) by mouth daily as needed for anxiety.  Dispense: 30 tablet; Refill: 0   Shirline Frees, NP

## 2017-03-13 ENCOUNTER — Ambulatory Visit: Payer: BLUE CROSS/BLUE SHIELD

## 2017-03-14 ENCOUNTER — Encounter: Payer: Self-pay | Admitting: Internal Medicine

## 2017-03-15 MED FILL — BIKTARVY 50-200-25 MG TABS: 50-200-25 | 30 days supply | Qty: 30 | Fill #1

## 2017-03-27 ENCOUNTER — Ambulatory Visit (INDEPENDENT_AMBULATORY_CARE_PROVIDER_SITE_OTHER): Payer: BLUE CROSS/BLUE SHIELD | Admitting: Adult Health

## 2017-03-27 ENCOUNTER — Telehealth: Payer: Self-pay | Admitting: Pharmacist Clinician (PhC)/ Clinical Pharmacy Specialist

## 2017-03-27 ENCOUNTER — Telehealth: Payer: Self-pay | Admitting: Adult Health

## 2017-03-27 VITALS — BP 146/100 | Temp 97.6°F | Wt 196.0 lb

## 2017-03-27 DIAGNOSIS — M5412 Radiculopathy, cervical region: Secondary | ICD-10-CM

## 2017-03-27 DIAGNOSIS — R42 Dizziness and giddiness: Secondary | ICD-10-CM | POA: Diagnosis not present

## 2017-03-27 DIAGNOSIS — H35712 Central serous chorioretinopathy, left eye: Secondary | ICD-10-CM | POA: Diagnosis not present

## 2017-03-27 MED ORDER — CYCLOBENZAPRINE HCL 10 MG PO TABS: 10.0000 mg | ORAL_TABLET | Freq: Three times a day (TID) | ORAL | 0 refills | Status: DC | PRN

## 2017-03-27 MED ORDER — METHYLPREDNISOLONE 4 MG PO TBPK: ORAL_TABLET | ORAL | 0 refills | Status: DC

## 2017-03-27 NOTE — Telephone Encounter (Signed)
Called and spoke to the pt.  [facility] pt to follow up in office if dizziness continues.  Scheduled today at 2:30 PM.

## 2017-03-27 NOTE — Telephone Encounter (Signed)
[name] called to ask if Mercedes BordersBiktarvy could contribute to some of her vision changes. Apparently, she had a hx of iritis.  Looked up to see if Mercedes BordersBiktarvy has and mention of potential side effects. Not even listed at all. Advised her that this very unlikely due to [place]. She has an appt with his opthalmologist tomorrow.

## 2017-03-27 NOTE — Progress Notes (Signed)
Subjective:    Patient ID: [name], female    DOB: Dec 13, 1965, 51 y.o.   MRN: [id number]  HPI  51 year old female who  has a past medical history of Anxiety; HIV (human immunodeficiency virus infection) (HCC); Hypertension; Insomnia; and Migraine. She presents to the office today for follow up on vertigo like symptoms. I last saw her for this on 02/14/2017 at which time she had been seen in the ER prior to seeing me and was prescribed Meclizine, which she reports had helped. Today in the office she reports that she is no longer feeling as though the room is spinning but continues to feel dizzy, especially when she lays down or gets up out of bed.    She also reports that for the last 3-4 weeks she has been experiencing pain in her left shoulder with a numbness and tingling that radiates down her left arm. She denies any changes in grip strength. Over this same time, she has had some vision changes where she feels as though her " vision is not lining up."   Review of Systems See HPI   Past Medical History:  Diagnosis Date  . Anxiety   . HIV (human immunodeficiency virus infection) (HCC)   . Hypertension   . Insomnia   . Migraine     Social History   Social History  . Marital status: Single    Spouse name: N/A  . Number of children: 1  . 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 Smoking status: Never Smoker  . Smokeless tobacco: Never Used  . Alcohol use 0.0 oz/week     Comment: socially  . Drug use: No  . Sexual activity: Yes    Partners: Male    Birth control/ protection: Abstinence, Surgical   Other Topics Concern  . 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     Past Surgical History:  Procedure Laterality Date  . ABDOMINAL  HYSTERECTOMY Right 2004   partial    Family History  Problem Relation Age of Onset  . Cerebral aneurysm Father [id number]  . Aortic aneurysm Father   . Diabetes Unknown   . Hypertension Unknown   . Brain cancer Unknown   . Breast cancer Neg Hx     Allergies  Allergen Reactions  . Iodinated Diagnostic Agents Other (See Comments)    Pain in my eyes .   Marland Kitchen. Sulfonamide Derivatives Hives    Current Outpatient Prescriptions on File Prior to Visit  Medication Sig Dispense Refill  . bictegravir-emtricitabine-tenofovir AF (BIKTARVY) 50-200-25 MG TABS tablet Take 1 tablet by mouth daily. 90 tablet 3  . clonazePAM (KLONOPIN) 0.5 MG tablet Take 1 tablet (0.5 mg total) by mouth daily as needed for anxiety. 30 tablet 0  . hydrochlorothiazide (HYDRODIURIL) 25 MG tablet TAKE ONE TABLET BY MOUTH ONCE DAILY 90 tablet 1  . meclizine (ANTIVERT) 25 MG tablet Take 1 tablet (25 mg total) by mouth 3 (three) times daily as needed for dizziness. 30 tablet 0  . ondansetron (ZOFRAN ODT) 4 MG disintegrating tablet Take 1 tablet (4 mg total) by mouth every 8 (eight) hours as needed for nausea or vomiting. 20 tablet 0  .  SUMAtriptan (IMITREX) 100 MG tablet Take 1 tablet (100 mg total) by mouth once as needed. 10 tablet 3   No current facility-administered medications on file prior to visit.     BP (!) 146/100   Temp 97.6 F (36.4 C) (Oral)   Wt 196 lb (88.9 kg)   BMI 30.52 kg/m       Objective:   Physical Exam  Constitutional: She is oriented to person, place, and time. She appears well-developed and well-nourished. No distress.  HENT:  Head: Normocephalic and atraumatic.  Right Ear: External ear normal.  Left Ear: External ear normal.  Nose: Nose normal.  Mouth/Throat: Oropharynx is clear and moist. No oropharyngeal exudate.  Eyes: Pupils are equal, round, and reactive to light. Conjunctivae and EOM are normal. Right eye exhibits no discharge. Left eye exhibits no discharge. No scleral icterus.  Neck:  Normal range of motion. Neck supple.  Cardiovascular: Normal rate, regular rhythm, normal heart sounds and intact distal pulses.  Exam reveals no gallop and no friction rub.   No murmur heard. Pulmonary/Chest: Effort normal and breath sounds normal. No respiratory distress. She has no wheezes. She has no rales. She exhibits no tenderness.  Musculoskeletal: Normal range of motion. She exhibits tenderness (along left scapula ). She exhibits no edema or deformity.  Neurological: She is alert and oriented to person, place, and time.  Skin: Skin is warm and dry. No rash noted. She is not diaphoretic. No erythema. No pallor.  Psychiatric: She has a normal mood and affect. Her behavior is normal. Judgment and thought content normal.  Nursing note and vitals reviewed.     Assessment & Plan:  1. Dizziness - No increased dizziness with head movements or change in position in the office today. Will get CT of head. Possibly from pinched nerve?  - CT HEAD WO CONTRAST; Future  2. Cervical radiculopathy  - methylPREDNISolone (MEDROL DOSEPAK) 4 MG TBPK tablet; Take as directed  Dispense: 21 tablet; Refill: 0 - cyclobenzaprine (FLEXERIL) 10 MG tablet; Take 1 tablet (10 mg total) by mouth 3 (three) times daily as needed for muscle spasms.  Dispense: 30 tablet; Refill: 0 - Motrin and a heating pad will help   Shirline Frees, NP

## 2017-03-27 NOTE — Telephone Encounter (Signed)
° ° °  Pt call to say she was told to call back if she was still having dizzyness and a CT scan would be ordered. She call to say she is still having the issue and has also made an appt to see a opthamologist

## 2017-03-27 NOTE — Telephone Encounter (Signed)
Thanks

## 2017-03-28 ENCOUNTER — Telehealth: Payer: Self-pay | Admitting: Adult Health

## 2017-03-28 NOTE — Telephone Encounter (Signed)
Pt went yesterday to [facility] saw Dr. Georges Mousehristopher Shah and was Dx with/CSCR w/fluid behind her R eye and have a follow up April 21, 2017

## 2017-03-29 ENCOUNTER — Inpatient Hospital Stay: Admission: RE | Admit: 2017-03-29 | Payer: BLUE CROSS/BLUE SHIELD | Source: Ambulatory Visit

## 2017-03-31 ENCOUNTER — Ambulatory Visit (INDEPENDENT_AMBULATORY_CARE_PROVIDER_SITE_OTHER)
Admission: RE | Admit: 2017-03-31 | Discharge: 2017-03-31 | Disposition: A | Discharge status: Home / Self Care | Payer: BLUE CROSS/BLUE SHIELD | Source: Ambulatory Visit | Attending: Adult Health | Admitting: Adult Health

## 2017-03-31 DIAGNOSIS — R42 Dizziness and giddiness: Secondary | ICD-10-CM | POA: Diagnosis not present

## 2017-04-03 ENCOUNTER — Ambulatory Visit (INDEPENDENT_AMBULATORY_CARE_PROVIDER_SITE_OTHER): Payer: BLUE CROSS/BLUE SHIELD | Admitting: Pharmacist Clinician (PhC)/ Clinical Pharmacy Specialist

## 2017-04-03 DIAGNOSIS — B20 Human immunodeficiency virus [HIV] disease: Secondary | ICD-10-CM

## 2017-04-03 LAB — BASIC METABOLIC PANEL
BUN: 15 mg/dL (ref 7–25)
CO2: 28 mmol/L (ref 20–31)
Calcium: 8.8 mg/dL (ref 8.6–10.4)
Chloride: 105 mmol/L (ref 98–110)
Creat: 1.12 mg/dL — ABNORMAL HIGH (ref 0.50–1.05)
Glucose, Bld: 95 mg/dL (ref 65–99)
Potassium: 3.5 mmol/L (ref 3.5–5.3)
SODIUM: 143 mmol/L (ref 135–146)

## 2017-04-03 NOTE — Progress Notes (Signed)
HPI: [name] is a 51 y.o. female who is here for her visit with pharmacy for a follow up.   Allergies: Allergies  Allergen Reactions  . Iodinated Diagnostic Agents Other (See Comments)    Pain in my eyes .   Marland Kitchen. Sulfonamide Derivatives Hives    Vitals:    Past Medical History: Past Medical History:  Diagnosis Date  . Anxiety   . HIV (human immunodeficiency virus infection) (HCC)   . Hypertension   . Insomnia   . Migraine     Social History: Social History   Social History  . Marital status: Single    Spouse name: N/A  . Number of children: 1  . 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 Smoking status: Never Smoker  . Smokeless tobacco: Never Used  . Alcohol use 0.0 oz/week     Comment: socially  . Drug use: No  . Sexual activity: Yes    Partners: Male    Birth control/ protection: Abstinence, Surgical   Other Topics Concern  . 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     Previous Regimen: TRV/NFV, Completra  Current Regimen: Biktarvy  Labs: HIV 1 RNA Quant (copies/mL)  Date Value  12/12/2016 <20 NOT DETECTED  03/30/2015 <20  10/27/2014 <20   CD4 T Cell Abs (/uL)  Date Value  12/12/2016 690  03/30/2015 950  10/27/2014 970   Hep B S Ab (no units)  Date Value  10/30/2006 YES   Hepatitis B Surface Ag (no units)  Date Value  10/30/2006 NO   HCV Ab (no units)  Date Value  10/30/2006 No    CrCl: CrCl cannot be calculated (Patient's most recent lab result is older than the maximum 21 days allowed.).  Lipids:    Component Value Date/Time   CHOL 177 06/28/2016 0946   TRIG 74.0 06/28/2016 0946   HDL 50.50 06/28/2016 0946   CHOLHDL 3 06/28/2016 0946   VLDL 14.8 06/28/2016 0946   LDLCALC 111 (H) 06/28/2016  0946    Assessment: [name] is doing well on the new regimen Biktarvy. 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 She has an upcoming procedure for the eye issue. We were going to give her second Menveo vaccine today but she would rather wait until the appt with Dr. Ninetta LightsHatcher in Oct because of all of the issues that she is dealing with right now. It would be fine to give it at that time. We are going to repeat her labs today. [facility].  Recommendations:  HIV labs Continue Biktarvy Second Menveo in Oct   Ledarrius Beauchaine, PharmD, BCPS, AAHIVP, CPP Clinical Infectious Disease Pharmacist [facility] 04/03/2017, 2:18 PM

## 2017-04-03 NOTE — Patient Instructions (Addendum)
Continue Biktarvy 1 daily Labs today Follow up with Dr. Ninetta LightsHatcher in Oct

## 2017-04-04 NOTE — Telephone Encounter (Signed)
Pt notified of normal CT results.  Advised to call back if needed.

## 2017-04-04 NOTE — Telephone Encounter (Signed)
Pt calling in to get CT results and state that you can call her at her work number [phone].

## 2017-04-06 LAB — HIV-1 RNA QUANT-NO REFLEX-BLD
HIV 1 RNA Quant: <20 {copies}/mL — AB
HIV-1 RNA Quant, Log: <1.3 {Log_copies}/mL — AB

## 2017-04-14 MED FILL — BIKTARVY 50-200-25 MG TABS: 50-200-25 | 30 days supply | Qty: 30 | Fill #2

## 2017-04-21 ENCOUNTER — Ambulatory Visit (AMBULATORY_SURGERY_CENTER): Payer: Self-pay | Admitting: *Deleted

## 2017-04-21 VITALS — Ht 67.5 in | Wt 199.4 lb

## 2017-04-21 DIAGNOSIS — Z1211 Encounter for screening for malignant neoplasm of colon: Secondary | ICD-10-CM

## 2017-04-21 DIAGNOSIS — H35712 Central serous chorioretinopathy, left eye: Secondary | ICD-10-CM | POA: Diagnosis not present

## 2017-04-21 NOTE — Progress Notes (Signed)
No egg or soy allergy  No diet medications taken  No hx of sleep apnea or home oxygen use  No intubation or anesthesia problems per pt

## 2017-05-09 MED FILL — BIKTARVY 50-200-25 MG TABS: 50-200-25 | 30 days supply | Qty: 30 | Fill #3

## 2017-05-11 ENCOUNTER — Encounter: Payer: Self-pay | Admitting: Internal Medicine

## 2017-05-19 ENCOUNTER — Encounter: Payer: BLUE CROSS/BLUE SHIELD | Admitting: Internal Medicine

## 2017-05-26 ENCOUNTER — Encounter: Payer: Self-pay | Admitting: Adult Health

## 2017-06-05 ENCOUNTER — Ambulatory Visit: Payer: BLUE CROSS/BLUE SHIELD | Admitting: Infectious Diseases

## 2017-06-08 ENCOUNTER — Encounter: Payer: Self-pay | Admitting: Adult Health

## 2017-06-08 ENCOUNTER — Encounter: Payer: Self-pay | Admitting: Family Medicine

## 2017-06-08 ENCOUNTER — Ambulatory Visit (INDEPENDENT_AMBULATORY_CARE_PROVIDER_SITE_OTHER): Payer: BLUE CROSS/BLUE SHIELD | Admitting: Adult Health

## 2017-06-08 VITALS — BP 130/82 | Temp 98.2°F | Wt 197.0 lb

## 2017-06-08 DIAGNOSIS — R42 Dizziness and giddiness: Secondary | ICD-10-CM | POA: Diagnosis not present

## 2017-06-08 DIAGNOSIS — R002 Palpitations: Secondary | ICD-10-CM | POA: Diagnosis not present

## 2017-06-08 NOTE — Progress Notes (Signed)
Subjective:    Patient ID: [name], female    DOB: September 13, 1965, 51 y.o.   MRN: [id number]  HPI  51 year old female who who  has a past medical history of Allergy; Anxiety; HIV (human immunodeficiency virus infection) (HCC); Hypertension; Insomnia; and Migraine.  She presents to the office today for concern of dizziness that has been present since mid June and heart palpitations that that been present for one month. She is concerned that her symptoms may be stemming from a new " smart meter" that was installed by [employer]. This meter is located outside of her bedroom window and was installed at the beginning of June. She reports that when she is in her master bedroom the dizziness and heart palpitations are worse. She has been sleeping in her guest bedroom and on the cough and her symptoms have subsided.   She would like a letter to [employer] that would allow her to not pay a fee to have the old meter placed back.   She denies working in her yard or using any type of pesticides. Her house was built in 2007  She has gas heat, but has not been using the heat. All of her other appliances are electric   No symptoms currently.   Review of Systems  HENT: Negative.   Respiratory: Negative.   Cardiovascular: Positive for palpitations. Negative for chest pain and leg swelling.  Genitourinary: Negative.   Musculoskeletal: Negative.   Neurological: Positive for dizziness. Negative for tremors, seizures, syncope, facial asymmetry, speech difficulty, weakness, light-headedness, numbness and headaches.  Hematological: Negative.   Psychiatric/Behavioral: Negative.   All other systems reviewed and are negative.  Past Medical History:  Diagnosis Date  . Allergy   . Anxiety   . HIV (human immunodeficiency virus infection) (HCC)   . Hypertension   . Insomnia   . Migraine     Social History   Social History  . Marital status: Single    Spouse name: N/A  . Number of children: 1  .  Years of education: N/A   Occupational History  . 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 Smoking status: Never Smoker  . Smokeless tobacco: Never Used  . Alcohol use 0.0 oz/week     Comment: socially  . Drug use: No  . Sexual activity: Yes    Partners: Male    Birth control/ protection: Abstinence, Surgical   Other Topics Concern  . 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     Past Surgical History:  Procedure Laterality Date  . ABDOMINAL HYSTERECTOMY Right 2004   partial    Family History  Problem Relation Age of Onset  . Cerebral aneurysm Father 34  . Aortic aneurysm Father   . Diabetes Unknown   . Hypertension Unknown   . Brain cancer Unknown   . Breast cancer Neg Hx   . Colon cancer Neg Hx   . Esophageal cancer Neg Hx   . Stomach cancer Neg Hx   . Rectal cancer Neg Hx     Allergies  Allergen Reactions  . Iodinated Diagnostic Agents Other (See Comments)    Pain in my eyes .   Marland Kitchen Sulfonamide Derivatives Hives    Current Outpatient Prescriptions on  File Prior to Visit  Medication Sig Dispense Refill  . bictegravir-emtricitabine-tenofovir AF (BIKTARVY) 50-200-25 MG TABS tablet Take 1 tablet by mouth daily. 90 tablet 3  . Cholecalciferol (VITAMIN D PO) Take by mouth daily.    . clonazePAM (KLONOPIN) 0.5 MG tablet Take 1 tablet (0.5 mg total) by mouth daily as needed for anxiety. 30 tablet 0  . cyclobenzaprine (FLEXERIL) 10 MG tablet Take 1 tablet (10 mg total) by mouth 3 (three) times daily as needed for muscle spasms. 30 tablet 0  . hydrochlorothiazide (HYDRODIURIL) 25 MG tablet TAKE ONE TABLET BY MOUTH ONCE DAILY 90 tablet 1  . meclizine (ANTIVERT) 25 MG tablet Take 1 tablet (25 mg total) by mouth 3 (three) times daily as needed for dizziness. 30 tablet 0  . Multiple  Vitamins-Minerals (OCUVITE PO) Take 1 tablet by mouth daily.    . ondansetron (ZOFRAN ODT) 4 MG disintegrating tablet Take 1 tablet (4 mg total) by mouth every 8 (eight) hours as needed for nausea or vomiting. 20 tablet 0  . SUMAtriptan (IMITREX) 100 MG tablet Take 1 tablet (100 mg total) by mouth once as needed. 10 tablet 3   No current facility-administered medications on file prior to visit.     BP 130/82   Temp 98.2 F (36.8 C) (Oral)   Wt 197 lb (89.4 kg)   BMI 30.40 kg/m       Objective:   Physical Exam  Constitutional: She is oriented to person, place, and time. She appears well-developed and well-nourished. No distress.  Cardiovascular: Normal rate, regular rhythm, normal heart sounds and intact distal pulses.  Exam reveals no gallop and no friction rub.   No murmur heard. Pulmonary/Chest: Effort normal and breath sounds normal. No respiratory distress. She has no wheezes. She has no rales. She exhibits no tenderness.  Neurological: She is alert and oriented to person, place, and time.  Skin: Skin is warm and dry. No rash noted. She is not diaphoretic. No erythema. No pallor.  Psychiatric: She has a normal mood and affect. Her behavior is normal. Judgment and thought content normal.  Nursing note and vitals reviewed.     Assessment & Plan:  Although it is possible that her symptoms are caused by electromagnetic fields being emitted  From the smart meter, this is not plausible. She is going to be going on vacation, and out of town next week. I am going to do some research on the topic. I also advised getting a home carbon monoxide detector.We spoke about a Holter monitor but she will wait to do this.   Shirline Frees, NP

## 2017-06-19 ENCOUNTER — Ambulatory Visit: Payer: BLUE CROSS/BLUE SHIELD | Admitting: Infectious Diseases

## 2017-06-23 MED FILL — BIKTARVY 50-200-25 MG TABS: 50-200-25 | 30 days supply | Qty: 30 | Fill #4

## 2017-06-26 ENCOUNTER — Other Ambulatory Visit: Payer: BLUE CROSS/BLUE SHIELD

## 2017-07-05 ENCOUNTER — Other Ambulatory Visit: Payer: Self-pay | Admitting: Pharmacist

## 2017-07-05 ENCOUNTER — Ambulatory Visit (INDEPENDENT_AMBULATORY_CARE_PROVIDER_SITE_OTHER): Payer: BLUE CROSS/BLUE SHIELD | Admitting: Infectious Diseases

## 2017-07-05 ENCOUNTER — Encounter: Payer: Self-pay | Admitting: Infectious Diseases

## 2017-07-05 VITALS — BP 129/90 | HR 73 | Temp 98.4°F | Ht 67.5 in | Wt 199.0 lb

## 2017-07-05 DIAGNOSIS — Z79899 Other long term (current) drug therapy: Secondary | ICD-10-CM

## 2017-07-05 DIAGNOSIS — B20 Human immunodeficiency virus [HIV] disease: Secondary | ICD-10-CM

## 2017-07-05 DIAGNOSIS — G43709 Chronic migraine without aura, not intractable, without status migrainosus: Secondary | ICD-10-CM

## 2017-07-05 DIAGNOSIS — I10 Essential (primary) hypertension: Secondary | ICD-10-CM

## 2017-07-05 DIAGNOSIS — G4700 Insomnia, unspecified: Secondary | ICD-10-CM | POA: Diagnosis not present

## 2017-07-05 DIAGNOSIS — Z113 Encounter for screening for infections with a predominantly sexual mode of transmission: Secondary | ICD-10-CM | POA: Diagnosis not present

## 2017-07-05 MED ORDER — TEMAZEPAM 15 MG PO CAPS: 15.0000 mg | ORAL_CAPSULE | Freq: Every evening | ORAL | 0 refills | Status: DC | PRN

## 2017-07-05 NOTE — Assessment & Plan Note (Addendum)
Doing very well Has gotten flu Offered/refused condoms.  Gets colon tomorrow Last PAP was Aug 15, 2017.  Normal mammo 01-2017 Und in July after med change.  rtc in 9 months.

## 2017-07-05 NOTE — Assessment & Plan Note (Addendum)
Wakes up with headache every am.  Last dose of imitrex several months ago. Takes excedrin migraine more often.  Has not seen neuro for years. Offered repeat visit.  Will test house for CO2 first.

## 2017-07-05 NOTE — Progress Notes (Signed)
   Subjective:    Patient ID: [name], female    DOB: [birth year], 51 y.o.   MRN: [id number]  HPI 51 yo F with hx of HIV+, was previously on truvada/nelfinavir then switched to complera for simplicity. 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 In February 2013 was changed to tivicay-descovy--> biktarvy. Is getting colon tomorrow.  Was having issues with vertigo. 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 Told fluid and swelling in eye should go down on its own.  Has gas heat. Has had sx over the summer. Gas water tank (in garage), electric stove.   HIV 1 RNA Quant (copies/mL)  Date Value  04/03/2017 <20 DETECTED (A)  12/12/2016 <20 NOT DETECTED  03/30/2015 <20   CD4 T Cell Abs (/uL)  Date Value  12/12/2016 690  03/30/2015 950  10/27/2014 970    Review of Systems  Constitutional: Negative for appetite change and unexpected weight change.  Eyes: Positive for visual disturbance.  Respiratory: Negative for cough and shortness of breath.   Gastrointestinal: Negative for constipation and diarrhea.  Genitourinary: Negative for difficulty urinating.  Neurological: Positive for dizziness.  Psychiatric/Behavioral: Positive for sleep disturbance.  wt creeping up. Having hot flashes. Prev had estrogen patch (felt it made her wt worse), made flashes slightly better.  Sleeps 2h, can't stay asleep (has taken ambien, benadryl- helps) has not tried restoril, trazadone.  Please see HPI. All other systems reviewed and negative.    Objective:   Physical Exam  Constitutional: She appears well-developed and well-nourished.  HENT:  Mouth/Throat: No oropharyngeal exudate.  Eyes: Pupils are equal, round, and reactive to light. EOM are normal.  Neck: Neck supple.  Cardiovascular: Normal rate, regular rhythm and normal heart sounds.   Pulmonary/Chest: Effort normal and breath sounds normal.  Abdominal:  Soft. Bowel sounds are normal. There is no tenderness. There is no rebound.  Musculoskeletal: She exhibits no edema.  Lymphadenopathy:    She has no cervical adenopathy.  Psychiatric: She has a normal mood and affect.       Assessment & Plan:

## 2017-07-05 NOTE — Assessment & Plan Note (Signed)
Well controlled.  Continues on HCTZ.  BP has been good at home.

## 2017-07-05 NOTE — Assessment & Plan Note (Signed)
Will trial restoril.

## 2017-07-06 ENCOUNTER — Encounter: Payer: Self-pay | Admitting: Internal Medicine

## 2017-07-06 ENCOUNTER — Ambulatory Visit (AMBULATORY_SURGERY_CENTER): Payer: BLUE CROSS/BLUE SHIELD | Admitting: Internal Medicine

## 2017-07-06 VITALS — BP 142/90 | HR 58 | Temp 97.3°F | Resp 12 | Ht 67.0 in | Wt 199.0 lb

## 2017-07-06 DIAGNOSIS — Z1212 Encounter for screening for malignant neoplasm of rectum: Secondary | ICD-10-CM

## 2017-07-06 DIAGNOSIS — Z1211 Encounter for screening for malignant neoplasm of colon: Secondary | ICD-10-CM | POA: Diagnosis present

## 2017-07-06 LAB — T-HELPER CELL (CD4) - (RCID CLINIC ONLY)
CD4 T CELL ABS: 940 /uL (ref 400–2700)
CD4 T CELL HELPER: 48 % (ref 33–55)

## 2017-07-06 MED ORDER — SODIUM CHLORIDE 0.9 % IV SOLN: 500.0000 mL | INTRAVENOUS | Status: DC

## 2017-07-06 NOTE — Patient Instructions (Addendum)
   No polyps or cancer seen.  Next routine colonoscopy or other screening test in 10 years - 2028  I appreciate the opportunity to care for you. Iva Booparl E. Gessner, MD, FACG YOU HAD AN ENDOSCOPIC PROCEDURE TODAY AT THE [facility]:   Refer to the procedure report that was given to you for any specific questions about what was found during the examination.  If the procedure report does not answer your questions, please call your gastroenterologist to clarify.  If you requested that your care partner not be given the details of your procedure findings, then the procedure report has been included in a sealed envelope for you to review at your convenience later.  YOU SHOULD EXPECT: Some feelings of bloating in the abdomen. Passage of more gas than usual.  Walking can help get rid of the air that was put into your GI tract during the procedure and reduce the bloating. If you had a lower endoscopy (such as a colonoscopy or flexible sigmoidoscopy) you may notice spotting of blood in your stool or on the toilet paper. If you underwent a bowel prep for your procedure, you may not have a normal bowel movement for a few days.  Please Note:  You might notice some irritation and congestion in your nose or some drainage.  This is from the oxygen used during your procedure.  There is no need for concern and it should clear up in a day or so.  SYMPTOMS TO REPORT IMMEDIATELY:   Following lower endoscopy (colonoscopy or flexible sigmoidoscopy):  Excessive amounts of blood in the stool  Significant tenderness or worsening of abdominal pains  Swelling of the abdomen that is new, acute  Fever of 100F or higher  For urgent or emergent issues, a gastroenterologist can be reached at any hour by calling [phone].   DIET:  We do recommend a small meal at first, but then you may proceed to your regular diet.  Drink plenty of fluids but you should avoid alcoholic beverages for 24  hours.  MEDICATIONS: Continue present medications.  ACTIVITY:  You should plan to take it easy for the rest of today and you should NOT DRIVE or use heavy machinery until tomorrow (because of the sedation medicines used during the test).    FOLLOW UP: Our staff will call the number listed on your records the next business day following your procedure to check on you and address any questions or concerns that you may have regarding the information given to you following your procedure. If we do not reach you, we will leave a message.  However, if you are feeling well and you are not experiencing any problems, there is no need to return our call.  We will assume that you have returned to your regular daily activities without incident.  If any biopsies were taken you will be contacted by phone or by letter within the next 1-3 weeks.  Please call us at [phone] if you have not heard about the biopsies in 3 weeks.   Thank you for allowing us to provide for your healthcare needs today.  SIGNATURES/CONFIDENTIALITY: You and/or your care partner have signed paperwork which will be entered into your electronic medical record.  These signatures attest to the fact that that the information above on your After Visit Summary has been reviewed and is understood.  Full responsibility of the confidentiality of this discharge information lies with you and/or your care-partner.

## 2017-07-06 NOTE — Op Note (Signed)
[facility] Patient Name: [name] Procedure Date: 07/06/2017 1:20 PM MRN: [id number] Endoscopist: Iva Booparl E Gessner , MD Age: 51 Referring MD:  Date of Birth: 07/24/1966 Gender: Female Account #: [id number] Procedure:                Colonoscopy Indications:              Screening for colorectal malignant neoplasm, This                            is the patient's first colonoscopy Medicines:                Propofol per Anesthesia, Monitored Anesthesia Care Procedure:                Pre-Anesthesia Assessment:                           - Prior to the procedure, a History and Physical                            was performed, and patient medications and                            allergies were reviewed. The patient's tolerance of                            previous anesthesia was also reviewed. The risks                            and benefits of the procedure and the sedation                            options and risks were discussed with the patient.                            All questions were answered, and informed consent                            was obtained. Prior Anticoagulants: The patient has                            taken no previous anticoagulant or antiplatelet                            agents. ASA Grade Assessment: II - A patient with                            mild systemic disease. After reviewing the risks                            and benefits, the patient was deemed in                            satisfactory condition to undergo the procedure.  After obtaining informed consent, the colonoscope                            was passed under direct vision. Throughout the                            procedure, the patient's blood pressure, pulse, and                            oxygen saturations were monitored continuously. The                            Colonoscope was introduced through the anus and   advanced to the the cecum, identified by                            appendiceal orifice and ileocecal valve. The                            colonoscopy was performed without difficulty. The                            patient tolerated the procedure well. The quality                            of the bowel preparation was excellent. The                            ileocecal valve, appendiceal orifice, and rectum                            were photographed. The bowel preparation used was                            Miralax. Scope In: 1:41:00 PM Scope Out: 1:52:42 PM Scope Withdrawal Time: 0 hours 8 minutes 40 seconds  Total Procedure Duration: 0 hours 11 minutes 42 seconds  Findings:                 The perianal and digital rectal examinations were                            normal.                           There was a medium-sized lipoma, in the transverse                            colon.                           The exam was otherwise without abnormality on                            direct and retroflexion views. Complications:            No immediate complications.  Estimated Blood Loss:     Estimated blood loss: none. Impression:               - Medium-sized lipoma in the transverse colon.                           - The examination was otherwise normal on direct                            and retroflexion views.                           - No specimens collected. Recommendation:           - Patient has a contact number available for                            emergencies. The signs and symptoms of potential                            delayed complications were discussed with the                            patient. Return to normal activities tomorrow.                            Written discharge instructions were provided to the                            patient.                           - Resume previous diet.                           - Continue present medications.                            - Repeat colonoscopy in 10 years for screening                            purposes. Iva Boop, MD 07/06/2017 1:58:58 PM This report has been signed electronically.

## 2017-07-06 NOTE — Progress Notes (Signed)
Pt's states no medical or surgical changes since previsit or office visit. 

## 2017-07-06 NOTE — Progress Notes (Signed)
Report to PACU, RN, vss, BBS= Clear.  

## 2017-07-07 ENCOUNTER — Telehealth: Payer: Self-pay

## 2017-07-07 LAB — HIV-1 RNA QUANT-NO REFLEX-BLD
HIV 1 RNA QUANT: NOT DETECTED {copies}/mL
HIV-1 RNA Quant, Log: <1.3 Log copies/mL

## 2017-07-07 NOTE — Telephone Encounter (Signed)
  Follow up Call-  Call back number 07/06/2017  Post procedure Call Back phone  # [phone]  Permission to leave phone message Yes  Some recent data might be hidden     Patient questions:  Do you have a fever, pain , or abdominal swelling? No. Pain Score  0 *  Have you tolerated food without any problems? Yes.    Have you been able to return to your normal activities? Yes.    Do you have any questions about your discharge instructions: Diet   No. Medications  No. Follow up visit  No.  Do you have questions or concerns about your Care? No.  Actions: * If pain score is 4 or above: No action needed, pain <4.

## 2017-07-14 ENCOUNTER — Encounter: Payer: BLUE CROSS/BLUE SHIELD | Admitting: Adult Health

## 2017-07-21 DIAGNOSIS — H35712 Central serous chorioretinopathy, left eye: Secondary | ICD-10-CM | POA: Diagnosis not present

## 2017-07-21 MED FILL — BIKTARVY 50-200-25 MG TABS: 50-200-25 | 30 days supply | Qty: 30 | Fill #5

## 2017-08-10 ENCOUNTER — Ambulatory Visit (INDEPENDENT_AMBULATORY_CARE_PROVIDER_SITE_OTHER): Payer: BLUE CROSS/BLUE SHIELD | Admitting: Adult Health

## 2017-08-10 ENCOUNTER — Encounter: Payer: Self-pay | Admitting: Adult Health

## 2017-08-10 VITALS — BP 110/92 | HR 62 | Temp 97.7°F | Ht 67.0 in | Wt 199.0 lb

## 2017-08-10 DIAGNOSIS — F411 Generalized anxiety disorder: Secondary | ICD-10-CM

## 2017-08-10 DIAGNOSIS — M25562 Pain in left knee: Secondary | ICD-10-CM

## 2017-08-10 DIAGNOSIS — I10 Essential (primary) hypertension: Secondary | ICD-10-CM | POA: Diagnosis not present

## 2017-08-10 DIAGNOSIS — Z0001 Encounter for general adult medical examination with abnormal findings: Secondary | ICD-10-CM

## 2017-08-10 DIAGNOSIS — Z0000 Encounter for general adult medical examination without abnormal findings: Secondary | ICD-10-CM

## 2017-08-10 LAB — BASIC METABOLIC PANEL
BUN: 10 mg/dL (ref 6–23)
CHLORIDE: 103 meq/L (ref 96–112)
CO2: 32 mEq/L (ref 19–32)
Calcium: 9 mg/dL (ref 8.4–10.5)
Creatinine, Ser: 1.05 mg/dL (ref 0.40–1.20)
GFR: 71.02 mL/min (ref >60.00)
Glucose, Bld: 83 mg/dL (ref 70–99)
POTASSIUM: 3.7 meq/L (ref 3.5–5.1)
SODIUM: 141 meq/L (ref 135–145)

## 2017-08-10 LAB — CBC WITH DIFFERENTIAL/PLATELET
BASOS PCT: 1.1 % (ref 0.0–3.0)
Basophils Absolute: 0 10*3/uL (ref 0.0–0.1)
EOS PCT: 0.9 % (ref 0.0–5.0)
Eosinophils Absolute: 0 10*3/uL (ref 0.0–0.7)
HEMATOCRIT: 39.5 % (ref 36.0–46.0)
HEMOGLOBIN: 12.9 g/dL (ref 12.0–15.0)
LYMPHS PCT: 45.2 % (ref 12.0–46.0)
Lymphs Abs: 1.8 10*3/uL (ref 0.7–4.0)
MCHC: 32.5 g/dL (ref 30.0–36.0)
MCV: 87.5 fl (ref 78.0–100.0)
MONO ABS: 0.3 10*3/uL (ref 0.1–1.0)
Monocytes Relative: 8.5 % (ref 3.0–12.0)
Neutro Abs: 1.8 10*3/uL (ref 1.4–7.7)
Neutrophils Relative %: 44.3 % (ref 43.0–77.0)
Platelets: 300 10*3/uL (ref 150.0–400.0)
RBC: 4.52 Mil/uL (ref 3.87–5.11)
RDW: 14.3 % (ref 11.5–15.5)
WBC: 4.1 10*3/uL (ref 4.0–10.5)

## 2017-08-10 LAB — LIPID PANEL
Cholesterol: 173 mg/dL (ref 0–200)
HDL: 48.1 mg/dL (ref >39.00)
LDL CALC: 112 mg/dL — AB (ref 0–99)
NonHDL: 124.72
Total CHOL/HDL Ratio: 4
Triglycerides: 65 mg/dL (ref 0.0–149.0)
VLDL: 13 mg/dL (ref 0.0–40.0)

## 2017-08-10 LAB — HEPATIC FUNCTION PANEL
ALT: 6 U/L (ref 0–35)
AST: 13 U/L (ref 0–37)
Albumin: 4.1 g/dL (ref 3.5–5.2)
Alkaline Phosphatase: 66 U/L (ref 39–117)
BILIRUBIN DIRECT: 0.1 mg/dL (ref 0.0–0.3)
BILIRUBIN TOTAL: 0.5 mg/dL (ref 0.2–1.2)
TOTAL PROTEIN: 6.7 g/dL (ref 6.0–8.3)

## 2017-08-10 LAB — TSH: TSH: 0.95 u[IU]/mL (ref 0.35–4.50)

## 2017-08-10 LAB — HEMOGLOBIN A1C: Hgb A1c MFr Bld: 5.2 % (ref 4.6–6.5)

## 2017-08-10 MED ORDER — HYDROCHLOROTHIAZIDE 25 MG PO TABS: 25.0000 mg | ORAL_TABLET | Freq: Every day | ORAL | 3 refills | Status: DC

## 2017-08-10 MED ORDER — CLONAZEPAM 0.5 MG PO TABS: 0.5000 mg | ORAL_TABLET | Freq: Every day | ORAL | 0 refills | Status: DC | PRN

## 2017-08-10 NOTE — Progress Notes (Signed)
Subjective:    Patient ID: [name], female    DOB: [birth year], 51 y.o.   MRN: [id number]  HPI  Patient presents for yearly preventative medicine examination. She is a pleasant 51 year old female who  has a past medical history of Allergy, Anxiety, HIV (human immunodeficiency virus infection) (HCC), Hypertension, Insomnia, and Migraine.   She takes HCTZ 25 for hypertension   She takes Biktarvy for HIV. She is followed by ID clinic  Lab Results  Component Value Date   CD4TCELL 48 07/05/2017   CD4TABS 940 07/05/2017   She also takes Klonopin PRN for anxiety, last refill was in June 2018   All immunizations and health maintenance protocols were reviewed with the patient and needed orders were placed.  Appropriate screening laboratory values were ordered for the patient including screening of hyperlipidemia, renal function and hepatic function. If indicated by BPH, a PSA was ordered.  Medication reconciliation,  past medical history, social history, problem list and allergies were reviewed in detail with the patient  Goals were established with regard to weight loss, exercise, and  diet in compliance with medications. She does not eat healthy and does not exercise on a regular basis  Wt Readings from Last 3 Encounters:  08/10/17 199 lb (90.3 kg)  07/06/17 199 lb (90.3 kg)  07/05/17 199 lb (90.3 kg)    End of life planning was discussed.  She has been seen by GYN and is up to date on her pap and mammogram. She had a colonoscopy done last month.   Her only acute complaint is that of left knee pain after a slip and fall in October. She reports that she was running up the stairs at her sons home and falling on cement steps. She continues to have some pain in her left knee but has been improving. Pain is more apparent after standing for long periods of time.   During a visit in October she was complaining of dizziness and palpitations. She thought that this was due to her a "  smart meter" that was installed outside of her bedroom window. During her last visit she was going on vacation so we decided to see if her symptoms resolved once she went on vacation. She reports that while on vacation she did not experience any symptoms but soon after returning she started to experience headaches again. She is going to fax me a letter to fill out from [employer] in order to have the meter location changed    Review of Systems  Constitutional: Negative.   HENT: Negative.   Eyes: Negative.   Respiratory: Negative.   Cardiovascular: Negative.   Gastrointestinal: Negative.   Endocrine: Negative.   Genitourinary: Negative.   Musculoskeletal: Negative.        Left knee pain    Skin: Negative.   Allergic/Immunologic: Negative.   Neurological: Positive for headaches.  Hematological: Negative.   Psychiatric/Behavioral: Negative.    Past Medical History:  Diagnosis Date  . Allergy   . Anxiety   . HIV (human immunodeficiency virus infection) (HCC)   . Hypertension   . Insomnia   . Migraine     Social History   Socioeconomic History  . Marital status: Single    Spouse name: Not on file  . Number of children: 1  . Years of education: Not on file  . Highest education level: Not on file  Social Needs  . Financial resource strain: Not on file  . Food insecurity -  worry: Not on file  . Food insecurity - inability: Not on file  . Transportation needs - medical: Not on file  . Transportation needs - non-medical: Not on file  Occupational History  . Occupation: Geophysical data processor: [employer]  Tobacco Use  . Smoking status: Never Smoker  . Smokeless tobacco: Never Used  Substance and Sexual Activity  . Alcohol use: Yes    Alcohol/week: 0.0 oz    Comment: socially  . Drug use: No  . Sexual activity: Yes    Partners: Male    Birth control/protection: Abstinence, Surgical  Other Topics Concern  . 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     Past Surgical History:  Procedure Laterality Date  . ABDOMINAL HYSTERECTOMY Right 2004   partial    Family History  Problem Relation Age of Onset  . Cerebral aneurysm Father 42  . Aortic aneurysm Father   . Diabetes Unknown   . Hypertension Unknown   . Brain cancer Unknown   . Breast cancer Neg Hx   . Colon cancer Neg Hx   . Esophageal cancer Neg Hx   . Stomach cancer Neg Hx   . Rectal cancer Neg Hx     Allergies  Allergen Reactions  . Iodinated Diagnostic Agents Other (See Comments)    Pain in my eyes .   Marland Kitchen Sulfonamide Derivatives Hives    Current Outpatient Medications on File Prior to Visit  Medication Sig Dispense Refill  . bictegravir-emtricitabine-tenofovir AF (BIKTARVY) 50-200-25 MG TABS tablet Take 1 tablet by mouth daily. 90 tablet 3  . Cholecalciferol (VITAMIN D PO) Take by mouth daily.    . meclizine (ANTIVERT) 25 MG tablet Take 1 tablet (25 mg total) by mouth 3 (three) times daily as needed for dizziness. 30 tablet 0  . Multiple Vitamins-Minerals (OCUVITE PO) Take 1 tablet by mouth daily.    . ondansetron (ZOFRAN ODT) 4 MG disintegrating tablet Take 1 tablet (4 mg total) by mouth every 8 (eight) hours as needed for nausea or vomiting. 20 tablet 0   Current Facility-Administered Medications on File Prior to Visit  Medication Dose Route Frequency Provider Last Rate Last Dose  . 0.9 %  sodium chloride infusion  500 mL Intravenous Continuous Iva Boop, MD        BP (!) 110/92 (BP Location: Left Arm, Patient Position: Sitting, Cuff Size: Normal)   Pulse 62   Temp 97.7 F (36.5 C) (Oral)   Ht 5\' 7"  (1.702 m)   Wt 199 lb (90.3 kg)   SpO2 99%   BMI 31.17 kg/m       Objective:   Physical Exam  Constitutional: She is oriented to person, place, and time. She appears well-developed and well-nourished. No  distress.  Obese   HENT:  Head: Normocephalic and atraumatic.  Right Ear: External ear normal.  Left Ear: External ear normal.  Nose: Nose normal.  Mouth/Throat: Oropharynx is clear and moist. No oropharyngeal exudate.  Eyes: Conjunctivae and EOM are normal. Pupils are equal, round, and reactive to light. Right eye exhibits no discharge. Left eye exhibits no discharge. No scleral icterus.  Neck: Normal range of motion. Neck supple. No JVD present. Carotid bruit is not present. No tracheal deviation present. No thyromegaly present.  Cardiovascular: Normal rate, regular rhythm, normal heart sounds and intact distal pulses. Exam reveals no gallop and no friction rub.  No murmur heard. Pulmonary/Chest: Effort normal and breath sounds normal. No stridor. No respiratory distress. She has no wheezes. She has no rales. She exhibits no tenderness.  Abdominal: Soft. Bowel sounds are normal. She exhibits no distension and no mass. There is no tenderness. There is no rebound and no guarding.  Genitourinary:  Genitourinary Comments: Will be done by GYN   Musculoskeletal: Normal range of motion. She exhibits no edema, tenderness or deformity.       Left knee: Normal. She exhibits normal range of motion, no swelling, no deformity, no erythema, normal alignment, no bony tenderness, normal meniscus and no MCL laxity. No tenderness found.  No bruising noted on left knee   Lymphadenopathy:    She has no cervical adenopathy.  Neurological: She is alert and oriented to person, place, and time. She has normal reflexes. She displays normal reflexes. No cranial nerve deficit. She exhibits normal muscle tone. Coordination normal.  Skin: Skin is warm and dry. No rash noted. She is not diaphoretic. No erythema. No pallor.  Psychiatric: She has a normal mood and affect. Her behavior is normal. Judgment and thought content normal.  Nursing note and vitals reviewed.     Assessment & Plan:  1. Routine general medical  examination at a health care facility - Encouraged diet and exercise to lose weight  - Follow up in one year or sooner if needed - Basic metabolic panel - CBC with Differential/Platelet - Hepatic function panel - Hemoglobin A1c - Lipid panel - TSH  2. Anxiety state  - clonazePAM (KLONOPIN) 0.5 MG tablet; Take 1 tablet (0.5 mg total) by mouth daily as needed for anxiety.  Dispense: 30 tablet; Refill: 0  3. Essential hypertension - well controlled. No change in medications - Basic metabolic panel - CBC with Differential/Platelet - Hepatic function panel - Hemoglobin A1c - Lipid panel - TSH - hydrochlorothiazide (HYDRODIURIL) 25 MG tablet; Take 1 tablet (25 mg total) by mouth daily.  Dispense: 90 tablet; Refill: 3  4. Acute pain of left knee - Normal stable knee  Follow up as needed   Shirline Freesory Meryem Haertel, NP

## 2017-08-10 NOTE — Patient Instructions (Signed)
It was great seeing you today!   I will call you about your blood work   If you need anything, please let me know   [phone] our fax number

## 2017-08-28 MED FILL — BIKTARVY 50-200-25 MG TABS: 50-200-25 | 30 days supply | Qty: 30 | Fill #6

## 2017-09-27 MED FILL — BIKTARVY 50-200-25 MG TABS: 50-200-25 | 30 days supply | Qty: 30 | Fill #7

## 2017-09-29 DIAGNOSIS — Z6830 Body mass index (BMI) 30.0-30.9, adult: Secondary | ICD-10-CM | POA: Diagnosis not present

## 2017-09-29 DIAGNOSIS — Z01419 Encounter for gynecological examination (general) (routine) without abnormal findings: Secondary | ICD-10-CM | POA: Diagnosis not present

## 2017-10-23 DIAGNOSIS — H35712 Central serous chorioretinopathy, left eye: Secondary | ICD-10-CM | POA: Diagnosis not present

## 2017-10-30 MED FILL — BIKTARVY 50-200-25 MG TABS: 50-200-25 | 30 days supply | Qty: 30 | Fill #8

## 2017-11-23 MED FILL — BIKTARVY 50-200-25 MG TABS: 50-200-25 | 30 days supply | Qty: 30 | Fill #9

## 2017-12-18 ENCOUNTER — Other Ambulatory Visit: Payer: Self-pay | Admitting: Infectious Diseases

## 2017-12-18 DIAGNOSIS — Z1231 Encounter for screening mammogram for malignant neoplasm of breast: Secondary | ICD-10-CM

## 2017-12-21 DIAGNOSIS — H35712 Central serous chorioretinopathy, left eye: Secondary | ICD-10-CM | POA: Diagnosis not present

## 2017-12-28 MED FILL — BIKTARVY 50-200-25 MG TABS: 50-200-25 | 30 days supply | Qty: 30 | Fill #10

## 2018-01-22 ENCOUNTER — Encounter: Payer: Self-pay | Admitting: Infectious Diseases

## 2018-01-22 ENCOUNTER — Ambulatory Visit
Admission: RE | Admit: 2018-01-22 | Discharge: 2018-01-22 | Disposition: A | Discharge status: Home / Self Care | Payer: BLUE CROSS/BLUE SHIELD | Source: Ambulatory Visit | Attending: Infectious Diseases | Admitting: Infectious Diseases

## 2018-01-22 ENCOUNTER — Ambulatory Visit (INDEPENDENT_AMBULATORY_CARE_PROVIDER_SITE_OTHER): Payer: BLUE CROSS/BLUE SHIELD | Admitting: Infectious Diseases

## 2018-01-22 VITALS — BP 137/84 | HR 62 | Temp 98.3°F | Ht 67.5 in | Wt 197.0 lb

## 2018-01-22 DIAGNOSIS — H35712 Central serous chorioretinopathy, left eye: Secondary | ICD-10-CM | POA: Diagnosis not present

## 2018-01-22 DIAGNOSIS — Z23 Encounter for immunization: Secondary | ICD-10-CM

## 2018-01-22 DIAGNOSIS — Z1231 Encounter for screening mammogram for malignant neoplasm of breast: Secondary | ICD-10-CM | POA: Diagnosis not present

## 2018-01-22 DIAGNOSIS — Z79899 Other long term (current) drug therapy: Secondary | ICD-10-CM | POA: Diagnosis not present

## 2018-01-22 DIAGNOSIS — Z113 Encounter for screening for infections with a predominantly sexual mode of transmission: Secondary | ICD-10-CM

## 2018-01-22 DIAGNOSIS — I10 Essential (primary) hypertension: Secondary | ICD-10-CM

## 2018-01-22 DIAGNOSIS — B20 Human immunodeficiency virus [HIV] disease: Secondary | ICD-10-CM

## 2018-01-22 NOTE — Progress Notes (Signed)
   Subjective:    Patient ID: [name], female    DOB: 04/28/1966, 52 y.o.   MRN: [id number]  HPI 52yo F with hx of HIV+, was previously on truvada/nelfinavir then switched to complera for simplicity. 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 In February 2013 was changed to tivicay-descovy--> biktarvy. She was having vertigo and headaches. She had removal of appliance by [employer] (smart meter).  Has lasik surgery on L eye. Was prev having swelling, fluid behind retina.  Since both of these things, her vertigo and headaches have resolved.  Takes biktarvy a HS.   Has PAP with Dr Tamela Oddi. 09-29-17 mammo 01-22-18 (pending)  HIV 1 RNA Quant (copies/mL)  Date Value  07/05/2017 <20 NOT DETECTED  04/03/2017 <20 DETECTED (A)  12/12/2016 <20 NOT DETECTED   CD4 T Cell Abs (/uL)  Date Value  07/05/2017 940  12/12/2016 690  03/30/2015 950    Review of Systems  Constitutional: Negative for appetite change and unexpected weight change.  HENT: Positive for congestion and postnasal drip.   Gastrointestinal: Negative for constipation and diarrhea.  Genitourinary: Negative for difficulty urinating.  Skin: Positive for rash.  Please see HPI. All other systems reviewed and negative.      Objective:   Physical Exam  Constitutional: She is oriented to person, place, and time. She appears well-developed and well-nourished.  HENT:  Head: Normocephalic and atraumatic.  Mouth/Throat: No oropharyngeal exudate.  Eyes: Pupils are equal, round, and reactive to light. EOM are normal.  Neck: Normal range of motion. Neck supple.  Cardiovascular: Normal rate, regular rhythm and normal heart sounds.  Pulmonary/Chest: Effort normal and breath sounds normal.  Abdominal: Soft. Bowel sounds are normal. She exhibits no distension. There is no tenderness.  Musculoskeletal: She exhibits no edema.  Neurological: She is alert and  oriented to person, place, and time.  Psychiatric: She has a normal mood and affect.       Assessment & Plan:

## 2018-01-22 NOTE — Addendum Note (Signed)
Addended by: Towanda Octave on: 01/22/2018 10:37 AM   Modules accepted: Orders

## 2018-01-22 NOTE — Assessment & Plan Note (Signed)
Continues on HCTZ Slightly elevated today.

## 2018-01-22 NOTE — Assessment & Plan Note (Signed)
She is doing well Offered/refused condoms.  Offer prevnar. Check labs today rtc in 9 months.

## 2018-01-23 LAB — COMPREHENSIVE METABOLIC PANEL
AG Ratio: 1.3 (calc) (ref 1.0–2.5)
ALBUMIN MSPROF: 3.9 g/dL (ref 3.6–5.1)
ALT: 8 U/L (ref 6–29)
AST: 15 U/L (ref 10–35)
Alkaline phosphatase (APISO): 73 U/L (ref 33–130)
BUN / CREAT RATIO: 13 (calc) (ref 6–22)
BUN: 14 mg/dL (ref 7–25)
CHLORIDE: 105 mmol/L (ref 98–110)
CO2: 31 mmol/L (ref 20–32)
CREATININE: 1.12 mg/dL — AB (ref 0.50–1.05)
Calcium: 9.3 mg/dL (ref 8.6–10.4)
GLOBULIN: 3 g/dL (ref 1.9–3.7)
GLUCOSE: 90 mg/dL (ref 65–99)
Potassium: 3.6 mmol/L (ref 3.5–5.3)
SODIUM: 142 mmol/L (ref 135–146)
TOTAL PROTEIN: 6.9 g/dL (ref 6.1–8.1)
Total Bilirubin: 0.3 mg/dL (ref 0.2–1.2)

## 2018-01-23 LAB — CBC
HCT: 38.2 % (ref 35.0–45.0)
Hemoglobin: 12.7 g/dL (ref 11.7–15.5)
MCH: 28 pg (ref 27.0–33.0)
MCHC: 33.2 g/dL (ref 32.0–36.0)
MCV: 84.3 fL (ref 80.0–100.0)
MPV: 9.4 fL (ref 7.5–12.5)
PLATELETS: 316 10*3/uL (ref 140–400)
RBC: 4.53 10*6/uL (ref 3.80–5.10)
RDW: 13.4 % (ref 11.0–15.0)
WBC: 4.4 10*3/uL (ref 3.8–10.8)

## 2018-01-23 LAB — RPR: RPR: NONREACTIVE

## 2018-01-24 LAB — HIV-1 RNA QUANT-NO REFLEX-BLD
HIV 1 RNA Quant: <20 copies/mL
HIV-1 RNA Quant, Log: <1.3 Log copies/mL

## 2018-01-25 MED FILL — BIKTARVY 50-200-25 MG TABS: 50-200-25 | 30 days supply | Qty: 30 | Fill #11

## 2018-01-31 ENCOUNTER — Ambulatory Visit: Payer: BLUE CROSS/BLUE SHIELD

## 2018-02-15 ENCOUNTER — Other Ambulatory Visit: Payer: Self-pay | Admitting: Infectious Diseases

## 2018-02-21 MED FILL — BIKTARVY 50-200-25 MG TABS: 50-200-25 | 30 days supply | Qty: 30 | Fill #0

## 2018-02-28 ENCOUNTER — Other Ambulatory Visit: Payer: Self-pay | Admitting: Adult Health

## 2018-02-28 DIAGNOSIS — F411 Generalized anxiety disorder: Secondary | ICD-10-CM

## 2018-02-28 NOTE — Telephone Encounter (Signed)
Copied from CRM [id number]. Topic: Quick Communication - Rx Refill/Question >> Feb 28, 2018 11:58 AM Gerrianne ScalePayne, Katherine L wrote: Medication: clonazePAM (KLONOPIN) 0.5 MG tablet  Has the patient contacted their pharmacy? Yes.   (Agent: If no, request that the patient contact the pharmacy for the refill.) (Agent: If yes, when and what did the pharmacy advise?) to call her provider no more refills  Preferred Pharmacy (with phone number or street name): [id number] [place] - [address] [phone] (Phone) [phone] (Fax)      Agent: Please be advised that RX refills may take up to 3 business days. We ask that you follow-up with your pharmacy.

## 2018-03-01 NOTE — Telephone Encounter (Signed)
Clonazepam refill Last Refill:08/10/17 #30 Last OV: 08/10/17 PCP: Malachi Proory Nafziger,NP [facility]    [phone]

## 2018-03-07 MED ORDER — CLONAZEPAM 0.5 MG PO TABS: 0.5000 mg | ORAL_TABLET | Freq: Every day | ORAL | 0 refills | Status: DC | PRN

## 2018-03-07 NOTE — Telephone Encounter (Signed)
Rx done. 

## 2018-03-07 NOTE — Telephone Encounter (Signed)
Call in #30 with no rf  

## 2018-03-20 ENCOUNTER — Telehealth: Payer: Self-pay | Admitting: Adult Health

## 2018-03-20 DIAGNOSIS — F411 Generalized anxiety disorder: Secondary | ICD-10-CM

## 2018-03-20 NOTE — Telephone Encounter (Signed)
Copied from CRM [id number]. Topic: Quick Communication - See Telephone Encounter >> Mar 20, 2018  4:44 PM Arlyss Gandyichardson, Orson Rho N, NT wrote: CRM for notification. See Telephone encounter for: 03/20/18. Pt would like the rx for clonazePAM (KLONOPIN) 0.5 MG tablet to [id number] [place] - [address] [phone] (Phone) [phone] (Fax) instead of [facility].

## 2018-03-21 MED ORDER — CLONAZEPAM 0.5 MG PO TABS: 0.5000 mg | ORAL_TABLET | Freq: Every day | ORAL | 0 refills | Status: DC | PRN

## 2018-03-21 NOTE — Telephone Encounter (Signed)
[facility]  They did not receive the prescription from 03/07/18.  [facility].

## 2018-03-21 NOTE — Telephone Encounter (Signed)
30 day supply sent in by Dr. Clent RidgesFry.  Ok to send in 90 day supply?  I can cancel prescription at [facility].  Please advise.

## 2018-03-21 NOTE — Telephone Encounter (Signed)
That is fine 

## 2018-03-27 MED FILL — BIKTARVY 50-200-25 MG TABS: 50-200-25 | 30 days supply | Qty: 30 | Fill #1

## 2018-04-23 DIAGNOSIS — H35712 Central serous chorioretinopathy, left eye: Secondary | ICD-10-CM | POA: Diagnosis not present

## 2018-04-30 MED FILL — BIKTARVY 50-200-25 MG TABS: 50-200-25 | 30 days supply | Qty: 30 | Fill #2

## 2018-05-30 MED FILL — BIKTARVY 50-200-25 MG TABS: 50-200-25 | 30 days supply | Qty: 30 | Fill #3

## 2018-06-27 MED FILL — BIKTARVY 50-200-25 MG TABS: 50-200-25 | 30 days supply | Qty: 30 | Fill #4

## 2018-07-23 MED FILL — BIKTARVY 50-200-25 MG TABS: 50-200-25 | 30 days supply | Qty: 30 | Fill #5

## 2018-08-22 MED FILL — BIKTARVY 50-200-25 MG TABS: 50-200-25 | 30 days supply | Qty: 30 | Fill #6

## 2018-10-03 MED FILL — BIKTARVY 50-200-25 MG TABS: 50-200-25 | 30 days supply | Qty: 30 | Fill #7

## 2018-10-15 DIAGNOSIS — Z01419 Encounter for gynecological examination (general) (routine) without abnormal findings: Secondary | ICD-10-CM | POA: Diagnosis not present

## 2018-10-15 DIAGNOSIS — Z6832 Body mass index (BMI) 32.0-32.9, adult: Secondary | ICD-10-CM | POA: Diagnosis not present

## 2018-10-15 DIAGNOSIS — N951 Menopausal and female climacteric states: Secondary | ICD-10-CM | POA: Diagnosis not present

## 2018-10-22 ENCOUNTER — Ambulatory Visit: Payer: BLUE CROSS/BLUE SHIELD | Admitting: Infectious Diseases

## 2018-10-26 ENCOUNTER — Ambulatory Visit: Payer: BLUE CROSS/BLUE SHIELD | Admitting: Infectious Diseases

## 2018-10-29 MED FILL — BIKTARVY 50-200-25 MG TABS: 50-200-25 | 30 days supply | Qty: 30 | Fill #8

## 2018-10-31 ENCOUNTER — Ambulatory Visit (INDEPENDENT_AMBULATORY_CARE_PROVIDER_SITE_OTHER): Payer: BLUE CROSS/BLUE SHIELD | Admitting: Infectious Diseases

## 2018-10-31 ENCOUNTER — Encounter: Payer: Self-pay | Admitting: Infectious Diseases

## 2018-10-31 VITALS — Ht 67.5 in

## 2018-10-31 DIAGNOSIS — B20 Human immunodeficiency virus [HIV] disease: Secondary | ICD-10-CM

## 2018-10-31 DIAGNOSIS — E881 Lipodystrophy, not elsewhere classified: Secondary | ICD-10-CM

## 2018-10-31 DIAGNOSIS — Z113 Encounter for screening for infections with a predominantly sexual mode of transmission: Secondary | ICD-10-CM | POA: Diagnosis not present

## 2018-10-31 DIAGNOSIS — Z79899 Other long term (current) drug therapy: Secondary | ICD-10-CM

## 2018-10-31 DIAGNOSIS — G4700 Insomnia, unspecified: Secondary | ICD-10-CM | POA: Diagnosis not present

## 2018-10-31 DIAGNOSIS — R42 Dizziness and giddiness: Secondary | ICD-10-CM | POA: Diagnosis not present

## 2018-10-31 MED ORDER — ZOLPIDEM TARTRATE ER 6.25 MG PO TBCR: 6.2500 mg | EXTENDED_RELEASE_TABLET | Freq: Every evening | ORAL | 0 refills | Status: DC | PRN

## 2018-10-31 NOTE — Assessment & Plan Note (Addendum)
Quit taking benadryl.  Has been in a more regular routine which has worked better.  Tried melatonin but gave her headaches.  Will trial ambien.

## 2018-10-31 NOTE — Assessment & Plan Note (Signed)
She is going to try orlistat, will space 12h from ART.

## 2018-10-31 NOTE — Assessment & Plan Note (Signed)
vax are up to date Appreciate Dr Tina Griffiths f/u.  Repeat labs today.  Offered/refused condoms.  rtc in 1 year.

## 2018-10-31 NOTE — Progress Notes (Signed)
   Subjective:    Patient ID: [name], female    DOB: 1966/08/14, 53 y.o.   MRN: [id number]  HPI 53yo F with hx of HIV+, was previously ontruvada/nelfinavirthen switched to complera for simplicity. 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 In February 2013 was changed totivicay-descovy--> biktarvy. She was having vertigo and headaches. She had removal of appliance by [employer] (smart meter).  Has lasik surgery on L eye. Her vision has normalized.   No problems with biktarvy. Has gained weight (10-15#). Considering taking orlistat. Has been watching diet, has been exercising.  Has taken senokot and Mg Citrate for constipation.   Was seen by Dr Tina Griffiths for GYN- 10-2018.  Mammo due 01-2020.  Had colon 2018.   Review of Systems  Constitutional: Negative for appetite change and unexpected weight change.  Gastrointestinal: Positive for constipation. Negative for diarrhea.  Genitourinary: Negative for difficulty urinating.  Psychiatric/Behavioral: Negative for sleep disturbance.  Please see HPI. All other systems reviewed and negative.      Objective:   Physical Exam Constitutional:      Appearance: Normal appearance.  HENT:     Mouth/Throat:     Mouth: Mucous membranes are moist.     Pharynx: No oropharyngeal exudate.  Eyes:     Extraocular Movements: Extraocular movements intact.     Pupils: Pupils are equal, round, and reactive to light.  Neck:     Musculoskeletal: Normal range of motion and neck supple.  Cardiovascular:     Rate and Rhythm: Normal rate and regular rhythm.  Pulmonary:     Effort: Pulmonary effort is normal.     Breath sounds: Normal breath sounds.  Abdominal:     General: Bowel sounds are normal. There is no distension.     Palpations: Abdomen is soft.     Tenderness: There is no abdominal tenderness.  Musculoskeletal:     Right lower leg: No edema.     Left lower leg: No edema.    Neurological:     General: No focal deficit present.     Mental Status: She is alert.  Psychiatric:        Mood and Affect: Mood normal.       Assessment & Plan:

## 2018-10-31 NOTE — Assessment & Plan Note (Signed)
Continues to take prn meclizine.  Will have her seen by neuro for complete eval.

## 2018-11-03 LAB — COMPREHENSIVE METABOLIC PANEL
AG Ratio: 1.4 (calc) (ref 1.0–2.5)
ALBUMIN MSPROF: 4.2 g/dL (ref 3.6–5.1)
ALT: 9 U/L (ref 6–29)
AST: 16 U/L (ref 10–35)
Alkaline phosphatase (APISO): 81 U/L (ref 37–153)
BUN / CREAT RATIO: 8 (calc) (ref 6–22)
BUN: 10 mg/dL (ref 7–25)
CALCIUM: 9.5 mg/dL (ref 8.6–10.4)
CHLORIDE: 101 mmol/L (ref 98–110)
CO2: 29 mmol/L (ref 20–32)
CREATININE: 1.2 mg/dL — AB (ref 0.50–1.05)
GLOBULIN: 3.1 g/dL (ref 1.9–3.7)
GLUCOSE: 99 mg/dL (ref 65–99)
POTASSIUM: 4 mmol/L (ref 3.5–5.3)
SODIUM: 140 mmol/L (ref 135–146)
TOTAL PROTEIN: 7.3 g/dL (ref 6.1–8.1)
Total Bilirubin: 0.3 mg/dL (ref 0.2–1.2)

## 2018-11-03 LAB — CBC
HCT: 42.4 % (ref 35.0–45.0)
Hemoglobin: 13.7 g/dL (ref 11.7–15.5)
MCH: 27.2 pg (ref 27.0–33.0)
MCHC: 32.3 g/dL (ref 32.0–36.0)
MCV: 84.1 fL (ref 80.0–100.0)
MPV: 9.7 fL (ref 7.5–12.5)
PLATELETS: 317 10*3/uL (ref 140–400)
RBC: 5.04 10*6/uL (ref 3.80–5.10)
RDW: 13.4 % (ref 11.0–15.0)
WBC: 5.3 10*3/uL (ref 3.8–10.8)

## 2018-11-03 LAB — RPR: RPR Ser Ql: NONREACTIVE

## 2018-11-03 LAB — HIV-1 RNA QUANT-NO REFLEX-BLD
HIV 1 RNA Quant: <20 copies/mL
HIV-1 RNA Quant, Log: <1.3 Log copies/mL

## 2018-11-11 ENCOUNTER — Other Ambulatory Visit: Payer: Self-pay | Admitting: Adult Health

## 2018-11-11 DIAGNOSIS — I10 Essential (primary) hypertension: Secondary | ICD-10-CM

## 2018-11-13 NOTE — Telephone Encounter (Signed)
Denied.  Pt needs annual visit. 

## 2018-11-14 ENCOUNTER — Telehealth: Payer: Self-pay | Admitting: Adult Health

## 2018-11-14 DIAGNOSIS — I10 Essential (primary) hypertension: Secondary | ICD-10-CM

## 2018-11-14 MED ORDER — HYDROCHLOROTHIAZIDE 25 MG PO TABS: 25.0000 mg | ORAL_TABLET | Freq: Every day | ORAL | 0 refills | Status: DC

## 2018-11-14 NOTE — Telephone Encounter (Signed)
Copied from CRM [id number]. Topic: Quick Communication - Rx Refill/Question >> Nov 14, 2018  1:57 PM Crist Infante wrote: Medication: hydrochlorothiazide (HYDRODIURIL) 25 MG tablet  Pt has made appt for Thursday 11/29/2018. Would like to get a 30 day to get her to that appt.  ALSO, pt works at [employer] would like to know if Kandee Keen will approve her to come in on a Friday for her cpe. Any Friday before the 30 days run out. Please advise, thanks

## 2018-11-14 NOTE — Telephone Encounter (Signed)
30 day supply sent to the pharmacy by e-scribe. 

## 2018-11-23 ENCOUNTER — Other Ambulatory Visit: Payer: Self-pay | Admitting: Infectious Diseases

## 2018-11-27 ENCOUNTER — Telehealth: Payer: Self-pay | Admitting: Family Medicine

## 2018-11-27 DIAGNOSIS — I10 Essential (primary) hypertension: Secondary | ICD-10-CM

## 2018-11-27 MED ORDER — HYDROCHLOROTHIAZIDE 25 MG PO TABS: 25.0000 mg | ORAL_TABLET | Freq: Every day | ORAL | 0 refills | Status: DC

## 2018-11-27 MED FILL — BIKTARVY 50-200-25 MG TABS: 50-200-25 | 30 days supply | Qty: 30 | Fill #0

## 2018-11-27 NOTE — Telephone Encounter (Signed)
[name], you last authorized a 30 day supply.  Ok to give 90 day until COVID-19 crisis is over or should pt keep her appt and come in?

## 2018-11-27 NOTE — Telephone Encounter (Signed)
Ok to give 90 days  

## 2018-11-27 NOTE — Addendum Note (Signed)
Addended by: Raj Janus T on: 11/27/2018 10:18 AM   Modules accepted: Orders

## 2018-11-27 NOTE — Telephone Encounter (Signed)
Spoke to the pt and informed her that I am sending her rx to the pharmacy for 90 days.  She will call back before prescriptions runs out to reschedule CPX.  Nothing further needed at this time.

## 2018-11-27 NOTE — Telephone Encounter (Signed)
Copied from CRM [id number]. Topic: Appointment Scheduling - Scheduling Inquiry for Clinic >> Nov 26, 2018 12:26 PM Adaline Nevin wrote: Reason for CRM: Patient would like to know if she should keep appointment for CPE scheduled for 3/26. Patient inquired if she can reschedule and have her medication hydrochlorothiazide (HYDRODIURIL) 25 MG tablet called in until she is able to come into office. She is requesting a call back from CMA to discuss further. Please advise.

## 2018-11-29 ENCOUNTER — Encounter: Payer: BLUE CROSS/BLUE SHIELD | Admitting: Adult Health

## 2018-12-21 ENCOUNTER — Other Ambulatory Visit: Payer: Self-pay | Admitting: Infectious Diseases

## 2018-12-21 DIAGNOSIS — Z1231 Encounter for screening mammogram for malignant neoplasm of breast: Secondary | ICD-10-CM

## 2018-12-28 MED FILL — BIKTARVY 50-200-25 MG TABS: 50-200-25 | 30 days supply | Qty: 30 | Fill #1

## 2019-01-25 MED FILL — BIKTARVY 50-200-25 MG TABS: 50-200-25 | 30 days supply | Qty: 30 | Fill #2

## 2019-02-25 ENCOUNTER — Other Ambulatory Visit: Payer: Self-pay

## 2019-02-25 ENCOUNTER — Ambulatory Visit
Admission: RE | Admit: 2019-02-25 | Discharge: 2019-02-25 | Disposition: A | Discharge status: Home / Self Care | Payer: BLUE CROSS/BLUE SHIELD | Source: Ambulatory Visit | Attending: Infectious Diseases | Admitting: Infectious Diseases

## 2019-02-25 DIAGNOSIS — Z1231 Encounter for screening mammogram for malignant neoplasm of breast: Secondary | ICD-10-CM | POA: Diagnosis not present

## 2019-02-27 MED FILL — BIKTARVY 50-200-25 MG TABS: 50-200-25 | 30 days supply | Qty: 30 | Fill #3

## 2019-03-15 ENCOUNTER — Telehealth: Payer: Self-pay | Admitting: Adult Health

## 2019-03-15 NOTE — Telephone Encounter (Signed)
Pt scheduled for 03/19/2019 @ 8:30 AM with Tommi Rumps.  Nothing further needed.

## 2019-03-15 NOTE — Telephone Encounter (Signed)
Patient called with BP concerns. Her BP has been elevated. Bottom number has been  90-100. Highest for the top number has been 155. This has been going on for about 2 weeks. Patient has been taking BP medication but it has not brought the numbers down. Patient is requsting a in office visit if possible. Please advise. Patient phone number is [phone]

## 2019-03-15 NOTE — Telephone Encounter (Signed)
Left a message for a return call.

## 2019-03-15 NOTE — Telephone Encounter (Signed)
Can do Doxy or office visit next week

## 2019-03-19 ENCOUNTER — Other Ambulatory Visit: Payer: Self-pay

## 2019-03-19 ENCOUNTER — Encounter: Payer: Self-pay | Admitting: Adult Health

## 2019-03-19 ENCOUNTER — Ambulatory Visit (INDEPENDENT_AMBULATORY_CARE_PROVIDER_SITE_OTHER): Payer: BLUE CROSS/BLUE SHIELD | Admitting: Adult Health

## 2019-03-19 DIAGNOSIS — I10 Essential (primary) hypertension: Secondary | ICD-10-CM

## 2019-03-19 NOTE — Progress Notes (Signed)
Virtual Visit via Video Note  I connected with [name] on 03/19/19 at  8:30 AM EDT by a video enabled telemedicine application and verified that I am speaking with the correct person using two identifiers.  Location patient: home Location provider:work or home office Persons participating in the virtual visit: patient, provider  I discussed the limitations of evaluation and management by telemedicine and the availability of in person appointments. The patient expressed understanding and agreed to proceed.   HPI:  53 year old female who is being evaluated today for transient elevations in blood pressure.  She is currently prescribed hydrochlorothiazide and take this on a daily basis.  Blood pressures have been pretty well controlled in the past.  Recently she had a 2-week period where her blood pressures were in the 140s to 150s over low 100s.  She does eat a low-sodium diet but was not drinking a whole lot of water at this time nor exercising.  Has increased her water intake.  Most recently her blood pressures have been in the 120s over 80s.  When her blood pressures were elevated she did have intermittent headaches but does have a history of migraine headaches. Denies blurred vision   BP Readings from Last 3 Encounters:  01/22/18 137/84  08/10/17 (!) 110/92  07/06/17 (!) 142/90    ROS: See pertinent positives and negatives per HPI.  Past Medical History:  Diagnosis Date  . Allergy   . Anxiety   . HIV (human immunodeficiency virus infection) ([place])   . Hypertension   . Insomnia   . Migraine     Past Surgical History:  Procedure Laterality Date  . ABDOMINAL HYSTERECTOMY Right 2004   partial    Family History  Problem Relation Age of Onset  . Cerebral aneurysm Father 5  . Aortic aneurysm Father   . Diabetes Other   . Hypertension Other   . Brain cancer Other   . Breast cancer Neg Hx   . Colon cancer Neg Hx   . Esophageal cancer Neg Hx   . Stomach cancer Neg Hx   .  Rectal cancer Neg Hx       Current Outpatient Medications:  .  BIKTARVY 50-200-25 MG TABS tablet, TAKE 1 TABLET BY MOUTH DAILY., Disp: 90 tablet, Rfl: 2 .  Cholecalciferol (VITAMIN D PO), Take by mouth daily., Disp: , Rfl:  .  clonazePAM (KLONOPIN) 0.5 MG tablet, Take 1 tablet (0.5 mg total) by mouth daily as needed for anxiety., Disp: 90 tablet, Rfl: 0 .  hydrochlorothiazide (HYDRODIURIL) 25 MG tablet, Take 1 tablet (25 mg total) by mouth daily., Disp: 90 tablet, Rfl: 0 .  meclizine (ANTIVERT) 25 MG tablet, Take 1 tablet (25 mg total) by mouth 3 (three) times daily as needed for dizziness., Disp: 30 tablet, Rfl: 0 .  Multiple Vitamins-Minerals (OCUVITE PO), Take 1 tablet by mouth daily., Disp: , Rfl:  .  ondansetron (ZOFRAN ODT) 4 MG disintegrating tablet, Take 1 tablet (4 mg total) by mouth every 8 (eight) hours as needed for nausea or vomiting., Disp: 20 tablet, Rfl: 0 .  zolpidem (AMBIEN CR) 6.25 MG CR tablet, Take 1 tablet (6.25 mg total) by mouth at bedtime as needed for sleep., Disp: 30 tablet, Rfl: 0  Current Facility-Administered Medications:  .  0.9 %  sodium chloride infusion, 500 mL, Intravenous, Continuous, Carlean Purl Ofilia Neas, MD  EXAM:  VITALS per patient if applicable: 361/44  GENERAL: alert, oriented, appears well and in no acute distress  HEENT: atraumatic, conjunttiva  clear, no obvious abnormalities on inspection of external nose and ears  NECK: normal movements of the head and neck  LUNGS: on inspection no signs of respiratory distress, breathing rate appears normal, no obvious gross SOB, gasping or wheezing  CV: no obvious cyanosis  MS: moves all visible extremities without noticeable abnormality  PSYCH/NEURO: pleasant and cooperative, no obvious depression or anxiety, speech and thought processing grossly intact  ASSESSMENT AND PLAN:  Discussed the following assessment and plan:  1. Essential hypertension -BP back to within normal limits.  We will have her  continue to monitor her blood pressure at home periodically throughout the week.  Encouraged exercise, low-sodium diet, and adequate hydration. She will follow-up for CPE     I discussed the assessment and treatment plan with the patient. The patient was provided an opportunity to ask questions and all were answered. The patient agreed with the plan and demonstrated an understanding of the instructions.   The patient was advised to call back or seek an in-person evaluation if the symptoms worsen or if the condition fails to improve as anticipated.   [name], NP

## 2019-04-01 MED FILL — BIKTARVY 50-200-25 MG TABS: 50-200-25 | 30 days supply | Qty: 30 | Fill #4

## 2019-04-24 ENCOUNTER — Other Ambulatory Visit: Payer: Self-pay | Admitting: Adult Health

## 2019-04-24 DIAGNOSIS — I10 Essential (primary) hypertension: Secondary | ICD-10-CM

## 2019-04-24 NOTE — Telephone Encounter (Signed)
Sent to the pharmacy by e-scribe for 90 days.  Pt has upcoming cpx. 

## 2019-04-29 MED FILL — BIKTARVY 50-200-25 MG TABS: 50-200-25 | 30 days supply | Qty: 30 | Fill #5

## 2019-05-21 ENCOUNTER — Encounter: Payer: Self-pay | Admitting: Adult Health

## 2019-05-21 ENCOUNTER — Other Ambulatory Visit: Payer: Self-pay

## 2019-05-21 ENCOUNTER — Ambulatory Visit (INDEPENDENT_AMBULATORY_CARE_PROVIDER_SITE_OTHER): Payer: BLUE CROSS/BLUE SHIELD | Admitting: Adult Health

## 2019-05-21 VITALS — BP 130/94 | HR 70 | Temp 98.0°F | Ht 70.0 in | Wt 202.7 lb

## 2019-05-21 DIAGNOSIS — B20 Human immunodeficiency virus [HIV] disease: Secondary | ICD-10-CM | POA: Diagnosis not present

## 2019-05-21 DIAGNOSIS — Z23 Encounter for immunization: Secondary | ICD-10-CM | POA: Diagnosis not present

## 2019-05-21 DIAGNOSIS — F411 Generalized anxiety disorder: Secondary | ICD-10-CM

## 2019-05-21 DIAGNOSIS — Z0000 Encounter for general adult medical examination without abnormal findings: Secondary | ICD-10-CM

## 2019-05-21 DIAGNOSIS — I10 Essential (primary) hypertension: Secondary | ICD-10-CM

## 2019-05-21 LAB — COMPREHENSIVE METABOLIC PANEL
ALT: 8 U/L (ref 0–35)
AST: 12 U/L (ref 0–37)
Albumin: 4.1 g/dL (ref 3.5–5.2)
Alkaline Phosphatase: 87 U/L (ref 39–117)
BUN: 13 mg/dL (ref 6–23)
CO2: 34 mEq/L — ABNORMAL HIGH (ref 19–32)
Calcium: 9.6 mg/dL (ref 8.4–10.5)
Chloride: 102 mEq/L (ref 96–112)
Creatinine, Ser: 1.13 mg/dL (ref 0.40–1.20)
GFR: 60.96 mL/min (ref >60.00)
Glucose, Bld: 84 mg/dL (ref 70–99)
Potassium: 3.9 mEq/L (ref 3.5–5.1)
Sodium: 142 mEq/L (ref 135–145)
Total Bilirubin: 0.4 mg/dL (ref 0.2–1.2)
Total Protein: 7.1 g/dL (ref 6.0–8.3)

## 2019-05-21 LAB — LIPID PANEL
Cholesterol: 194 mg/dL (ref 0–200)
HDL: 47.3 mg/dL (ref >39.00)
LDL Cholesterol: 123 mg/dL — ABNORMAL HIGH (ref 0–99)
NonHDL: 146.51
Total CHOL/HDL Ratio: 4
Triglycerides: 120 mg/dL (ref 0.0–149.0)
VLDL: 24 mg/dL (ref 0.0–40.0)

## 2019-05-21 LAB — CBC WITH DIFFERENTIAL/PLATELET
Basophils Absolute: 0 10*3/uL (ref 0.0–0.1)
Basophils Relative: 0.8 % (ref 0.0–3.0)
Eosinophils Absolute: 0.1 10*3/uL (ref 0.0–0.7)
Eosinophils Relative: 1.5 % (ref 0.0–5.0)
HCT: 40.8 % (ref 36.0–46.0)
Hemoglobin: 13.4 g/dL (ref 12.0–15.0)
Lymphocytes Relative: 41.4 % (ref 12.0–46.0)
Lymphs Abs: 1.8 10*3/uL (ref 0.7–4.0)
MCHC: 32.9 g/dL (ref 30.0–36.0)
MCV: 86.7 fl (ref 78.0–100.0)
Monocytes Absolute: 0.4 10*3/uL (ref 0.1–1.0)
Monocytes Relative: 8.1 % (ref 3.0–12.0)
Neutro Abs: 2.1 10*3/uL (ref 1.4–7.7)
Neutrophils Relative %: 48.2 % (ref 43.0–77.0)
Platelets: 302 10*3/uL (ref 150.0–400.0)
RBC: 4.7 Mil/uL (ref 3.87–5.11)
RDW: 14.3 % (ref 11.5–15.5)
WBC: 4.4 10*3/uL (ref 4.0–10.5)

## 2019-05-21 LAB — TSH: TSH: 2.07 u[IU]/mL (ref 0.35–4.50)

## 2019-05-21 MED ORDER — HYDROCHLOROTHIAZIDE 25 MG PO TABS: 25.0000 mg | ORAL_TABLET | Freq: Every day | ORAL | 3 refills | Status: DC

## 2019-05-21 NOTE — Addendum Note (Signed)
Addended by: Westley Hummer B on: 05/21/2019 08:28 AM   Modules accepted: Orders

## 2019-05-21 NOTE — Patient Instructions (Signed)
It was great seeing you today   We will follow up with you regarding your blood work   Please let me know if you need anything over the next year

## 2019-05-21 NOTE — Progress Notes (Signed)
Subjective:    Patient ID: [name], female    DOB: 1965-09-12, 53 y.o.   MRN: [id number]  HPI Patient presents for yearly preventative medicine examination. She is a pleasant 53 year old female who  has a past medical history of Allergy, Anxiety, HIV (human immunodeficiency virus infection) (HCC), Hypertension, Insomnia, and Migraine.  Essential Hypertension -currently prescribed HCTZ 25 mg. BP Readings from Last 3 Encounters:  05/21/19 (!) 130/94  01/22/18 137/84  08/10/17 (!) 110/92   HIV -followed by infectious disease.  Currently prescribed Biktarvy  Anxiety -situational.  She takes Klonopin as needed.  Last refill was July 2019  All immunizations and health maintenance protocols were reviewed with the patient and needed orders were placed. She is due for influenza vaccination  Appropriate screening laboratory values were ordered for the patient including screening of hyperlipidemia, renal function and hepatic function.  Medication reconciliation,  past medical history, social history, problem list and allergies were reviewed in detail with the patient  Goals were established with regard to weight loss, exercise, and  diet in compliance with medications. She is eating healthy and has been trying to stay active but has stopped walking  Wt Readings from Last 3 Encounters:  05/21/19 202 lb 11.2 oz (91.9 kg)  01/22/18 197 lb (89.4 kg)  08/10/17 199 lb (90.3 kg)   She has been seen by GYN and is up-to-date on mammogram and Pap smear.  Last colonoscopy was in 2018 and is next due in 2028.   Review of Systems  Constitutional: Negative.   HENT: Negative.   Eyes: Negative.   Respiratory: Negative.   Cardiovascular: Negative.   Gastrointestinal: Negative.   Endocrine: Negative.   Genitourinary: Negative.   Musculoskeletal: Negative.   Skin: Negative.   Allergic/Immunologic: Negative.   Neurological: Negative.   Hematological: Negative.   Psychiatric/Behavioral:  Negative.    Past Medical History:  Diagnosis Date  . Allergy   . Anxiety   . HIV (human immunodeficiency virus infection) (HCC)   . Hypertension   . Insomnia   . Migraine     Social History   Socioeconomic History  . Marital status: Single    Spouse name: Not on file  . Number of children: 1  . Years of education: Not on file  . Highest education level: Not on file  Occupational History  . Occupation: Geophysical data processor: [employer]  Social Needs  . Financial resource strain: Not on file  . Food insecurity    Worry: Not on file    Inability: Not on file  . Transportation needs    Medical: Not on file    Non-medical: Not on file  Tobacco Use  . Smoking status: Never Smoker  . Smokeless tobacco: Never Used  Substance and Sexual Activity  . Alcohol use: Yes    Alcohol/week: 0.0 standard drinks    Comment: socially  . Drug use: No  . Sexual activity: Yes    Partners: Male    Birth control/protection: Abstinence, Surgical  Lifestyle  . Physical activity    Days per week: Not on file    Minutes per session: Not on file  . Stress: Not on file  Relationships  . Social Musician on phone: Not on file    Gets together: Not on file    Attends religious service: Not on file    Active member of club or organization: Not on file    Attends  meetings of clubs or organizations: Not on file    Relationship status: Not on file  . Intimate partner violence    Fear of current or ex partner: Not on file    Emotionally abused: Not on file    Physically abused: Not on file    Forced sexual activity: Not on file  Other Topics Concern  . Not on file  Social History Narrative   She works at [employer] as an Web designer for [employer]    Divorced   One son who lives in [place]      She likes to shop and travel.     Past Surgical History:  Procedure Laterality Date  . ABDOMINAL HYSTERECTOMY Right 2004   partial    Family History   Problem Relation Age of Onset  . Cerebral aneurysm Father 57  . Aortic aneurysm Father   . Diabetes Other   . Hypertension Other   . Brain cancer Other   . Breast cancer Neg Hx   . Colon cancer Neg Hx   . Esophageal cancer Neg Hx   . Stomach cancer Neg Hx   . Rectal cancer Neg Hx     Allergies  Allergen Reactions  . Iodinated Diagnostic Agents Other (See Comments)    Pain in my eyes .   Marland Kitchen Sulfonamide Derivatives Hives    Current Outpatient Medications on File Prior to Visit  Medication Sig Dispense Refill  . BIKTARVY 50-200-25 MG TABS tablet TAKE 1 TABLET BY MOUTH DAILY. 90 tablet 2  . clonazePAM (KLONOPIN) 0.5 MG tablet Take 1 tablet (0.5 mg total) by mouth daily as needed for anxiety. 90 tablet 0  . hydrochlorothiazide (HYDRODIURIL) 25 MG tablet Take 1 tablet by mouth once daily 90 tablet 0  . meclizine (ANTIVERT) 25 MG tablet Take 1 tablet (25 mg total) by mouth 3 (three) times daily as needed for dizziness. 30 tablet 0  . Multiple Vitamins-Minerals (OCUVITE PO) Take 1 tablet by mouth daily.    . ondansetron (ZOFRAN ODT) 4 MG disintegrating tablet Take 1 tablet (4 mg total) by mouth every 8 (eight) hours as needed for nausea or vomiting. 20 tablet 0   No current facility-administered medications on file prior to visit.     BP (!) 130/94 (BP Location: Left Arm, Patient Position: Sitting, Cuff Size: Normal)   Pulse 70   Temp 98 F (36.7 C) (Temporal)   Ht 5\' 10"  (1.778 m)   Wt 202 lb 11.2 oz (91.9 kg)   SpO2 97%   BMI 29.08 kg/m       Objective:   Physical Exam Vitals signs and nursing note reviewed.  Constitutional:      General: She is not in acute distress.    Appearance: She is obese. She is not diaphoretic.  HENT:     Head: Normocephalic and atraumatic.     Right Ear: Tympanic membrane, ear canal and external ear normal. There is no impacted cerumen.     Left Ear: Tympanic membrane, ear canal and external ear normal. There is no impacted cerumen.      Nose: Nose normal. No congestion or rhinorrhea.     Mouth/Throat:     Mouth: Mucous membranes are moist.     Pharynx: Oropharynx is clear. No oropharyngeal exudate or posterior oropharyngeal erythema.  Eyes:     General: No scleral icterus.       Right eye: No discharge.        Left eye: No discharge.  Extraocular Movements: Extraocular movements intact.     Conjunctiva/sclera: Conjunctivae normal.     Pupils: Pupils are equal, round, and reactive to light.  Neck:     Musculoskeletal: Normal range of motion and neck supple.     Thyroid: No thyromegaly.     Vascular: No JVD.     Trachea: No tracheal deviation.  Cardiovascular:     Rate and Rhythm: Normal rate and regular rhythm.     Pulses: Normal pulses.     Heart sounds: Normal heart sounds. No murmur. No friction rub. No gallop.   Pulmonary:     Effort: Pulmonary effort is normal. No respiratory distress.     Breath sounds: Normal breath sounds. No stridor. No wheezing or rales.  Chest:     Chest wall: No tenderness.  Abdominal:     General: Abdomen is flat. Bowel sounds are normal. There is no distension.     Palpations: Abdomen is soft. There is no mass.     Tenderness: There is no abdominal tenderness. There is no right CVA tenderness, left CVA tenderness, guarding or rebound.     Hernia: No hernia is present.  Musculoskeletal: Normal range of motion.        General: No swelling, tenderness, deformity or signs of injury.     Right lower leg: No edema.     Left lower leg: No edema.  Lymphadenopathy:     Cervical: No cervical adenopathy.  Skin:    General: Skin is warm and dry.     Capillary Refill: Capillary refill takes less than 2 seconds.     Coloration: Skin is not jaundiced or pale.     Findings: No bruising, erythema, lesion or rash.  Neurological:     General: No focal deficit present.     Mental Status: She is alert. Mental status is at baseline. She is disoriented.     Cranial Nerves: No cranial nerve  deficit.     Sensory: No sensory deficit.     Motor: No weakness or abnormal muscle tone.     Coordination: Coordination normal.     Gait: Gait normal.     Deep Tendon Reflexes: Reflexes normal.  Psychiatric:        Mood and Affect: Mood normal.        Behavior: Behavior normal.        Thought Content: Thought content normal.        Judgment: Judgment normal.       Assessment & Plan:  1. Routine general medical examination at a health care facility - Work on weight loss through diet and  - CBC with Differential/Platelet - Comprehensive metabolic panel - Lipid panel - TSH  2. Essential hypertension - No change in medication therapy  - CBC with Differential/Platelet - Comprehensive metabolic panel - Lipid panel - TSH - hydrochlorothiazide (HYDRODIURIL) 25 MG tablet; Take 1 tablet (25 mg total) by mouth daily.  Dispense: 90 tablet; Refill: 3  3. Anxiety state - Continue with klonopin as needed  4. Human immunodeficiency virus (HIV) disease (HCC) - Follow up with ID as directed  - CBC with Differential/Platelet - Comprehensive metabolic panel - Lipid panel - TSH   Shirline Freesory Cathi Hazan, NP

## 2019-05-29 MED FILL — BIKTARVY 50-200-25 MG TABS: 50-200-25 | 30 days supply | Qty: 30 | Fill #6

## 2019-06-27 MED FILL — BIKTARVY 50-200-25 MG TABS: 50-200-25 | 30 days supply | Qty: 30 | Fill #7

## 2019-07-25 MED FILL — BIKTARVY 50-200-25 MG TABS: 50-200-25 | 30 days supply | Qty: 30 | Fill #8

## 2019-08-22 ENCOUNTER — Other Ambulatory Visit: Payer: Self-pay | Admitting: Infectious Diseases

## 2019-08-22 DIAGNOSIS — B20 Human immunodeficiency virus [HIV] disease: Secondary | ICD-10-CM

## 2019-08-27 MED FILL — BIKTARVY 50-200-25 MG TABS: 50-200-25 | 30 days supply | Qty: 30 | Fill #0

## 2019-09-30 MED FILL — BIKTARVY 50-200-25 MG TABS: 50-200-25 | 30 days supply | Qty: 30 | Fill #1

## 2019-10-14 NOTE — Addendum Note (Signed)
Addended by: Mariea Clonts D on: 10/14/2019 02:34 PM   Modules accepted: Orders

## 2019-10-18 ENCOUNTER — Other Ambulatory Visit: Payer: Self-pay

## 2019-10-18 ENCOUNTER — Other Ambulatory Visit: Payer: BC Managed Care – PPO

## 2019-10-18 DIAGNOSIS — Z79899 Other long term (current) drug therapy: Secondary | ICD-10-CM

## 2019-10-18 DIAGNOSIS — Z113 Encounter for screening for infections with a predominantly sexual mode of transmission: Secondary | ICD-10-CM

## 2019-10-18 DIAGNOSIS — B20 Human immunodeficiency virus [HIV] disease: Secondary | ICD-10-CM | POA: Diagnosis not present

## 2019-10-18 LAB — T-HELPER CELL (CD4) - (RCID CLINIC ONLY)
CD4 % Helper T Cell: 43 % (ref 33–65)
CD4 T Cell Abs: 796 /uL (ref 400–1790)

## 2019-10-21 LAB — CBC
HCT: 38.5 % (ref 35.0–45.0)
Hemoglobin: 12.7 g/dL (ref 11.7–15.5)
MCH: 28.3 pg (ref 27.0–33.0)
MCHC: 33 g/dL (ref 32.0–36.0)
MCV: 85.9 fL (ref 80.0–100.0)
MPV: 9.2 fL (ref 7.5–12.5)
Platelets: 354 10*3/uL (ref 140–400)
RBC: 4.48 10*6/uL (ref 3.80–5.10)
RDW: 13.6 % (ref 11.0–15.0)
WBC: 4.5 10*3/uL (ref 3.8–10.8)

## 2019-10-21 LAB — LIPID PANEL
Cholesterol: 190 mg/dL (ref <200)
HDL: 54 mg/dL (ref >=50)
LDL Cholesterol (Calc): 115 mg/dL (calc) — ABNORMAL HIGH
Non-HDL Cholesterol (Calc): 136 mg/dL (calc) — ABNORMAL HIGH (ref <130)
Total CHOL/HDL Ratio: 3.5 (calc) (ref <5.0)
Triglycerides: 104 mg/dL (ref <150)

## 2019-10-21 LAB — COMPREHENSIVE METABOLIC PANEL
AG Ratio: 1.4 (calc) (ref 1.0–2.5)
ALT: 9 U/L (ref 6–29)
AST: 12 U/L (ref 10–35)
Albumin: 4.1 g/dL (ref 3.6–5.1)
Alkaline phosphatase (APISO): 81 U/L (ref 37–153)
BUN: 9 mg/dL (ref 7–25)
CO2: 31 mmol/L (ref 20–32)
Calcium: 9.5 mg/dL (ref 8.6–10.4)
Chloride: 102 mmol/L (ref 98–110)
Creat: 1.05 mg/dL (ref 0.50–1.05)
Globulin: 2.9 g/dL (calc) (ref 1.9–3.7)
Glucose, Bld: 83 mg/dL (ref 65–99)
Potassium: 3.8 mmol/L (ref 3.5–5.3)
Sodium: 141 mmol/L (ref 135–146)
Total Bilirubin: 0.3 mg/dL (ref 0.2–1.2)
Total Protein: 7 g/dL (ref 6.1–8.1)

## 2019-10-21 LAB — RPR: RPR Ser Ql: NONREACTIVE

## 2019-10-21 LAB — HIV-1 RNA QUANT-NO REFLEX-BLD
HIV 1 RNA Quant: <20 copies/mL
HIV-1 RNA Quant, Log: <1.3 Log copies/mL

## 2019-10-29 ENCOUNTER — Other Ambulatory Visit: Payer: Self-pay

## 2019-10-29 MED FILL — BIKTARVY 50-200-25 MG TABS: 50-200-25 | 30 days supply | Qty: 30 | Fill #2

## 2019-10-30 ENCOUNTER — Other Ambulatory Visit (HOSPITAL_COMMUNITY)
Admission: RE | Admit: 2019-10-30 | Discharge: 2019-10-30 | Disposition: A | Discharge status: Home / Self Care | Payer: BC Managed Care – PPO | Source: Ambulatory Visit | Attending: Infectious Diseases | Admitting: Infectious Diseases

## 2019-10-30 ENCOUNTER — Ambulatory Visit (INDEPENDENT_AMBULATORY_CARE_PROVIDER_SITE_OTHER): Payer: BC Managed Care – PPO | Admitting: Infectious Diseases

## 2019-10-30 ENCOUNTER — Ambulatory Visit (INDEPENDENT_AMBULATORY_CARE_PROVIDER_SITE_OTHER): Payer: BC Managed Care – PPO

## 2019-10-30 ENCOUNTER — Encounter: Payer: Self-pay | Admitting: Infectious Diseases

## 2019-10-30 DIAGNOSIS — B20 Human immunodeficiency virus [HIV] disease: Secondary | ICD-10-CM

## 2019-10-30 DIAGNOSIS — I10 Essential (primary) hypertension: Secondary | ICD-10-CM | POA: Diagnosis not present

## 2019-10-30 DIAGNOSIS — Z23 Encounter for immunization: Secondary | ICD-10-CM

## 2019-10-30 DIAGNOSIS — Z113 Encounter for screening for infections with a predominantly sexual mode of transmission: Secondary | ICD-10-CM

## 2019-10-30 DIAGNOSIS — G4700 Insomnia, unspecified: Secondary | ICD-10-CM | POA: Diagnosis not present

## 2019-10-30 DIAGNOSIS — E881 Lipodystrophy, not elsewhere classified: Secondary | ICD-10-CM

## 2019-10-30 DIAGNOSIS — R42 Dizziness and giddiness: Secondary | ICD-10-CM | POA: Diagnosis not present

## 2019-10-30 MED ORDER — BIKTARVY 50-200-25 MG PO TABS: 1.0000 | ORAL_TABLET | Freq: Every day | ORAL | 3 refills | Status: DC

## 2019-10-30 NOTE — Addendum Note (Signed)
Addended by: Clista Bernhardt on: 10/30/2019 09:47 AM   Modules accepted: Orders

## 2019-10-30 NOTE — Assessment & Plan Note (Signed)
Well controlled on HCTZ Will continue to watch.  

## 2019-10-30 NOTE — Assessment & Plan Note (Signed)
Improved on melatonin.

## 2019-10-30 NOTE — Assessment & Plan Note (Addendum)
Will change her to depo cabo when available. We spoke about changing to prezcobix however she has had sulfa allergy in past.  If not improved, will send to wt loss clinic.

## 2019-10-30 NOTE — Progress Notes (Signed)
Per orders of Shirline Frees, NP, injection of last 2nd Shingrix vaccine given by Sherrin Daisy. Patient tolerated injection well.

## 2019-10-30 NOTE — Progress Notes (Signed)
   Subjective:    Patient ID: [name], female    DOB: 1965/10/18, 54 y.o.   MRN: [id number]  HPI 53yo F with hx of HIV+, was previously ontruvada/nelfinavirthen switched to complera for simplicity. 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 In February 2013 was changed totivicay-descovy--> biktarvy. She took orlistat for a short period for wt loss, (has gained 25#). Has been riding stationary bike 4 days/week. Has been watching diet. Has been intermittently fasting as well. Drinking 5-6 bottles water/day.   She was having vertigo and headaches. At her last visit was referred to Neuro- no appointment made. For now, feels like this is under control.   Has lasik surgery on L eye. Her vision has normalized.   Sleep better with melatonin gummies.   Was seen by Dr Tina Griffiths for GYN- 10-2018. F/u 11-2019. Hyst 2003.  Mammo due 01-2020.  Had colon 2018.  2nd dose shingrix today.   HIV 1 RNA Quant (copies/mL)  Date Value  10/18/2019 <20 NOT DETECTED  10/31/2018 <20 NOT DETECTED  01/22/2018 <20 NOT DETECTED   CD4 T Cell Abs (/uL)  Date Value  10/18/2019 796  07/05/2017 940  12/12/2016 690    Review of Systems  Constitutional: Positive for unexpected weight change. Negative for activity change.  Gastrointestinal: Positive for constipation (has been taking senokot s, prunes. ). Negative for diarrhea.  Genitourinary: Negative for difficulty urinating and vaginal bleeding.  Psychiatric/Behavioral: Negative for sleep disturbance.       Objective:   Physical Exam Vitals reviewed.  Constitutional:      Appearance: Normal appearance. She is obese.  HENT:     Mouth/Throat:     Mouth: Mucous membranes are moist.     Pharynx: No oropharyngeal exudate.  Eyes:     Extraocular Movements: Extraocular movements intact.     Pupils: Pupils are equal, round, and reactive to light.  Cardiovascular:     Rate and Rhythm: Normal  rate and regular rhythm.  Pulmonary:     Effort: Pulmonary effort is normal.     Breath sounds: Normal breath sounds.  Abdominal:     General: Bowel sounds are normal. There is no distension.     Palpations: Abdomen is soft.     Tenderness: There is no abdominal tenderness.  Musculoskeletal:        General: Normal range of motion.     Cervical back: Normal range of motion and neck supple.     Right lower leg: No edema.     Left lower leg: No edema.  Neurological:     General: No focal deficit present.     Mental Status: She is alert.  Psychiatric:        Mood and Affect: Mood normal.           Assessment & Plan:

## 2019-10-30 NOTE — Assessment & Plan Note (Addendum)
She is doing well except for wt gain Will change her to cabo injectable when available.  We spoke about changing her to prezcobix however she has hx of sulfa hives. She also has hx of eye problems with NNRTI (odefsy).  Has gotten flu shot, not covid yet.  Advised her on adr to shingrix.  Offered/refused condoms.   rtc in 6 months.

## 2019-10-30 NOTE — Assessment & Plan Note (Signed)
Much better. Will send to neuro prn.

## 2019-10-31 LAB — URINE CYTOLOGY ANCILLARY ONLY
Chlamydia: NEGATIVE
Comment: NEGATIVE
Comment: NORMAL
Neisseria Gonorrhea: NEGATIVE

## 2019-11-01 ENCOUNTER — Ambulatory Visit: Payer: BLUE CROSS/BLUE SHIELD

## 2019-11-01 ENCOUNTER — Encounter: Payer: BLUE CROSS/BLUE SHIELD | Admitting: Infectious Diseases

## 2019-11-14 ENCOUNTER — Other Ambulatory Visit: Payer: Self-pay

## 2019-11-15 ENCOUNTER — Ambulatory Visit (INDEPENDENT_AMBULATORY_CARE_PROVIDER_SITE_OTHER): Payer: BC Managed Care – PPO | Admitting: Adult Health

## 2019-11-15 ENCOUNTER — Encounter: Payer: Self-pay | Admitting: Adult Health

## 2019-11-15 VITALS — BP 124/80 | Temp 97.7°F | Wt 213.0 lb

## 2019-11-15 DIAGNOSIS — F411 Generalized anxiety disorder: Secondary | ICD-10-CM | POA: Diagnosis not present

## 2019-11-15 DIAGNOSIS — T50Z95A Adverse effect of other vaccines and biological substances, initial encounter: Secondary | ICD-10-CM | POA: Diagnosis not present

## 2019-11-15 MED ORDER — CLONAZEPAM 0.5 MG PO TABS: 0.5000 mg | ORAL_TABLET | Freq: Every day | ORAL | 0 refills | Status: DC | PRN

## 2019-11-15 NOTE — Progress Notes (Signed)
Subjective:    Patient ID: [name], female    DOB: 1965/09/21, 54 y.o.   MRN: [id number]  HPI 54 year old female who  has a past medical history of Allergy, Anxiety, HIV (human immunodeficiency virus infection) ([place]), Hypertension, Insomnia, and Migraine.  He presents to the office today for concern of vaccine reaction from Shingrix.  She had her second Shingrix vaccination on 10/30/2019 and within 24 hours after the vaccination she developed swelling in her fingers and toes, rapid heart rate up to 117 and elevated blood pressure above 761 systolic.  She reports that her symptoms resolved when she woke up the next morning.  She does report taking a dose of prescribed Klonopin which helped bring her heart rate and blood pressure down.  She is wondering if it is okay for her to get the Covid vaccination   Review of Systems See HPI   Past Medical History:  Diagnosis Date  . Allergy   . Anxiety   . HIV (human immunodeficiency virus infection) ([place])   . Hypertension   . Insomnia   . Migraine     Social History   Socioeconomic History  . Marital status: Single    Spouse name: Not on file  . Number of children: 1  . Years of education: Not on file  . Highest education level: Not on file  Occupational History  . Occupation: Facilities manager: [employer]  Tobacco Use  . Smoking status: Never Smoker  . Smokeless tobacco: Never Used  Substance and Sexual Activity  . Alcohol use: Yes    Alcohol/week: 0.0 standard drinks    Comment: socially  . Drug use: No  . Sexual activity: Yes    Partners: Male    Birth control/protection: Abstinence, Surgical  Other Topics Concern  . 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    Social Determinants of Health   Financial Resource Strain:   . Difficulty of Paying  Living Expenses:   Food Insecurity:   . Worried About Charity fundraiser in the Last Year:   . Arboriculturist in the Last Year:   Transportation Needs:   . Film/video editor (Medical):   Marland Kitchen Lack of Transportation (Non-Medical):   Physical Activity:   . Days of Exercise per Week:   . Minutes of Exercise per Session:   Stress:   . Feeling of Stress :   Social Connections:   . Frequency of Communication with Friends and Family:   . Frequency of Social Gatherings with Friends and Family:   . Attends Religious Services:   . Active Member of Clubs or Organizations:   . Attends Archivist Meetings:   Marland Kitchen Marital Status:   Intimate Partner Violence:   . Fear of Current or Ex-Partner:   . Emotionally Abused:   Marland Kitchen Physically Abused:   . Sexually Abused:     Past Surgical History:  Procedure Laterality Date  . ABDOMINAL HYSTERECTOMY Right 2004   partial    Family History  Problem Relation Age of Onset  . Cerebral aneurysm Father 60  . Aortic aneurysm Father   . Diabetes Other   . Hypertension Other   . Brain cancer Other   . Breast cancer Neg  Hx   . Colon cancer Neg Hx   . Esophageal cancer Neg Hx   . Stomach cancer Neg Hx   . Rectal cancer Neg Hx     Allergies  Allergen Reactions  . Iodinated Diagnostic Agents Other (See Comments)    Pain in my eyes .   Marland Kitchen Sulfonamide Derivatives Hives    Current Outpatient Medications on File Prior to Visit  Medication Sig Dispense Refill  . bictegravir-emtricitabine-tenofovir AF (BIKTARVY) 50-200-25 MG TABS tablet Take 1 tablet by mouth daily. 90 tablet 3  . hydrochlorothiazide (HYDRODIURIL) 25 MG tablet Take 1 tablet (25 mg total) by mouth daily. 90 tablet 3  . meclizine (ANTIVERT) 25 MG tablet Take 1 tablet (25 mg total) by mouth 3 (three) times daily as needed for dizziness. (Patient not taking: Reported on 11/15/2019) 30 tablet 0  . ondansetron (ZOFRAN ODT) 4 MG disintegrating tablet Take 1 tablet (4 mg total) by mouth  every 8 (eight) hours as needed for nausea or vomiting. (Patient not taking: Reported on 11/15/2019) 20 tablet 0   No current facility-administered medications on file prior to visit.    BP 124/80   Temp 97.7 F (36.5 C)   Wt 213 lb (96.6 kg)   BMI 33.36 kg/m       Objective:   Physical Exam Vitals and nursing note reviewed.  Constitutional:      Appearance: Normal appearance.  Cardiovascular:     Rate and Rhythm: Normal rate and regular rhythm.     Pulses: Normal pulses.     Heart sounds: Normal heart sounds.  Pulmonary:     Effort: Pulmonary effort is normal.  Skin:    General: Skin is warm and dry.     Capillary Refill: Capillary refill takes less than 2 seconds.  Neurological:     General: No focal deficit present.     Mental Status: She is alert and oriented to person, place, and time.  Psychiatric:        Mood and Affect: Mood normal.        Behavior: Behavior normal.        Thought Content: Thought content normal.        Judgment: Judgment normal.       Assessment & Plan:  1. Adverse effect of vaccine, initial encounter -Appears likely to be a vaccination reaction thankfully her symptoms have resolved.  I think the benefits outweigh the risks for her getting the Covid vaccination so I advised her to do so.  If needed she can take Klonopin as directed.  Follow-up as needed  2. Anxiety state  - clonazePAM (KLONOPIN) 0.5 MG tablet; Take 1 tablet (0.5 mg total) by mouth daily as needed for anxiety.  Dispense: 90 tablet; Refill: 0  Shirline Frees, NP

## 2019-11-15 NOTE — Patient Instructions (Signed)
It was great seeing you today   Please follow up after September 15th for your physical exam

## 2019-11-21 ENCOUNTER — Other Ambulatory Visit: Payer: Self-pay | Admitting: Infectious Diseases

## 2019-11-21 DIAGNOSIS — B20 Human immunodeficiency virus [HIV] disease: Secondary | ICD-10-CM

## 2019-11-25 ENCOUNTER — Other Ambulatory Visit: Payer: Self-pay | Admitting: Infectious Diseases

## 2019-11-25 ENCOUNTER — Telehealth: Payer: Self-pay | Admitting: Adult Health

## 2019-11-25 DIAGNOSIS — B20 Human immunodeficiency virus [HIV] disease: Secondary | ICD-10-CM

## 2019-11-25 NOTE — Telephone Encounter (Signed)
Pt employer is offering covid vaccine and pt had a reaction from shingles vaccine and wanted to see if [name] feels that pt should get pfizer or wait for J&J.  Pt is scheduled to have the vaccine tomorrow.

## 2019-11-26 NOTE — Telephone Encounter (Signed)
Advised pt of previous note from [place], pt said she just received 1st dose of Pfizer.

## 2019-11-26 NOTE — Telephone Encounter (Signed)
Left a message for a return call.

## 2019-11-26 NOTE — Telephone Encounter (Signed)
Either Pfizer or J&J are safe to get.

## 2019-11-28 MED FILL — BIKTARVY 50-200-25 MG TABS: 50-200-25 | 30 days supply | Qty: 30 | Fill #0

## 2019-12-30 MED FILL — BIKTARVY 50-200-25 MG TABS: 50-200-25 | 30 days supply | Qty: 30 | Fill #1

## 2020-01-08 ENCOUNTER — Telehealth: Payer: Self-pay | Admitting: Pharmacist

## 2020-01-08 NOTE — Telephone Encounter (Signed)
Faxed patient's [facility] enrollment form to [facility] today. They will assess patient's insurance and send a benefits investigation form detailing how to start the injections (where to get it from and cost). Once benefits investigation is complete, we will order oral lead-in therapy and start patient on treatment. Will update encounter with further details when available. 

## 2020-01-09 NOTE — Telephone Encounter (Signed)
Thanks

## 2020-01-28 MED FILL — BIKTARVY 50-200-25 MG TABS: 50-200-25 | 30 days supply | Qty: 30 | Fill #2

## 2020-01-30 ENCOUNTER — Other Ambulatory Visit: Payer: Self-pay | Admitting: Obstetrics & Gynecology

## 2020-01-30 DIAGNOSIS — Z1231 Encounter for screening mammogram for malignant neoplasm of breast: Secondary | ICD-10-CM

## 2020-02-27 ENCOUNTER — Other Ambulatory Visit: Payer: Self-pay

## 2020-02-27 ENCOUNTER — Ambulatory Visit
Admission: RE | Admit: 2020-02-27 | Discharge: 2020-02-27 | Disposition: A | Discharge status: Home / Self Care | Payer: BC Managed Care – PPO | Source: Ambulatory Visit | Attending: Obstetrics & Gynecology | Admitting: Obstetrics & Gynecology

## 2020-02-27 DIAGNOSIS — Z1231 Encounter for screening mammogram for malignant neoplasm of breast: Secondary | ICD-10-CM

## 2020-03-04 MED FILL — BIKTARVY 50-200-25 MG TABS: 50-200-25 | 30 days supply | Qty: 30 | Fill #3

## 2020-03-30 ENCOUNTER — Other Ambulatory Visit: Payer: BC Managed Care – PPO

## 2020-04-02 MED FILL — BIKTARVY 50-200-25 MG TABS: 50-200-25 | 30 days supply | Qty: 30 | Fill #4

## 2020-04-16 ENCOUNTER — Other Ambulatory Visit: Payer: BC Managed Care – PPO

## 2020-04-16 ENCOUNTER — Other Ambulatory Visit: Payer: Self-pay

## 2020-04-16 DIAGNOSIS — B20 Human immunodeficiency virus [HIV] disease: Secondary | ICD-10-CM

## 2020-04-17 ENCOUNTER — Other Ambulatory Visit: Payer: BC Managed Care – PPO

## 2020-04-17 ENCOUNTER — Encounter: Payer: BC Managed Care – PPO | Admitting: Infectious Diseases

## 2020-04-17 LAB — T-HELPER CELL (CD4) - (RCID CLINIC ONLY)
CD4 % Helper T Cell: 45 % (ref 33–65)
CD4 T Cell Abs: 856 /uL (ref 400–1790)

## 2020-04-19 LAB — HIV-1 RNA QUANT-NO REFLEX-BLD
HIV 1 RNA Quant: <20 Copies/mL
HIV-1 RNA Quant, Log: <1.3 Log cps/mL

## 2020-05-01 ENCOUNTER — Other Ambulatory Visit: Payer: Self-pay

## 2020-05-01 ENCOUNTER — Telehealth (INDEPENDENT_AMBULATORY_CARE_PROVIDER_SITE_OTHER): Payer: BC Managed Care – PPO | Admitting: Infectious Diseases

## 2020-05-01 DIAGNOSIS — Z780 Asymptomatic menopausal state: Secondary | ICD-10-CM

## 2020-05-01 DIAGNOSIS — F411 Generalized anxiety disorder: Secondary | ICD-10-CM

## 2020-05-01 DIAGNOSIS — T7840XD Allergy, unspecified, subsequent encounter: Secondary | ICD-10-CM

## 2020-05-01 DIAGNOSIS — I10 Essential (primary) hypertension: Secondary | ICD-10-CM

## 2020-05-01 DIAGNOSIS — B20 Human immunodeficiency virus [HIV] disease: Secondary | ICD-10-CM

## 2020-05-01 DIAGNOSIS — T7840XA Allergy, unspecified, initial encounter: Secondary | ICD-10-CM | POA: Insufficient documentation

## 2020-05-01 NOTE — Assessment & Plan Note (Signed)
Pap is pending with Dr Tamela Oddi.

## 2020-05-01 NOTE — Assessment & Plan Note (Addendum)
She is doing well Will defer cabaneuva for now at her request- she has injection fatigue.  She is single.  Has gotten COVID vax.  Re-eval visit with healthy wt and wellness if "gym doesn't make a difference" rtc in 6 months.

## 2020-05-01 NOTE — Assessment & Plan Note (Signed)
Will have her seen by janet.

## 2020-05-01 NOTE — Progress Notes (Signed)
   Subjective:    Patient ID: [name], female    DOB: [birth year], 54 y.o.   MRN: [id number]  HPI 53yo F with hx of HIV+, was previously ontruvada/nelfinavirthen switched to complera for simplicity. 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 In February 2013 was changed totivicay-descovy--> biktarvy. She took orlistat for a short period for wt loss, (has gained 25#). Has been riding stationary bike- wt stable over last year. She feels like her "weight is ok". Doesn't weight herself.   She has some headaches, have been worse with going outside. Thinks it may be related to allergies.  Taking prn zyrtec.   Sleep better with melatonin gummies.   Was seen by PCP this spring for adr to shingles vax.   Was seen by Dr Tina Griffiths for GYN- 10-2018. F/u 11-2019. Hyst 2003.  Mammo 02-2020 (negative).  Had colon 2018.  HIV 1 RNA Quant  Date Value  04/16/2020 <20 Copies/mL  10/18/2019 <20 NOT DETECTED copies/mL  10/31/2018 <20 NOT DETECTED copies/mL   CD4 T Cell Abs (/uL)  Date Value  04/16/2020 856  10/18/2019 796  07/05/2017 940   Has been staying isolated.  Worried her mood is off, not sure she is depressed.   Review of Systems  Constitutional: Negative for appetite change, chills, fever and unexpected weight change.  HENT: Positive for postnasal drip, sneezing and sore throat (itchy, allergy equivalent. ). Negative for sinus pain.   Gastrointestinal: Positive for constipation (occasional senokot). Negative for diarrhea.  Genitourinary: Negative for difficulty urinating.  Neurological: Positive for headaches.       Objective:   Physical Exam 1. Due to the national emergency this service was provided using telemedicine. Video.   2. Consent from the patient for the telehealth visit and that you identified patient- name and DOB.   3. Your locations, Pt and Provider- home, [facility].   4. Chief complaint for visit- ID f/u.    5. Document anyone else on the call- no one.   6. If the visit was a phone call, that you include the time you spent on the call. 10-15 minutes         Assessment & Plan:

## 2020-05-01 NOTE — Assessment & Plan Note (Signed)
Will continue her hctz.

## 2020-05-01 NOTE — Assessment & Plan Note (Signed)
She is taking prn zyrtec. Discussed about taking flonase on a prn basis ( no more than 1 week consistently).

## 2020-05-06 MED FILL — BIKTARVY 50-200-25 MG TABS: 50-200-25 | 30 days supply | Qty: 30 | Fill #5

## 2020-05-12 ENCOUNTER — Telehealth: Payer: Self-pay | Admitting: Adult Health

## 2020-05-12 NOTE — Telephone Encounter (Signed)
Pt called to say she hit her head on her cabinet and she has a gash now. She wants to see if she can get a CT scan.  Please advise

## 2020-05-13 ENCOUNTER — Emergency Department (HOSPITAL_COMMUNITY)
Admission: EM | Admit: 2020-05-13 | Discharge: 2020-05-13 | Disposition: A | Discharge status: Home / Self Care | Payer: BC Managed Care – PPO | Attending: Emergency Medicine | Admitting: Emergency Medicine

## 2020-05-13 ENCOUNTER — Emergency Department (HOSPITAL_COMMUNITY): Payer: BC Managed Care – PPO

## 2020-05-13 ENCOUNTER — Encounter (HOSPITAL_COMMUNITY): Payer: Self-pay | Admitting: *Deleted

## 2020-05-13 ENCOUNTER — Other Ambulatory Visit: Payer: Self-pay

## 2020-05-13 DIAGNOSIS — Y939 Activity, unspecified: Secondary | ICD-10-CM | POA: Insufficient documentation

## 2020-05-13 DIAGNOSIS — I10 Essential (primary) hypertension: Secondary | ICD-10-CM | POA: Insufficient documentation

## 2020-05-13 DIAGNOSIS — B20 Human immunodeficiency virus [HIV] disease: Secondary | ICD-10-CM | POA: Diagnosis not present

## 2020-05-13 DIAGNOSIS — Y9289 Other specified places as the place of occurrence of the external cause: Secondary | ICD-10-CM | POA: Insufficient documentation

## 2020-05-13 DIAGNOSIS — Y999 Unspecified external cause status: Secondary | ICD-10-CM | POA: Diagnosis not present

## 2020-05-13 DIAGNOSIS — X58XXXA Exposure to other specified factors, initial encounter: Secondary | ICD-10-CM | POA: Insufficient documentation

## 2020-05-13 DIAGNOSIS — S060X1A Concussion with loss of consciousness of 30 minutes or less, initial encounter: Secondary | ICD-10-CM

## 2020-05-13 DIAGNOSIS — S0990XA Unspecified injury of head, initial encounter: Secondary | ICD-10-CM | POA: Diagnosis not present

## 2020-05-13 NOTE — ED Triage Notes (Signed)
Pt raised up and hit head on cabinet door yesterday, states she was out for a minute or two, has felt groggy since, slight headache and nausea. Spoke with PCP and was told to come here.

## 2020-05-13 NOTE — Discharge Instructions (Addendum)
1. Medications: Ibuprofen or Tylenol for pain 2. Treatment: Rest, can use ice on head. Recommend that you remain in a quiet, not simulating, dark environment. No TV, computer use, video games until headache is resolved completely. No contact sports until cleared by your PCP. 3. Follow Up: With primary care physician on Monday if headache persists.  Return to the emergency department if you become lethargic, begin vomiting, develop double vision, speech difficulty, problems walking, or other change in mental status.

## 2020-05-13 NOTE — ED Provider Notes (Signed)
[facility]-EMERGENCY DEPT Provider Note   CSN: [id number] Arrival date & time: 05/13/20  1416     History Chief Complaint  Patient presents with  . Head Injury    [name] is a 54 y.o. female with PMH of HIV on Biktarvy, HTN, anxiety who presents to the ED per recommendation of her primary care provider after sustaining a head injury approximately 40 hours ago. Patient reports that shortly after midnight on 05/12/2020 she stood up in her kitchen and hit the top of her head on the bottom of the cabinet door. She then woke up on the floor shortly thereafter, but uncertain of time when she was unconscious. She suspects no more than 5 minutes. She was able to go to bed that evening, but the following morning she was having nausea along with multiple episodes of nonbloody emesis. She also reported mild worsening dizziness and significant fatigue. Her father passed away from a brain aneurysm and so she was particularly concerned about intracranial hemorrhage. She called her PCP office, but did not get back to her until late last night which is why she came to the ED today for evaluation. She states that the majority of her symptoms have improved, but she still concerned for hemorrhage.  She denies any recent dizziness, blurred vision or other visual deficits, recent nausea or vomiting, chest pain or difficulty breathing, neck pain, ataxia, numbness or weakness, or other symptoms.  HPI     Past Medical History:  Diagnosis Date  . Allergy   . Anxiety   . HIV (human immunodeficiency virus infection) (HCC)   . Hypertension   . Insomnia   . Migraine     Patient Active Problem List   Diagnosis Date Noted  . Allergy 05/01/2020  . Vertigo 10/31/2018  . Menopause 02/06/2017  . Encounter for routine gynecological examination 12/13/2012  . Migraine   . Anxiety state 09/07/2009  . 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 ANEMIA-NOS 10/27/2006  . Human immunodeficiency virus (HIV)  disease (HCC) 06/15/2006  . Essential hypertension 06/15/2006  . Insomnia 06/15/2006    Past Surgical History:  Procedure Laterality Date  . ABDOMINAL HYSTERECTOMY Right 2004   partial     OB History    Gravida  2   Para  2   Term  2   Preterm      AB      Living  1     SAB      TAB      Ectopic      Multiple      Live Births  1           Family History  Problem Relation Age of Onset  . Cerebral aneurysm Father 1  . Aortic aneurysm Father   . Diabetes Other   . Hypertension Other   . Brain cancer Other   . Breast cancer Neg Hx   . Colon cancer Neg Hx   . Esophageal cancer Neg Hx   . Stomach cancer Neg Hx   . Rectal cancer Neg Hx     Social History   Tobacco Use  . Smoking status: Never Smoker  . Smokeless tobacco: Never Used  Vaping Use  . Vaping Use: Never used  Substance Use Topics  . Alcohol use: Yes    Alcohol/week: 0.0 standard drinks    Comment: socially  . Drug use: No    Home Medications Prior to Admission medications   Medication Sig Start Date End  Date Taking? Authorizing Provider  BIKTARVY 50-200-25 MG TABS tablet TAKE 1 TABLET BY MOUTH DAILY. 11/25/19   Ginnie Smart, MD  clonazePAM (KLONOPIN) 0.5 MG tablet Take 1 tablet (0.5 mg total) by mouth daily as needed for anxiety. 11/15/19   Nafziger, Kandee Keen, NP  hydrochlorothiazide (HYDRODIURIL) 25 MG tablet Take 1 tablet (25 mg total) by mouth daily. 05/21/19   Nafziger, Kandee Keen, NP  meclizine (ANTIVERT) 25 MG tablet Take 1 tablet (25 mg total) by mouth 3 (three) times daily as needed for dizziness. Patient not taking: Reported on 11/15/2019 02/14/17   Shirline Frees, NP  ondansetron (ZOFRAN ODT) 4 MG disintegrating tablet Take 1 tablet (4 mg total) by mouth every 8 (eight) hours as needed for nausea or vomiting. Patient not taking: Reported on 11/15/2019 02/06/17   Ginnie Smart, MD    Allergies    Iodinated diagnostic agents and Sulfonamide derivatives  Review of Systems     Review of Systems  All other systems reviewed and are negative.   Physical Exam Updated Vital Signs BP (!) 174/103 (BP Location: Right Arm)   Pulse 75   Temp 98.2 F (36.8 C) (Oral)   Resp 17   SpO2 100%   Physical Exam Vitals and nursing note reviewed. Exam conducted with a chaperone present.  Constitutional:      General: She is not in acute distress.    Appearance: Normal appearance. She is not ill-appearing.     Comments: Smiling and engaged.  HENT:     Head: Normocephalic.     Comments: Mild hematoma and ecchymoses involving top of head. Mild nonbleeding skin abrasion noted. Eyes:     General: No scleral icterus.    Conjunctiva/sclera: Conjunctivae normal.  Neck:     Comments: No midline cervical TTP. Cardiovascular:     Rate and Rhythm: Normal rate and regular rhythm.     Pulses: Normal pulses.  Pulmonary:     Effort: Pulmonary effort is normal.  Musculoskeletal:        General: Normal range of motion.     Cervical back: Normal range of motion and neck supple. No rigidity.  Skin:    General: Skin is dry.     Capillary Refill: Capillary refill takes less than 2 seconds.  Neurological:     General: No focal deficit present.     Mental Status: She is alert and oriented to person, place, and time.     GCS: GCS eye subscore is 4. GCS verbal subscore is 5. GCS motor subscore is 6.     Cranial Nerves: No cranial nerve deficit.     Sensory: No sensory deficit.     Motor: No weakness.     Coordination: Coordination normal.     Gait: Gait normal.     Comments: Patient is alert and oriented x4. She is resting comfortably in no acute distress. No focal deficits on exam. Negative Romberg and cerebellar exams. Sensation intact throughout. Ambulates without ataxia or difficulty.  Psychiatric:        Mood and Affect: Mood normal.        Behavior: Behavior normal.        Thought Content: Thought content normal.     ED Results / Procedures / Treatments   Labs (all labs  ordered are listed, but only abnormal results are displayed) Labs Reviewed - No data to display  EKG None  Radiology CT Head Wo Contrast  Result Date: 05/13/2020 CLINICAL DATA:  Head trauma, moderate/severe. EXAM:  CT HEAD WITHOUT CONTRAST TECHNIQUE: Contiguous axial images were obtained from the base of the skull through the vertex without intravenous contrast. COMPARISON:  Head CT 03/31/2017. FINDINGS: Brain: Cerebral volume is normal. There is no acute intracranial hemorrhage. No demarcated cortical infarct. No extra-axial fluid collection. No evidence of intracranial mass. No midline shift. Redemonstrated partially empty sella turcica. Vascular: No hyperdense vessel. Skull: Normal. Negative for fracture or focal lesion. Sinuses/Orbits: Visualized orbits show no acute finding. No significant paranasal sinus disease or mastoid effusion at the imaged levels. IMPRESSION: No evidence of acute intracranial abnormality. Redemonstrated partially empty sella turcica. This is very commonly an incidental finding, but can be associated with idiopathic intracranial hypertension. Electronically Signed   By: Jackey LogeKyle  Golden DO   On: 05/13/2020 17:40    Procedures Procedures (including critical care time)  Medications Ordered in ED Medications - No data to display  ED Course  I have reviewed the triage vital signs and the nursing notes.  Pertinent labs & imaging results that were available during my care of the patient were reviewed by me and considered in my medical decision making (see chart for details).    MDM Rules/Calculators/A&P                          Patient with head injury which did not cause of loss of consciousness but with persistent headache since the initial trauma.  No evidence of skull fracture on physical exam. Patient is not taking anticoagulants, is less than 65 and has no history of subarachnoid or subdural hemorrhage. Patient denies nausea, vomiting, amnesia, vision  changes,cognitive or memory dysfunction and vertigo.  Patient with no focal neurological deficits on physical exam.  Discussed thoroughly symptoms to return to the emergency department including severe headaches, disequilibrium, vomiting, double vision, extremity weakness, difficulty ambulating, or any other concerning symptoms.  Discussed the likely etiology of patient's symptoms being concussive in nature.  Discussed the risk versus benefit of CT scan at this time I do not believe she warrants one. Patient agrees that CT is not indicated at this time.  Patient will be discharged with information pertaining to diagnosis and advised to use over-the-counter medications like NSAIDs and Tylenol for pain relief. Pt has also advised to not participate in contact sports until they are completely asymptomatic for at least 1 week or they are cleared by their doctor.   Given patient's loss of consciousness and concern of primary care provider for hemorrhage, CT head without contrast was obtained. I discussed the risks and benefits of CT with patient and she want to proceed with imaging.  CT is personally reviewed which demonstrates no acute intracranial abnormalities.  There is a partially empty sella turcica is an incidental finding and can be linked to idiopathic intracranial hypertension.  Notified patient.  She will follow up with her primary care provider.  Algernon Huxleyngela R Dewilde was evaluated in Emergency Department on 05/13/2020 for the symptoms described in the history of present illness. She was evaluated in the context of the global COVID-19 pandemic, which necessitated consideration that the patient might be at risk for infection with the SARS-CoV-2 virus that causes COVID-19. Institutional protocols and algorithms that pertain to the evaluation of patients at risk for COVID-19 are in a state of rapid change based on information released by regulatory bodies including the CDC and federal and state organizations. These  policies and algorithms were followed during the patient's care in the ED.  Final  Clinical Impression(s) / ED Diagnoses Final diagnoses:  Concussion with loss of consciousness of 30 minutes or less, initial encounter    Rx / DC Orders ED Discharge Orders    None       Lorelee New, PA-C 05/13/20 1801    Pollyann Savoy, MD 05/13/20 2022

## 2020-05-13 NOTE — Telephone Encounter (Signed)
She is not on blood thinners so unless she has symptoms a CT scan is not indicated. She will need to be seen first   - Kandee Keen

## 2020-05-13 NOTE — Telephone Encounter (Signed)
Left a message for the pt to return my call.  

## 2020-05-14 NOTE — Telephone Encounter (Signed)
Left a message for the pt to return my call.  

## 2020-05-14 NOTE — Telephone Encounter (Signed)
Patient called back and stated she was seen at [facility] ER yesterday and had a CT scan which was normal.  Message sent to PCP.

## 2020-05-15 ENCOUNTER — Telehealth: Payer: Self-pay | Admitting: Adult Health

## 2020-05-15 NOTE — Telephone Encounter (Signed)
Pt is calling with a follow up question on something the ER Dr. Darnelle Going on the CT scan  Please call pt [phone]

## 2020-05-20 NOTE — Telephone Encounter (Signed)
Attempted to call the patient to schedule an appointment, but the phone number is not a working number.

## 2020-05-21 ENCOUNTER — Encounter: Payer: BC Managed Care – PPO | Admitting: Adult Health

## 2020-05-27 DIAGNOSIS — H35712 Central serous chorioretinopathy, left eye: Secondary | ICD-10-CM | POA: Diagnosis not present

## 2020-05-28 ENCOUNTER — Other Ambulatory Visit: Payer: Self-pay

## 2020-05-28 ENCOUNTER — Ambulatory Visit (INDEPENDENT_AMBULATORY_CARE_PROVIDER_SITE_OTHER): Payer: BC Managed Care – PPO | Admitting: Adult Health

## 2020-05-28 ENCOUNTER — Encounter: Payer: Self-pay | Admitting: Adult Health

## 2020-05-28 VITALS — BP 128/82 | HR 65 | Temp 98.4°F | Ht 67.0 in | Wt 215.0 lb

## 2020-05-28 DIAGNOSIS — B20 Human immunodeficiency virus [HIV] disease: Secondary | ICD-10-CM | POA: Diagnosis not present

## 2020-05-28 DIAGNOSIS — Z0000 Encounter for general adult medical examination without abnormal findings: Secondary | ICD-10-CM

## 2020-05-28 DIAGNOSIS — I10 Essential (primary) hypertension: Secondary | ICD-10-CM

## 2020-05-28 DIAGNOSIS — F411 Generalized anxiety disorder: Secondary | ICD-10-CM | POA: Diagnosis not present

## 2020-05-28 MED ORDER — HYDROCHLOROTHIAZIDE 25 MG PO TABS: 25.0000 mg | ORAL_TABLET | Freq: Every day | ORAL | 3 refills | Status: DC

## 2020-05-28 NOTE — Progress Notes (Signed)
Subjective:    Patient ID: [name], female    DOB: 1965-11-27, 54 y.o.   MRN: [id number]  HPI Patient presents for yearly preventative medicine examination. She is a pleasant 54 year old female who  has a past medical history of Allergy, Anxiety, HIV (human immunodeficiency virus infection) ([place]), Hypertension, Insomnia, and Migraine.  Essential hypertension-currently prescribed HCTZ 25 mg.  She denies dizziness, lightheadedness, chest pain, or shortness of breath. BP Readings from Last 3 Encounters:  05/28/20 128/82  05/13/20 (!) 148/88  11/15/19 124/80   H/o HIV -currently followed by infectious disease.  Prescribed Biktarvy.  No side effects to the medication and tolerates well.  He does not miss doses of medication  Anxiety-situational in nature.  Takes Klonopin as needed  All immunizations and health maintenance protocols were reviewed with the patient and needed orders were placed. She will get her flu shot at work.   Appropriate screening laboratory values were ordered for the patient including screening of hyperlipidemia, renal function and hepatic function.  Medication reconciliation,  past medical history, social history, problem list and allergies were reviewed in detail with the patient  Goals were established with regard to weight loss, exercise, and  diet in compliance with medications  Wt Readings from Last 3 Encounters:  05/28/20 215 lb (97.5 kg)  11/15/19 213 lb (96.6 kg)  10/30/19 217 lb (98.4 kg)    Review of Systems  Constitutional: Negative.   HENT: Negative.   Eyes: Negative.   Respiratory: Negative.   Cardiovascular: Negative.   Gastrointestinal: Negative.   Endocrine: Negative.   Genitourinary: Negative.   Musculoskeletal: Negative.   Skin: Negative.   Allergic/Immunologic: Negative.   Neurological: Negative.   Hematological: Negative.   Psychiatric/Behavioral: Negative.    Past Medical History:  Diagnosis Date   Allergy    Anxiety     HIV (human immunodeficiency virus infection) ([place])    Hypertension    Insomnia    Migraine     Social History   Socioeconomic History   Marital status: Single    Spouse name: Not on file   Number of children: 1   Years of education: Not on file   Highest education level: Not on file  Occupational History   Occupation: Web designer    Employer: [employer]  Tobacco Use   Smoking status: Never Smoker   Smokeless tobacco: Never Used  Scientific laboratory technician Use: Never used  Substance and Sexual Activity   Alcohol use: Yes    Alcohol/week: 0.0 standard drinks    Comment: socially   Drug use: No   Sexual activity: Yes    Partners: Male    Birth control/protection: Abstinence, Surgical  Other Topics Concern   Not on file  Social History Narrative   She works at [employer] as an Web designer for [employer]    Divorced   One son who lives in [place]      She likes to shop and travel.    Social Determinants of Health   Financial Resource Strain:    Difficulty of Paying Living Expenses: Not on file  Food Insecurity:    Worried About Charity fundraiser in the Last Year: Not on file   [employer] of Food in the Last Year: Not on file  Transportation Needs:    Lack of Transportation (Medical): Not on file   Lack of Transportation (Non-Medical): Not on file  Physical Activity:    Days of Exercise  per Week: Not on file   Minutes of Exercise per Session: Not on file  Stress:    Feeling of Stress : Not on file  Social Connections:    Frequency of Communication with Friends and Family: Not on file   Frequency of Social Gatherings with Friends and Family: Not on file   Attends Religious Services: Not on file   Active Member of Clubs or Organizations: Not on file   Attends Archivist Meetings: Not on file   Marital Status: Not on file  Intimate Partner Violence:    Fear of Current or Ex-Partner: Not on file    Emotionally Abused: Not on file   Physically Abused: Not on file   Sexually Abused: Not on file    Past Surgical History:  Procedure Laterality Date   ABDOMINAL HYSTERECTOMY Right 2004   partial    Family History  Problem Relation Age of Onset   Cerebral aneurysm Father 38   Aortic aneurysm Father    Diabetes Other    Hypertension Other    Brain cancer Other    Breast cancer Neg Hx    Colon cancer Neg Hx    Esophageal cancer Neg Hx    Stomach cancer Neg Hx    Rectal cancer Neg Hx     Allergies  Allergen Reactions   Iodinated Diagnostic Agents Other (See Comments)    Pain in my eyes .    Sulfonamide Derivatives Hives    Current Outpatient Medications on File Prior to Visit  Medication Sig Dispense Refill   BIKTARVY 50-200-25 MG TABS tablet TAKE 1 TABLET BY MOUTH DAILY. 30 tablet 5   clonazePAM (KLONOPIN) 0.5 MG tablet Take 1 tablet (0.5 mg total) by mouth daily as needed for anxiety. 90 tablet 0   hydrochlorothiazide (HYDRODIURIL) 25 MG tablet Take 1 tablet (25 mg total) by mouth daily. 90 tablet 3   No current facility-administered medications on file prior to visit.    BP 128/82 (BP Location: Left Arm, Patient Position: Sitting, Cuff Size: Normal)    Pulse 65    Temp 98.4 F (36.9 C) (Oral)    Ht _0  (1.702 m)    Wt 215 lb (97.5 kg)    SpO2 98%    BMI 33.67 kg/m       Objective:   Physical Exam Vitals and nursing note reviewed.  Constitutional:      General: She is not in acute distress.    Appearance: Normal appearance. She is well-developed. She is not ill-appearing.  HENT:     Head: Normocephalic and atraumatic.     Right Ear: Tympanic membrane, ear canal and external ear normal. There is no impacted cerumen.     Left Ear: Tympanic membrane, ear canal and external ear normal. There is no impacted cerumen.     Nose: Nose normal. No congestion or rhinorrhea.     Mouth/Throat:     Mouth: Mucous membranes are moist.     Pharynx:  Oropharynx is clear. No oropharyngeal exudate or posterior oropharyngeal erythema.  Eyes:     General:        Right eye: No discharge.        Left eye: No discharge.     Extraocular Movements: Extraocular movements intact.     Conjunctiva/sclera: Conjunctivae normal.     Pupils: Pupils are equal, round, and reactive to light.  Neck:     Thyroid: No thyromegaly.     Vascular: No carotid bruit.  Trachea: No tracheal deviation.  Cardiovascular:     Rate and Rhythm: Normal rate and regular rhythm.     Pulses: Normal pulses.     Heart sounds: Normal heart sounds. No murmur heard.  No friction rub. No gallop.   Pulmonary:     Effort: Pulmonary effort is normal. No respiratory distress.     Breath sounds: Normal breath sounds. No stridor. No wheezing, rhonchi or rales.  Chest:     Chest wall: No tenderness.  Abdominal:     General: Abdomen is flat. Bowel sounds are normal. There is no distension.     Palpations: Abdomen is soft. There is no mass.     Tenderness: There is no abdominal tenderness. There is no right CVA tenderness, left CVA tenderness, guarding or rebound.     Hernia: No hernia is present.  Musculoskeletal:        General: No swelling, tenderness, deformity or signs of injury. Normal range of motion.     Cervical back: Normal range of motion and neck supple.     Right lower leg: No edema.     Left lower leg: No edema.  Lymphadenopathy:     Cervical: No cervical adenopathy.  Skin:    General: Skin is warm and dry.     Capillary Refill: Capillary refill takes less than 2 seconds.     Coloration: Skin is not jaundiced or pale.     Findings: No bruising, erythema, lesion or rash.  Neurological:     General: No focal deficit present.     Mental Status: She is alert and oriented to person, place, and time.     Cranial Nerves: No cranial nerve deficit.     Sensory: No sensory deficit.     Motor: No weakness.     Coordination: Coordination normal.     Gait: Gait  normal.     Deep Tendon Reflexes: Reflexes normal.  Psychiatric:        Mood and Affect: Mood normal.        Behavior: Behavior normal.        Thought Content: Thought content normal.        Judgment: Judgment normal.       Assessment & Plan:  1. Routine general medical examination at a health care facility - Encouraged weight loss through diet and exercise - Follow up in one year or sooner if needed - CBC with Differential/Platelet; Future - Hemoglobin A1c; Future - Lipid panel; Future - TSH; Future - CMP with eGFR(Quest); Future - CMP with eGFR(Quest) - TSH - Lipid panel - Hemoglobin A1c - CBC with Differential/Platelet  2. Essential hypertension - Continue with HCTZ  - CBC with Differential/Platelet; Future - Hemoglobin A1c; Future - Lipid panel; Future - TSH; Future - CMP with eGFR([facility]); Future - hydrochlorothiazide (HYDRODIURIL) 25 MG tablet; Take 1 tablet (25 mg total) by mouth daily.  Dispense: 90 tablet; Refill: 3 - CMP with eGFR(Quest) - TSH - Lipid panel - Hemoglobin A1c - CBC with Differential/Platelet  3. Human immunodeficiency virus (HIV) disease ([place]) - Follow up with ID as directed  4. Anxiety state - Continue with klonopin PRN

## 2020-05-29 ENCOUNTER — Other Ambulatory Visit: Payer: Self-pay | Admitting: Adult Health

## 2020-05-29 DIAGNOSIS — F411 Generalized anxiety disorder: Secondary | ICD-10-CM

## 2020-05-29 LAB — LIPID PANEL
Cholesterol: 211 mg/dL — ABNORMAL HIGH (ref <200)
HDL: 50 mg/dL (ref >=50)
LDL Cholesterol (Calc): 139 mg/dL (calc) — ABNORMAL HIGH
Non-HDL Cholesterol (Calc): 161 mg/dL (calc) — ABNORMAL HIGH (ref <130)
Total CHOL/HDL Ratio: 4.2 (calc) (ref <5.0)
Triglycerides: 102 mg/dL (ref <150)

## 2020-05-29 LAB — CBC WITH DIFFERENTIAL/PLATELET
Absolute Monocytes: 360 cells/uL (ref 200–950)
Basophils Absolute: 18 cells/uL (ref 0–200)
Basophils Relative: 0.4 %
Eosinophils Absolute: 41 cells/uL (ref 15–500)
Eosinophils Relative: 0.9 %
HCT: 42.4 % (ref 35.0–45.0)
Hemoglobin: 13.4 g/dL (ref 11.7–15.5)
Lymphs Abs: 1616 cells/uL (ref 850–3900)
MCH: 27.1 pg (ref 27.0–33.0)
MCHC: 31.6 g/dL — ABNORMAL LOW (ref 32.0–36.0)
MCV: 85.8 fL (ref 80.0–100.0)
MPV: 9.3 fL (ref 7.5–12.5)
Monocytes Relative: 8 %
Neutro Abs: 2466 cells/uL (ref 1500–7800)
Neutrophils Relative %: 54.8 %
Platelets: 365 10*3/uL (ref 140–400)
RBC: 4.94 10*6/uL (ref 3.80–5.10)
RDW: 14.3 % (ref 11.0–15.0)
Total Lymphocyte: 35.9 %
WBC: 4.5 10*3/uL (ref 3.8–10.8)

## 2020-05-29 LAB — COMPLETE METABOLIC PANEL WITH GFR
AG Ratio: 1.4 (calc) (ref 1.0–2.5)
ALT: 8 U/L (ref 6–29)
AST: 13 U/L (ref 10–35)
Albumin: 4.3 g/dL (ref 3.6–5.1)
Alkaline phosphatase (APISO): 108 U/L (ref 37–153)
BUN/Creatinine Ratio: 7 (calc) (ref 6–22)
BUN: 8 mg/dL (ref 7–25)
CO2: 31 mmol/L (ref 20–32)
Calcium: 9.6 mg/dL (ref 8.6–10.4)
Chloride: 100 mmol/L (ref 98–110)
Creat: 1.15 mg/dL — ABNORMAL HIGH (ref 0.50–1.05)
GFR, Est African American: 63 mL/min/{1.73_m2} (ref >=60)
GFR, Est Non African American: 54 mL/min/{1.73_m2} — ABNORMAL LOW (ref >=60)
Globulin: 3 g/dL (calc) (ref 1.9–3.7)
Glucose, Bld: 87 mg/dL (ref 65–99)
Potassium: 3.9 mmol/L (ref 3.5–5.3)
Sodium: 138 mmol/L (ref 135–146)
Total Bilirubin: 0.5 mg/dL (ref 0.2–1.2)
Total Protein: 7.3 g/dL (ref 6.1–8.1)

## 2020-05-29 LAB — HEMOGLOBIN A1C
Hgb A1c MFr Bld: 5.3 % of total Hgb (ref <5.7)
Mean Plasma Glucose: 105 (calc)
eAG (mmol/L): 5.8 (calc)

## 2020-05-29 LAB — TSH: TSH: 1.63 mIU/L

## 2020-05-29 MED ORDER — CLONAZEPAM 0.5 MG PO TABS: 0.5000 mg | ORAL_TABLET | Freq: Every day | ORAL | 0 refills | Status: DC | PRN

## 2020-06-09 ENCOUNTER — Other Ambulatory Visit: Payer: Self-pay | Admitting: Infectious Diseases

## 2020-06-09 DIAGNOSIS — B20 Human immunodeficiency virus [HIV] disease: Secondary | ICD-10-CM

## 2020-06-11 MED FILL — BIKTARVY 50-200-25 MG TABS: 50-200-25 | 30 days supply | Qty: 30 | Fill #0

## 2020-06-24 ENCOUNTER — Telehealth: Payer: Self-pay

## 2020-06-24 NOTE — Telephone Encounter (Signed)
My Chart [place] Message  

## 2020-07-13 MED FILL — BIKTARVY 50-200-25 MG TABS: 50-200-25 | 30 days supply | Qty: 30 | Fill #1

## 2020-08-13 MED FILL — BIKTARVY 50-200-25 MG TABS: 50-200-25 | 30 days supply | Qty: 30 | Fill #2

## 2020-08-15 ENCOUNTER — Other Ambulatory Visit: Payer: Self-pay | Admitting: Adult Health

## 2020-08-15 DIAGNOSIS — I10 Essential (primary) hypertension: Secondary | ICD-10-CM

## 2020-09-15 MED FILL — BIKTARVY 50-200-25 MG TABS: 50-200-25 | 30 days supply | Qty: 30 | Fill #3

## 2020-10-20 MED FILL — BIKTARVY 50-200-25 MG TABS: 50-200-25 | 30 days supply | Qty: 30 | Fill #4

## 2020-11-04 ENCOUNTER — Other Ambulatory Visit: Payer: Self-pay | Admitting: Obstetrics & Gynecology

## 2020-11-04 DIAGNOSIS — Z1231 Encounter for screening mammogram for malignant neoplasm of breast: Secondary | ICD-10-CM

## 2020-11-24 MED FILL — BIKTARVY 50-200-25 MG TABS: 50-200-25 | 30 days supply | Qty: 30 | Fill #5

## 2020-12-04 ENCOUNTER — Other Ambulatory Visit (HOSPITAL_COMMUNITY): Payer: Self-pay

## 2020-12-21 DIAGNOSIS — Z0000 Encounter for general adult medical examination without abnormal findings: Secondary | ICD-10-CM | POA: Diagnosis not present

## 2020-12-21 DIAGNOSIS — Z6834 Body mass index (BMI) 34.0-34.9, adult: Secondary | ICD-10-CM | POA: Diagnosis not present

## 2020-12-21 DIAGNOSIS — Z01419 Encounter for gynecological examination (general) (routine) without abnormal findings: Secondary | ICD-10-CM | POA: Diagnosis not present

## 2020-12-23 ENCOUNTER — Other Ambulatory Visit: Payer: Self-pay | Admitting: Infectious Diseases

## 2020-12-23 ENCOUNTER — Other Ambulatory Visit (HOSPITAL_COMMUNITY): Payer: Self-pay

## 2020-12-23 DIAGNOSIS — B20 Human immunodeficiency virus [HIV] disease: Secondary | ICD-10-CM

## 2020-12-25 ENCOUNTER — Other Ambulatory Visit (HOSPITAL_COMMUNITY): Payer: Self-pay

## 2020-12-25 MED ORDER — BIKTARVY 50-200-25 MG PO TABS
1.0000 | ORAL_TABLET | Freq: Every day | ORAL | 0 refills | Status: DC
  Filled 2020-12-25: qty 30, 30d supply, fill #0

## 2020-12-28 ENCOUNTER — Other Ambulatory Visit (HOSPITAL_COMMUNITY): Payer: Self-pay

## 2020-12-29 ENCOUNTER — Other Ambulatory Visit (HOSPITAL_COMMUNITY): Payer: Self-pay

## 2021-01-05 ENCOUNTER — Other Ambulatory Visit (HOSPITAL_COMMUNITY): Payer: Self-pay

## 2021-01-10 ENCOUNTER — Other Ambulatory Visit: Payer: Self-pay | Admitting: Adult Health

## 2021-01-10 DIAGNOSIS — F411 Generalized anxiety disorder: Secondary | ICD-10-CM

## 2021-01-25 ENCOUNTER — Other Ambulatory Visit (HOSPITAL_COMMUNITY): Payer: Self-pay

## 2021-01-25 ENCOUNTER — Other Ambulatory Visit: Payer: Self-pay | Admitting: Infectious Diseases

## 2021-01-25 DIAGNOSIS — B20 Human immunodeficiency virus [HIV] disease: Secondary | ICD-10-CM

## 2021-01-25 MED ORDER — BIKTARVY 50-200-25 MG PO TABS
1.0000 | ORAL_TABLET | Freq: Every day | ORAL | 0 refills | Status: DC
  Filled 2021-01-25: qty 30, 30d supply, fill #0

## 2021-01-26 ENCOUNTER — Other Ambulatory Visit (HOSPITAL_COMMUNITY): Payer: Self-pay

## 2021-01-28 ENCOUNTER — Other Ambulatory Visit (HOSPITAL_COMMUNITY): Payer: Self-pay

## 2021-02-11 ENCOUNTER — Encounter: Payer: Self-pay | Admitting: Infectious Diseases

## 2021-02-11 ENCOUNTER — Ambulatory Visit (INDEPENDENT_AMBULATORY_CARE_PROVIDER_SITE_OTHER): Payer: BC Managed Care – PPO | Admitting: Infectious Diseases

## 2021-02-11 ENCOUNTER — Other Ambulatory Visit: Payer: Self-pay

## 2021-02-11 ENCOUNTER — Other Ambulatory Visit (HOSPITAL_COMMUNITY): Payer: Self-pay

## 2021-02-11 VITALS — BP 135/99 | HR 81 | Wt 220.0 lb

## 2021-02-11 DIAGNOSIS — B20 Human immunodeficiency virus [HIV] disease: Secondary | ICD-10-CM

## 2021-02-11 DIAGNOSIS — E881 Lipodystrophy, not elsewhere classified: Secondary | ICD-10-CM | POA: Diagnosis not present

## 2021-02-11 DIAGNOSIS — I10 Essential (primary) hypertension: Secondary | ICD-10-CM | POA: Diagnosis not present

## 2021-02-11 DIAGNOSIS — F411 Generalized anxiety disorder: Secondary | ICD-10-CM | POA: Diagnosis not present

## 2021-02-11 MED ORDER — DOVATO 50-300 MG PO TABS
1.0000 | ORAL_TABLET | Freq: Every day | ORAL | 3 refills | Status: DC
Start: 2021-02-11 — End: 2022-03-02
  Filled 2021-02-11: qty 90, fill #0
  Filled 2021-02-25 (×2): qty 30, 30d supply, fill #0
  Filled 2021-03-23: qty 30, 30d supply, fill #1
  Filled 2021-04-28: qty 30, 30d supply, fill #2
  Filled 2021-06-01: qty 30, 30d supply, fill #3
  Filled 2021-06-24: qty 30, 30d supply, fill #4
  Filled 2021-07-28: qty 30, 30d supply, fill #5
  Filled 2021-08-04 – 2021-08-24 (×2): qty 30, 30d supply, fill #6
  Filled 2021-09-30: qty 30, 30d supply, fill #7
  Filled 2021-11-03: qty 30, 30d supply, fill #8
  Filled 2021-11-26: qty 30, 30d supply, fill #9
  Filled 2021-12-29: qty 30, 30d supply, fill #10
  Filled 2022-02-04: qty 30, 30d supply, fill #11
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
## 2021-02-11 NOTE — Assessment & Plan Note (Signed)
Will change her to [name].  Her health care maintaince is up to date.  Will see her back in 6 weeks to recheck her labs and see how [name] is going.  We also discussed [place]- this may also cause wt gain. She is tired of shots.

## 2021-02-11 NOTE — Assessment & Plan Note (Signed)
Will refer her to counseling (family services)

## 2021-02-11 NOTE — Assessment & Plan Note (Signed)
Will see if ART change helps.  She is exercising and intermittent fasting.

## 2021-02-11 NOTE — Addendum Note (Signed)
Addended byJimmy Picket F on: 02/11/2021 03:58 PM   Modules accepted: Orders

## 2021-02-11 NOTE — Addendum Note (Signed)
Addended by: Valarie Cones on: 02/11/2021 04:18 PM   Modules accepted: Orders

## 2021-02-11 NOTE — Assessment & Plan Note (Signed)
Moderately well controlled. May need to change her HCTZ- ACE-I Will f/u

## 2021-02-14 LAB — LIPID PANEL
Cholesterol: 197 mg/dL (ref <200)
HDL: 48 mg/dL — ABNORMAL LOW (ref >=50)
LDL Cholesterol (Calc): 127 mg/dL (calc) — ABNORMAL HIGH
Non-HDL Cholesterol (Calc): 149 mg/dL (calc) — ABNORMAL HIGH (ref <130)
Total CHOL/HDL Ratio: 4.1 (calc) (ref <5.0)
Triglycerides: 114 mg/dL (ref <150)

## 2021-02-14 LAB — CBC WITH DIFFERENTIAL/PLATELET
Absolute Monocytes: 342 cells/uL (ref 200–950)
Basophils Absolute: 41 cells/uL (ref 0–200)
Basophils Relative: 0.8 %
Eosinophils Absolute: 51 cells/uL (ref 15–500)
Eosinophils Relative: 1 %
HCT: 40.4 % (ref 35.0–45.0)
Hemoglobin: 13.3 g/dL (ref 11.7–15.5)
Lymphs Abs: 1836 cells/uL (ref 850–3900)
MCH: 28.2 pg (ref 27.0–33.0)
MCHC: 32.9 g/dL (ref 32.0–36.0)
MCV: 85.6 fL (ref 80.0–100.0)
MPV: 9.6 fL (ref 7.5–12.5)
Monocytes Relative: 6.7 %
Neutro Abs: 2831 cells/uL (ref 1500–7800)
Neutrophils Relative %: 55.5 %
Platelets: 327 10*3/uL (ref 140–400)
RBC: 4.72 10*6/uL (ref 3.80–5.10)
RDW: 13.8 % (ref 11.0–15.0)
Total Lymphocyte: 36 %
WBC: 5.1 10*3/uL (ref 3.8–10.8)

## 2021-02-14 LAB — COMPREHENSIVE METABOLIC PANEL
AG Ratio: 1.4 (calc) (ref 1.0–2.5)
ALT: 7 U/L (ref 6–29)
AST: 12 U/L (ref 10–35)
Albumin: 4.1 g/dL (ref 3.6–5.1)
Alkaline phosphatase (APISO): 84 U/L (ref 37–153)
BUN/Creatinine Ratio: 8 (calc) (ref 6–22)
BUN: 10 mg/dL (ref 7–25)
CO2: 30 mmol/L (ref 20–32)
Calcium: 9.6 mg/dL (ref 8.6–10.4)
Chloride: 103 mmol/L (ref 98–110)
Creat: 1.18 mg/dL — ABNORMAL HIGH (ref 0.50–1.05)
Globulin: 2.9 g/dL (calc) (ref 1.9–3.7)
Glucose, Bld: 78 mg/dL (ref 65–99)
Potassium: 3.9 mmol/L (ref 3.5–5.3)
Sodium: 141 mmol/L (ref 135–146)
Total Bilirubin: 0.3 mg/dL (ref 0.2–1.2)
Total Protein: 7 g/dL (ref 6.1–8.1)

## 2021-02-14 LAB — HIV-1 RNA QUANT-NO REFLEX-BLD
HIV 1 RNA Quant: NOT DETECTED Copies/mL
HIV-1 RNA Quant, Log: NOT DETECTED Log cps/mL

## 2021-02-14 LAB — T-HELPER CELLS (CD4) COUNT (NOT AT ARMC)
Absolute CD4: 976 cells/uL (ref 490–1740)
CD4 T Helper %: 54 % (ref 30–61)
Total lymphocyte count: 1797 cells/uL (ref 850–3900)

## 2021-02-24 ENCOUNTER — Other Ambulatory Visit (HOSPITAL_COMMUNITY): Payer: Self-pay

## 2021-02-25 ENCOUNTER — Other Ambulatory Visit (HOSPITAL_COMMUNITY): Payer: Self-pay

## 2021-02-25 ENCOUNTER — Telehealth: Payer: Self-pay

## 2021-02-25 NOTE — Telephone Encounter (Signed)
[facility] Patient Advocate Encounter   Was successful in obtaining a Viiv copay card for Dovato.  This copay card will make the patients copay $0.00.  I have spoken with the patient.    The billing information is as follows and has been shared with [facility].         Hartford Maulden, CPhT [facility] Phone: [phone] Fax:  [phone]  

## 2021-03-01 ENCOUNTER — Ambulatory Visit
Admission: RE | Admit: 2021-03-01 | Discharge: 2021-03-01 | Disposition: A | Discharge status: Home / Self Care | Payer: BC Managed Care – PPO | Source: Ambulatory Visit | Attending: Obstetrics & Gynecology | Admitting: Obstetrics & Gynecology

## 2021-03-01 ENCOUNTER — Other Ambulatory Visit: Payer: Self-pay

## 2021-03-01 DIAGNOSIS — Z1231 Encounter for screening mammogram for malignant neoplasm of breast: Secondary | ICD-10-CM | POA: Diagnosis not present

## 2021-03-23 ENCOUNTER — Other Ambulatory Visit (HOSPITAL_COMMUNITY): Payer: Self-pay

## 2021-03-29 ENCOUNTER — Other Ambulatory Visit (HOSPITAL_COMMUNITY): Payer: Self-pay

## 2021-04-01 ENCOUNTER — Other Ambulatory Visit (HOSPITAL_COMMUNITY): Payer: Self-pay

## 2021-04-02 ENCOUNTER — Ambulatory Visit: Payer: BC Managed Care – PPO | Admitting: Infectious Diseases

## 2021-04-16 ENCOUNTER — Ambulatory Visit (INDEPENDENT_AMBULATORY_CARE_PROVIDER_SITE_OTHER): Payer: BC Managed Care – PPO | Admitting: Infectious Diseases

## 2021-04-16 ENCOUNTER — Other Ambulatory Visit: Payer: Self-pay

## 2021-04-16 VITALS — BP 134/96 | HR 86 | Temp 97.3°F | Wt 220.0 lb

## 2021-04-16 DIAGNOSIS — E881 Lipodystrophy, not elsewhere classified: Secondary | ICD-10-CM | POA: Diagnosis not present

## 2021-04-16 DIAGNOSIS — Z113 Encounter for screening for infections with a predominantly sexual mode of transmission: Secondary | ICD-10-CM | POA: Diagnosis not present

## 2021-04-16 DIAGNOSIS — B20 Human immunodeficiency virus [HIV] disease: Secondary | ICD-10-CM | POA: Diagnosis not present

## 2021-04-16 DIAGNOSIS — I10 Essential (primary) hypertension: Secondary | ICD-10-CM | POA: Diagnosis not present

## 2021-04-16 DIAGNOSIS — Z79899 Other long term (current) drug therapy: Secondary | ICD-10-CM

## 2021-04-16 NOTE — Assessment & Plan Note (Signed)
Mildly elevated today Appreciate her PCP f/u.

## 2021-04-16 NOTE — Assessment & Plan Note (Signed)
Will see if this improves on new ART.  We discussed ASCVD, she does not want to start statin.
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
## 2021-04-16 NOTE — Assessment & Plan Note (Signed)
Appreciate her PCP f/u Will check her labs today, let her know if CD4 drops substantially or VL detectable.  See her back in 9 months, labs prior.  Vaccines up to date Flu vax when available.

## 2021-04-19 LAB — HIV-1 RNA QUANT-NO REFLEX-BLD
HIV 1 RNA Quant: NOT DETECTED Copies/mL
HIV-1 RNA Quant, Log: NOT DETECTED Log cps/mL

## 2021-04-19 LAB — T-HELPER CELLS (CD4) COUNT (NOT AT ARMC)
Absolute CD4: 778 cells/uL (ref 490–1740)
CD4 T Helper %: 45 % (ref 30–61)
Total lymphocyte count: 1735 cells/uL (ref 850–3900)

## 2021-04-27 ENCOUNTER — Other Ambulatory Visit (HOSPITAL_COMMUNITY): Payer: Self-pay

## 2021-04-28 ENCOUNTER — Other Ambulatory Visit (HOSPITAL_COMMUNITY): Payer: Self-pay

## 2021-05-04 ENCOUNTER — Other Ambulatory Visit (HOSPITAL_COMMUNITY): Payer: Self-pay

## 2021-05-31 ENCOUNTER — Other Ambulatory Visit (HOSPITAL_COMMUNITY): Payer: Self-pay

## 2021-06-01 ENCOUNTER — Other Ambulatory Visit (HOSPITAL_COMMUNITY): Payer: Self-pay

## 2021-06-24 ENCOUNTER — Other Ambulatory Visit (HOSPITAL_COMMUNITY): Payer: Self-pay

## 2021-06-25 ENCOUNTER — Other Ambulatory Visit (HOSPITAL_COMMUNITY): Payer: Self-pay

## 2021-06-28 ENCOUNTER — Other Ambulatory Visit (HOSPITAL_COMMUNITY): Payer: Self-pay

## 2021-06-30 ENCOUNTER — Other Ambulatory Visit (HOSPITAL_COMMUNITY): Payer: Self-pay

## 2021-07-28 ENCOUNTER — Other Ambulatory Visit (HOSPITAL_COMMUNITY): Payer: Self-pay

## 2021-08-02 ENCOUNTER — Other Ambulatory Visit (HOSPITAL_COMMUNITY): Payer: Self-pay

## 2021-08-04 ENCOUNTER — Other Ambulatory Visit (HOSPITAL_COMMUNITY): Payer: Self-pay

## 2021-08-18 ENCOUNTER — Other Ambulatory Visit: Payer: Self-pay | Admitting: Adult Health

## 2021-08-18 DIAGNOSIS — I10 Essential (primary) hypertension: Secondary | ICD-10-CM

## 2021-08-23 ENCOUNTER — Other Ambulatory Visit (HOSPITAL_COMMUNITY): Payer: Self-pay

## 2021-08-24 ENCOUNTER — Other Ambulatory Visit (HOSPITAL_COMMUNITY): Payer: Self-pay

## 2021-09-01 ENCOUNTER — Other Ambulatory Visit (HOSPITAL_COMMUNITY): Payer: Self-pay

## 2021-09-17 ENCOUNTER — Other Ambulatory Visit: Payer: Self-pay | Admitting: Adult Health

## 2021-09-17 DIAGNOSIS — I10 Essential (primary) hypertension: Secondary | ICD-10-CM

## 2021-09-21 NOTE — Telephone Encounter (Signed)
Patient need to schedule a CPE for more refills. 

## 2021-09-28 ENCOUNTER — Other Ambulatory Visit (HOSPITAL_COMMUNITY): Payer: Self-pay

## 2021-09-29 ENCOUNTER — Ambulatory Visit (INDEPENDENT_AMBULATORY_CARE_PROVIDER_SITE_OTHER): Payer: BC Managed Care – PPO | Admitting: Adult Health

## 2021-09-29 VITALS — BP 122/82 | HR 66 | Temp 97.6°F | Ht 68.0 in | Wt 226.2 lb

## 2021-09-29 DIAGNOSIS — I10 Essential (primary) hypertension: Secondary | ICD-10-CM | POA: Diagnosis not present

## 2021-09-29 DIAGNOSIS — F411 Generalized anxiety disorder: Secondary | ICD-10-CM

## 2021-09-29 DIAGNOSIS — Z0000 Encounter for general adult medical examination without abnormal findings: Secondary | ICD-10-CM

## 2021-09-29 DIAGNOSIS — N289 Disorder of kidney and ureter, unspecified: Secondary | ICD-10-CM

## 2021-09-29 DIAGNOSIS — B20 Human immunodeficiency virus [HIV] disease: Secondary | ICD-10-CM

## 2021-09-29 LAB — TSH: TSH: 1.12 u[IU]/mL (ref 0.35–5.50)

## 2021-09-29 LAB — CBC WITH DIFFERENTIAL/PLATELET
Basophils Absolute: 0 10*3/uL (ref 0.0–0.1)
Basophils Relative: 0.4 % (ref 0.0–3.0)
Eosinophils Absolute: 0.1 10*3/uL (ref 0.0–0.7)
Eosinophils Relative: 1.4 % (ref 0.0–5.0)
HCT: 40.8 % (ref 36.0–46.0)
Hemoglobin: 13.1 g/dL (ref 12.0–15.0)
Lymphocytes Relative: 34.2 % (ref 12.0–46.0)
Lymphs Abs: 1.6 10*3/uL (ref 0.7–4.0)
MCHC: 32.2 g/dL (ref 30.0–36.0)
MCV: 88.3 fl (ref 78.0–100.0)
Monocytes Absolute: 0.4 10*3/uL (ref 0.1–1.0)
Monocytes Relative: 7.5 % (ref 3.0–12.0)
Neutro Abs: 2.7 10*3/uL (ref 1.4–7.7)
Neutrophils Relative %: 56.5 % (ref 43.0–77.0)
Platelets: 326 10*3/uL (ref 150.0–400.0)
RBC: 4.62 Mil/uL (ref 3.87–5.11)
RDW: 14.7 % (ref 11.5–15.5)
WBC: 4.7 10*3/uL (ref 4.0–10.5)

## 2021-09-29 LAB — LIPID PANEL
Cholesterol: 189 mg/dL (ref 0–200)
HDL: 53.9 mg/dL (ref >39.00)
LDL Cholesterol: 118 mg/dL — ABNORMAL HIGH (ref 0–99)
NonHDL: 135.06
Total CHOL/HDL Ratio: 4
Triglycerides: 86 mg/dL (ref 0.0–149.0)
VLDL: 17.2 mg/dL (ref 0.0–40.0)

## 2021-09-29 LAB — COMPREHENSIVE METABOLIC PANEL
ALT: 10 U/L (ref 0–35)
AST: 14 U/L (ref 0–37)
Albumin: 4.2 g/dL (ref 3.5–5.2)
Alkaline Phosphatase: 86 U/L (ref 39–117)
BUN: 16 mg/dL (ref 6–23)
CO2: 33 mEq/L — ABNORMAL HIGH (ref 19–32)
Calcium: 9.6 mg/dL (ref 8.4–10.5)
Chloride: 102 mEq/L (ref 96–112)
Creatinine, Ser: 1.28 mg/dL — ABNORMAL HIGH (ref 0.40–1.20)
GFR: 47.24 mL/min — ABNORMAL LOW (ref >60.00)
Glucose, Bld: 84 mg/dL (ref 70–99)
Potassium: 3.7 mEq/L (ref 3.5–5.1)
Sodium: 141 mEq/L (ref 135–145)
Total Bilirubin: 0.4 mg/dL (ref 0.2–1.2)
Total Protein: 7.7 g/dL (ref 6.0–8.3)

## 2021-09-29 LAB — HEMOGLOBIN A1C: Hgb A1c MFr Bld: 5.5 % (ref 4.6–6.5)

## 2021-09-29 MED ORDER — HYDROCHLOROTHIAZIDE 25 MG PO TABS: ORAL_TABLET | ORAL | 3 refills | Status: DC

## 2021-09-29 MED ORDER — CLONAZEPAM 0.5 MG PO TABS: 0.5000 mg | ORAL_TABLET | Freq: Every day | ORAL | 2 refills | Status: DC | PRN

## 2021-09-29 NOTE — Progress Notes (Signed)
Subjective:    Patient ID: [name], female    DOB: [birth year], 56 y.o.   MRN: [id number]  HPI  Patient presents for yearly preventative medicine examination. She is a pleasant 56 year old female who  has a past medical history of Allergy, Anxiety, HIV (human immunodeficiency virus infection) (HCC), Hypertension, Insomnia, and Migraine.  Hypertension-is currently prescribed HCTZ 25 mg daily.  She denies dizziness, lightheadedness, chest pain, shortness of breath  BP Readings from Last 3 Encounters:  09/29/21 122/82  04/16/21 (!) 134/96  02/11/21 (!) 135/99    HIV disease-followed by infectious disease.  Currently managed with Dovato 50-300 mg  Anxiety-situational in nature.  Takes Klonopin as needed  All immunizations and health maintenance protocols were reviewed with the patient and needed orders were placed.  Appropriate screening laboratory values were ordered for the patient including screening of hyperlipidemia, renal function and hepatic function.  Medication reconciliation,  past medical history, social history, problem list and allergies were reviewed in detail with the patient  Goals were established with regard to weight loss, exercise, and  diet in compliance with medications Wt Readings from Last 3 Encounters:  09/29/21 226 lb 3.2 oz (102.6 kg)  04/16/21 220 lb (99.8 kg)  02/11/21 220 lb (99.8 kg)   She is up-to-date on routine colon cancer screening, Pap, mammogram  She has no acute complaints today   Review of Systems  Constitutional: Negative.   HENT: Negative.    Eyes: Negative.   Respiratory: Negative.    Cardiovascular: Negative.   Gastrointestinal: Negative.   Endocrine: Negative.   Genitourinary: Negative.   Musculoskeletal: Negative.   Skin: Negative.   Allergic/Immunologic: Negative.   Neurological: Negative.   Hematological: Negative.   Psychiatric/Behavioral: Negative.     Past Medical History:  Diagnosis Date   Allergy     Anxiety    HIV (human immunodeficiency virus infection) (HCC)    Hypertension    Insomnia    Migraine     Social History   Socioeconomic History   Marital status: Single    Spouse name: Not on file   Number of children: 1   Years of education: Not on file   Highest education level: Not on file  Occupational History   Occupation: Environmental health practitioner    Employer: [employer]  Tobacco Use   Smoking status: Never   Smokeless tobacco: Never  Vaping Use   Vaping Use: Never used  Substance and Sexual Activity   Alcohol use: Yes    Alcohol/week: 0.0 standard drinks    Comment: socially   Drug use: No   Sexual activity: Yes    Partners: Male    Birth control/protection: Abstinence, Surgical  Other Topics Concern   Not on file  Social History Narrative   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    Social Determinants of Health   Financial Resource Strain: Not on file  Food Insecurity: Not on file  Transportation Needs: Not on file  Physical Activity: Not on file  Stress: Not on file  Social Connections: Not on file  Intimate Partner Violence: Not on file    Past Surgical History:  Procedure Laterality Date   ABDOMINAL HYSTERECTOMY Right 2004   partial    Family History  Problem Relation Age of Onset   Cerebral aneurysm Father 29  Aortic aneurysm Father    Diabetes Other    Hypertension Other    Brain cancer Other    Breast cancer Neg Hx    Colon cancer Neg Hx    Esophageal cancer Neg Hx    Stomach cancer Neg Hx    Rectal cancer Neg Hx     Allergies  Allergen Reactions   Iodinated Contrast Media Other (See Comments)    Pain in my eyes .    Sulfonamide Derivatives Hives    Current Outpatient Medications on File Prior to Visit  Medication Sig Dispense Refill   cholecalciferol (VITAMIN D3) 25 MCG (1000 UNIT) tablet Take 1,000 Units by mouth  daily.     dolutegravir-lamiVUDine (DOVATO) 50-300 MG tablet Take 1 tablet by mouth daily. 90 tablet 3   hydrochlorothiazide (HYDRODIURIL) 25 MG tablet TAKE 1 TABLET BY MOUTH ONCE DAILY (PATIENT  NEEDS  PHYSICAL) 30 tablet 0   clonazePAM (KLONOPIN) 0.5 MG tablet TAKE 1 TABLET BY MOUTH ONCE DAILY AS NEEDED FOR ANXIETY 30 tablet 2   No current facility-administered medications on file prior to visit.    BP 122/82 (BP Location: Left Arm, Patient Position: Sitting, Cuff Size: Normal)    Pulse 66    Temp 97.6 F (36.4 C) (Oral)    Ht 5\' 8"  (1.727 m)    Wt 226 lb 3.2 oz (102.6 kg)    SpO2 100%    BMI 34.39 kg/m        Objective:   Physical Exam Vitals and nursing note reviewed.  Constitutional:      General: She is not in acute distress.    Appearance: Normal appearance. She is well-developed. She is not ill-appearing.  HENT:     Head: Normocephalic and atraumatic.     Right Ear: Tympanic membrane, ear canal and external ear normal. There is no impacted cerumen.     Left Ear: Tympanic membrane, ear canal and external ear normal. There is no impacted cerumen.     Nose: Nose normal. No congestion or rhinorrhea.     Mouth/Throat:     Mouth: Mucous membranes are moist.     Pharynx: Oropharynx is clear. No oropharyngeal exudate or posterior oropharyngeal erythema.  Eyes:     General:        Right eye: No discharge.        Left eye: No discharge.     Extraocular Movements: Extraocular movements intact.     Conjunctiva/sclera: Conjunctivae normal.     Pupils: Pupils are equal, round, and reactive to light.  Neck:     Thyroid: No thyromegaly.     Vascular: No carotid bruit.     Trachea: No tracheal deviation.  Cardiovascular:     Rate and Rhythm: Normal rate and regular rhythm.     Pulses: Normal pulses.     Heart sounds: Normal heart sounds. No murmur heard.   No friction rub. No gallop.  Pulmonary:     Effort: Pulmonary effort is normal. No respiratory distress.     Breath sounds:  Normal breath sounds. No stridor. No wheezing, rhonchi or rales.  Chest:     Chest wall: No tenderness.  Abdominal:     General: Abdomen is flat. Bowel sounds are normal. There is no distension.     Palpations: Abdomen is soft. There is no mass.     Tenderness: There is no abdominal tenderness. There is no right CVA tenderness, left CVA tenderness, guarding or rebound.     Hernia:  No hernia is present.  Musculoskeletal:        General: No swelling, tenderness, deformity or signs of injury. Normal range of motion.     Cervical back: Normal range of motion and neck supple.     Right lower leg: No edema.     Left lower leg: No edema.  Lymphadenopathy:     Cervical: No cervical adenopathy.  Skin:    General: Skin is warm and dry.     Capillary Refill: Capillary refill takes less than 2 seconds.     Coloration: Skin is not jaundiced or pale.     Findings: No bruising, erythema, lesion or rash.  Neurological:     General: No focal deficit present.     Mental Status: She is alert and oriented to person, place, and time.     Cranial Nerves: No cranial nerve deficit.     Sensory: No sensory deficit.     Motor: No weakness.     Coordination: Coordination normal.     Gait: Gait normal.     Deep Tendon Reflexes: Reflexes normal.  Psychiatric:        Mood and Affect: Mood normal.        Behavior: Behavior normal.        Thought Content: Thought content normal.        Judgment: Judgment normal.       Assessment & Plan:  1. Routine general medical examination at a health care facility - Follow up in one year or sooner if needed - CBC with Differential/Platelet; Future - Comprehensive metabolic panel; Future - Lipid panel; Future - TSH; Future - Hemoglobin A1c; Future - Hemoglobin A1c - TSH - Lipid panel - Comprehensive metabolic panel - CBC with Differential/Platelet  2. Essential hypertension - Well controlled.  - No change in medications  - CBC with Differential/Platelet;  Future - Comprehensive metabolic panel; Future - Lipid panel; Future - TSH; Future - Hemoglobin A1c; Future - hydrochlorothiazide (HYDRODIURIL) 25 MG tablet; TAKE 1 TABLET BY MOUTH ONCE DAILY (PATIENT  NEEDS  PHYSICAL)  Dispense: 90 tablet; Refill: 3 - Hemoglobin A1c - TSH - Lipid panel - Comprehensive metabolic panel - CBC with Differential/Platelet  3. Human immunodeficiency virus (HIV) disease (HCC) - Follow up with ID as directed  4. Anxiety state  - clonazePAM (KLONOPIN) 0.5 MG tablet; Take 1 tablet (0.5 mg total) by mouth daily as needed for anxiety.  Dispense: 30 tablet; Refill: 2  Shirline Frees, NP

## 2021-09-29 NOTE — Patient Instructions (Signed)
It was great seeing you today   We will follow up with you regarding your lab work   Please let me know if you need anything   

## 2021-09-30 ENCOUNTER — Other Ambulatory Visit (HOSPITAL_COMMUNITY): Payer: Self-pay

## 2021-10-06 ENCOUNTER — Other Ambulatory Visit (HOSPITAL_COMMUNITY): Payer: Self-pay

## 2021-10-06 ENCOUNTER — Other Ambulatory Visit: Payer: Self-pay | Admitting: Adult Health

## 2021-10-06 DIAGNOSIS — Z1231 Encounter for screening mammogram for malignant neoplasm of breast: Secondary | ICD-10-CM

## 2021-10-06 NOTE — Addendum Note (Signed)
Addended by: Waymon Amato R on: 10/06/2021 03:52 PM   Modules accepted: Orders

## 2021-10-07 ENCOUNTER — Ambulatory Visit: Payer: BC Managed Care – PPO | Admitting: Adult Health

## 2021-10-07 ENCOUNTER — Encounter: Payer: Self-pay | Admitting: Adult Health

## 2021-10-07 VITALS — BP 120/88 | HR 93 | Temp 98.0°F | Ht 68.0 in | Wt 226.0 lb

## 2021-10-07 DIAGNOSIS — N289 Disorder of kidney and ureter, unspecified: Secondary | ICD-10-CM | POA: Diagnosis not present

## 2021-10-07 DIAGNOSIS — M79605 Pain in left leg: Secondary | ICD-10-CM

## 2021-10-07 DIAGNOSIS — M5432 Sciatica, left side: Secondary | ICD-10-CM | POA: Diagnosis not present

## 2021-10-07 LAB — BASIC METABOLIC PANEL
BUN: 9 mg/dL (ref 6–23)
CO2: 33 mEq/L — ABNORMAL HIGH (ref 19–32)
Calcium: 9.2 mg/dL (ref 8.4–10.5)
Chloride: 101 mEq/L (ref 96–112)
Creatinine, Ser: 1.21 mg/dL — ABNORMAL HIGH (ref 0.40–1.20)
GFR: 50.53 mL/min — ABNORMAL LOW (ref >60.00)
Glucose, Bld: 89 mg/dL (ref 70–99)
Potassium: 3.3 mEq/L — ABNORMAL LOW (ref 3.5–5.1)
Sodium: 139 mEq/L (ref 135–145)

## 2021-10-07 NOTE — Progress Notes (Signed)
Subjective:    Patient ID: [name], female    DOB: 1966/07/08, 56 y.o.   MRN: [id number]  HPI  56 year old female who  has a past medical history of Allergy, Anxiety, HIV (human immunodeficiency virus infection) (HCC), Hypertension, Insomnia, and Migraine.  She presents to the office today for an acute issue. She reports two days ago she had a acute onset of left calf pain.  Reports that she was walking out the door and her calf became tight and painful.  Throughout the day the pain and tightness became better with exercise, elevation, and rest.  The next day she had some minor numbness and tingling that radiated down the outside of her left leg, this has also nearly resolved.  Review of Systems See HPI   Past Medical History:  Diagnosis Date   Allergy    Anxiety    HIV (human immunodeficiency virus infection) (HCC)    Hypertension    Insomnia    Migraine     Social History   Socioeconomic History   Marital status: Single    Spouse name: Not on file   Number of children: 1   Years of education: Not on file   Highest education level: Not on file  Occupational History   Occupation: Environmental health practitioner    Employer: [employer]  Tobacco Use   Smoking status: Never   Smokeless tobacco: Never  Vaping Use   Vaping Use: Never used  Substance and Sexual Activity   Alcohol use: Yes    Alcohol/week: 0.0 standard drinks    Comment: socially   Drug use: No   Sexual activity: Yes    Partners: Male    Birth control/protection: Abstinence, Surgical  Other Topics Concern   Not on file  Social History Narrative   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    Social Determinants of Health   Financial Resource Strain: Not on file  Food Insecurity: Not on file  Transportation Needs: Not on file  Physical Activity: Not on file  Stress: Not on file  Social  Connections: Not on file  Intimate Partner Violence: Not on file    Past Surgical History:  Procedure Laterality Date   ABDOMINAL HYSTERECTOMY Right 2004   partial    Family History  Problem Relation Age of Onset   Cerebral aneurysm Father 63   Aortic aneurysm Father    Diabetes Other    Hypertension Other    Brain cancer Other    Breast cancer Neg Hx    Colon cancer Neg Hx    Esophageal cancer Neg Hx    Stomach cancer Neg Hx    Rectal cancer Neg Hx     Allergies  Allergen Reactions   Iodinated Contrast Media Other (See Comments)    Pain in my eyes .    Sulfonamide Derivatives Hives    Current Outpatient Medications on File Prior to Visit  Medication Sig Dispense Refill   cholecalciferol (VITAMIN D3) 25 MCG (1000 UNIT) tablet Take 1,000 Units by mouth daily.     clonazePAM (KLONOPIN) 0.5 MG tablet Take 1 tablet (0.5 mg total) by mouth daily as needed for anxiety. 30 tablet 2   dolutegravir-lamiVUDine (DOVATO) 50-300 MG tablet Take 1 tablet by mouth daily. 90 tablet 3   hydrochlorothiazide (HYDRODIURIL) 25 MG tablet  TAKE 1 TABLET BY MOUTH ONCE DAILY (PATIENT  NEEDS  PHYSICAL) 90 tablet 3   Probiotic Product (PROBIOTIC BLEND PO) Take 1 tablet by mouth.     No current facility-administered medications on file prior to visit.    BP 120/88    Pulse 93    Temp 98 F (36.7 C) (Oral)    Ht 5\' 8"  (1.727 m)    Wt 226 lb (102.5 kg)    SpO2 100%    BMI 34.36 kg/m       Objective:   Physical Exam Vitals and nursing note reviewed.  Constitutional:      Appearance: Normal appearance.  Musculoskeletal:        General: No swelling or tenderness.     Comments: No signs of DVT in her left leg.  No tenderness with palpation to the calf.  She does have a simple cyst noted on her left calf.  Skin:    General: Skin is warm and dry.  Neurological:     General: No focal deficit present.     Mental Status: She is alert and oriented to person, place, and time.  Psychiatric:         Mood and Affect: Mood normal.        Behavior: Behavior normal.        Thought Content: Thought content normal.        Judgment: Judgment normal.      Assessment & Plan:  Calf pain seems to be muscular in origin and has also resolved.  Also appears that she had some sciatica that is nearly resolved as well.  Advised stretching exercises, can take Motrin as needed, use heating pad on lower back.  Follow-up as needed  , NP

## 2021-10-12 ENCOUNTER — Other Ambulatory Visit: Payer: BC Managed Care – PPO

## 2021-10-18 ENCOUNTER — Other Ambulatory Visit: Payer: BC Managed Care – PPO

## 2021-10-27 ENCOUNTER — Other Ambulatory Visit (HOSPITAL_COMMUNITY): Payer: Self-pay

## 2021-11-03 ENCOUNTER — Other Ambulatory Visit (HOSPITAL_COMMUNITY): Payer: Self-pay

## 2021-11-26 ENCOUNTER — Other Ambulatory Visit (HOSPITAL_COMMUNITY): Payer: Self-pay

## 2021-12-02 ENCOUNTER — Other Ambulatory Visit (HOSPITAL_COMMUNITY): Payer: Self-pay

## 2021-12-27 ENCOUNTER — Other Ambulatory Visit (HOSPITAL_COMMUNITY): Payer: Self-pay

## 2021-12-29 ENCOUNTER — Other Ambulatory Visit (HOSPITAL_COMMUNITY): Payer: Self-pay

## 2022-01-05 ENCOUNTER — Other Ambulatory Visit (HOSPITAL_COMMUNITY): Payer: Self-pay

## 2022-01-14 ENCOUNTER — Encounter: Payer: Self-pay | Admitting: Infectious Diseases

## 2022-01-14 ENCOUNTER — Ambulatory Visit: Payer: BC Managed Care – PPO | Admitting: Infectious Diseases

## 2022-01-14 ENCOUNTER — Other Ambulatory Visit: Payer: Self-pay

## 2022-01-14 VITALS — BP 125/90 | HR 85 | Temp 98.1°F | Wt 230.0 lb

## 2022-01-14 DIAGNOSIS — B20 Human immunodeficiency virus [HIV] disease: Secondary | ICD-10-CM | POA: Diagnosis not present

## 2022-01-14 DIAGNOSIS — Z113 Encounter for screening for infections with a predominantly sexual mode of transmission: Secondary | ICD-10-CM

## 2022-01-14 DIAGNOSIS — E669 Obesity, unspecified: Secondary | ICD-10-CM | POA: Insufficient documentation

## 2022-01-14 DIAGNOSIS — Z79899 Other long term (current) drug therapy: Secondary | ICD-10-CM | POA: Diagnosis not present

## 2022-01-14 DIAGNOSIS — I10 Essential (primary) hypertension: Secondary | ICD-10-CM | POA: Diagnosis not present

## 2022-01-14 NOTE — Assessment & Plan Note (Signed)
She is doing well on dovato ?Her wt has not changed ?Will offer shingles vax.  ?Concern about her Cr- suspect due to Cr and dolutegravir interacting.  ?Will recheck today.  ?Will see her back in 9 months ?Has had shingrix.  ?

## 2022-01-14 NOTE — Assessment & Plan Note (Signed)
No change with change in ART ?Will refer her to ACTG obesity study.  ?
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
## 2022-01-14 NOTE — Assessment & Plan Note (Signed)
She is well controlled today ?Greatly appreciate her PCP ?

## 2022-01-18 LAB — T-HELPER CELLS (CD4) COUNT (NOT AT ARMC)
Absolute CD4: 1034 cells/uL (ref 490–1740)
CD4 T Helper %: 50 % (ref 30–61)
Total lymphocyte count: 2090 cells/uL (ref 850–3900)

## 2022-01-18 LAB — BASIC METABOLIC PANEL
BUN/Creatinine Ratio: 10 (calc) (ref 6–22)
BUN: 13 mg/dL (ref 7–25)
CO2: 30 mmol/L (ref 20–32)
Calcium: 9.5 mg/dL (ref 8.6–10.4)
Chloride: 102 mmol/L (ref 98–110)
Creat: 1.3 mg/dL — ABNORMAL HIGH (ref 0.50–1.03)
Glucose, Bld: 82 mg/dL (ref 65–99)
Potassium: 4 mmol/L (ref 3.5–5.3)
Sodium: 140 mmol/L (ref 135–146)

## 2022-01-18 LAB — RPR: RPR Ser Ql: NONREACTIVE

## 2022-01-18 LAB — HIV-1 RNA QUANT-NO REFLEX-BLD
HIV 1 RNA Quant: NOT DETECTED copies/mL
HIV-1 RNA Quant, Log: NOT DETECTED Log copies/mL

## 2022-01-20 ENCOUNTER — Other Ambulatory Visit (HOSPITAL_COMMUNITY): Payer: Self-pay

## 2022-01-21 ENCOUNTER — Other Ambulatory Visit (HOSPITAL_COMMUNITY): Payer: Self-pay

## 2022-02-01 ENCOUNTER — Other Ambulatory Visit (HOSPITAL_COMMUNITY): Payer: Self-pay

## 2022-02-03 DIAGNOSIS — Z01419 Encounter for gynecological examination (general) (routine) without abnormal findings: Secondary | ICD-10-CM | POA: Diagnosis not present

## 2022-02-04 ENCOUNTER — Other Ambulatory Visit (HOSPITAL_COMMUNITY): Payer: Self-pay

## 2022-02-06 ENCOUNTER — Encounter: Payer: Self-pay | Admitting: Infectious Diseases

## 2022-02-08 ENCOUNTER — Other Ambulatory Visit (HOSPITAL_COMMUNITY): Payer: Self-pay

## 2022-03-01 ENCOUNTER — Other Ambulatory Visit: Payer: Self-pay | Admitting: Infectious Diseases

## 2022-03-01 ENCOUNTER — Other Ambulatory Visit (HOSPITAL_COMMUNITY): Payer: Self-pay

## 2022-03-01 DIAGNOSIS — B20 Human immunodeficiency virus [HIV] disease: Secondary | ICD-10-CM

## 2022-03-01 NOTE — Telephone Encounter (Signed)
Attempted to contact patient to schedule appt with new provider [name] patient) unable to reach.

## 2022-03-02 ENCOUNTER — Other Ambulatory Visit: Payer: Self-pay

## 2022-03-02 ENCOUNTER — Ambulatory Visit
Admission: RE | Admit: 2022-03-02 | Discharge: 2022-03-02 | Disposition: A | Discharge status: Home / Self Care | Payer: BC Managed Care – PPO | Source: Ambulatory Visit | Attending: Adult Health | Admitting: Adult Health

## 2022-03-02 ENCOUNTER — Other Ambulatory Visit (HOSPITAL_COMMUNITY): Payer: Self-pay

## 2022-03-02 DIAGNOSIS — B20 Human immunodeficiency virus [HIV] disease: Secondary | ICD-10-CM

## 2022-03-02 DIAGNOSIS — Z1231 Encounter for screening mammogram for malignant neoplasm of breast: Secondary | ICD-10-CM

## 2022-03-02 MED ORDER — DOVATO 50-300 MG PO TABS
1.0000 | ORAL_TABLET | Freq: Every day | ORAL | 11 refills | Status: DC
  Filled 2022-03-02: qty 30, 30d supply, fill #0
  Filled 2022-03-29: qty 30, 30d supply, fill #1
  Filled 2022-05-05: qty 30, 30d supply, fill #2
  Filled 2022-06-22: qty 30, 30d supply, fill #3
  Filled 2022-07-21: qty 30, 30d supply, fill #4
  Filled 2022-08-19: qty 30, 30d supply, fill #5
  Filled 2022-09-23: qty 30, 30d supply, fill #6
  Filled 2022-10-20: qty 30, 30d supply, fill #7
  Filled 2022-11-24: qty 30, 30d supply, fill #8
  Filled 2022-12-26: qty 30, 30d supply, fill #9
  Filled 2023-01-19: qty 30, 30d supply, fill #10
  Filled 2023-02-24: qty 30, 30d supply, fill #11

## 2022-03-10 ENCOUNTER — Other Ambulatory Visit (HOSPITAL_COMMUNITY): Payer: Self-pay

## 2022-03-25 ENCOUNTER — Other Ambulatory Visit (HOSPITAL_COMMUNITY): Payer: Self-pay

## 2022-03-28 ENCOUNTER — Other Ambulatory Visit (HOSPITAL_COMMUNITY): Payer: Self-pay

## 2022-03-29 ENCOUNTER — Other Ambulatory Visit (HOSPITAL_COMMUNITY): Payer: Self-pay

## 2022-04-14 ENCOUNTER — Other Ambulatory Visit (HOSPITAL_COMMUNITY): Payer: Self-pay

## 2022-04-18 ENCOUNTER — Telehealth: Payer: Self-pay

## 2022-04-18 NOTE — Telephone Encounter (Signed)
Caller states she called earlier and never heard back. Nurse made 3 attempts and pt said she has spam blocker. Transferring caller to nonurgent queue. Caller has a cough and theraflu isn't working. aller states has cough/nasal congestion and Theraflu isn't working. No fever. Dry cough last week, Friday started with nasal congestion and chest congestion = small white phlegm  04/18/2022 3:35:35 PM Home Care Izora Ribas, RN, Melanie  Comments User: Patria Mane, RN Date/Time Lamount Cohen Time): 04/18/2022 3:22:15 PM At home test for Covid, last Tuesday, negative.  Care Advice Given Per Guideline HOME CARE: * You should be able to treat this at home. REASSURANCE AND EDUCATION - COUGH: COUGH MEDICINES: COUGH SYRUP WITH DEXTROMETHORPHAN: * HOME REMEDY - HARD CANDY: Hard candy works just as well as over-the-counter cough drops. People who have diabetes should use sugar-free candy. * HOME REMEDY - HONEY: This old home remedy has been shown to help decrease coughing at night. The adult dosage is 2 teaspoons (10 ml) at bedtime. CALL BACK IF: HUMIDIFIER: DRINK PLENTY OF LIQUIDS: CARE ADVICE given per Cough - Acute Productive (Adult) guideline. * You become worse * Fever lasts over 3 days * Fever over 103 F (39.4 C) * Difficulty breathing occurs * Continuous coughing persists over 2 hours after cough treatment * Cough lasts over 3 weeks FOR A RUNNY NOSE - BLOW YOUR NOSE: NASAL WASHES FOR A STUFFY NOSE: REASSURANCE AND EDUCATION - COUGH WITH COMMON COLD SYMPTOMS: CALL BACK IF: * You become worse

## 2022-04-21 ENCOUNTER — Ambulatory Visit: Payer: BC Managed Care – PPO | Admitting: Adult Health

## 2022-04-21 ENCOUNTER — Encounter: Payer: Self-pay | Admitting: Adult Health

## 2022-04-21 VITALS — BP 126/82 | HR 75 | Temp 98.5°F | Ht 68.0 in | Wt 226.0 lb

## 2022-04-21 DIAGNOSIS — J0111 Acute recurrent frontal sinusitis: Secondary | ICD-10-CM | POA: Diagnosis not present

## 2022-04-21 MED ORDER — DOXYCYCLINE HYCLATE 100 MG PO CAPS
100.0000 mg | ORAL_CAPSULE | Freq: Two times a day (BID) | ORAL | 0 refills | Status: DC
Start: 2022-04-21 — End: 2022-11-02

## 2022-04-21 NOTE — Progress Notes (Signed)
Subjective:    Patient ID: [name], female    DOB: Jun 10, 1966, 56 y.o.   MRN: [id number]  HPI  56 year old female who  has a past medical history of Allergy, Anxiety, HIV (human immunodeficiency virus infection) (HCC), Hypertension, Insomnia, and Migraine.  She presents to the office toady for an acute issue. Her symptoms started about two weeks ago with a dry cough that progressed into sinus congestion and nasal pressure.   She has been using Mucinex Fast Max and saline solution with relief over the last 2 days.   She has not had any fever/chills/shortness of breath/wheezing.   She took a covid test and it was negative   Review of Systems See HPI   Past Medical History:  Diagnosis Date   Allergy    Anxiety    HIV (human immunodeficiency virus infection) (HCC)    Hypertension    Insomnia    Migraine     Social History   Socioeconomic History   Marital status: Single    Spouse name: Not on file   Number of children: 1   Years of education: Not on file   Highest education level: Not on file  Occupational History   Occupation: Environmental health practitioner    Employer: [employer]  Tobacco Use   Smoking status: Never   Smokeless tobacco: Never  Vaping Use   Vaping Use: Never used  Substance and Sexual Activity   Alcohol use: Yes    Alcohol/week: 0.0 standard drinks of alcohol    Comment: socially   Drug use: No   Sexual activity: Yes    Partners: Male    Birth control/protection: Abstinence, Surgical  Other Topics Concern   Not on file  Social History Narrative   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    Social Determinants of Health   Financial Resource Strain: Not on file  Food Insecurity: Not on file  Transportation Needs: Not on file  Physical Activity: Not on file  Stress: Not on file  Social Connections: Not on file  Intimate Partner  Violence: Not on file    Past Surgical History:  Procedure Laterality Date   ABDOMINAL HYSTERECTOMY Right 2004   partial    Family History  Problem Relation Age of Onset   Cerebral aneurysm Father 8   Aortic aneurysm Father    Diabetes Other    Hypertension Other    Brain cancer Other    Breast cancer Neg Hx    Colon cancer Neg Hx    Esophageal cancer Neg Hx    Stomach cancer Neg Hx    Rectal cancer Neg Hx     Allergies  Allergen Reactions   Iodinated Contrast Media Other (See Comments)    Pain in my eyes .    Sulfonamide Derivatives Hives    Current Outpatient Medications on File Prior to Visit  Medication Sig Dispense Refill   cholecalciferol (VITAMIN D3) 25 MCG (1000 UNIT) tablet Take 1,000 Units by mouth daily.     clonazePAM (KLONOPIN) 0.5 MG tablet Take 1 tablet (0.5 mg total) by mouth daily as needed for anxiety. 30 tablet 2   cyanocobalamin 100 MCG tablet Take 1 tablet by mouth daily.     dolutegravir-lamiVUDine (DOVATO) 50-300 MG tablet Take 1 tablet by mouth daily. 30 tablet 11  hydrochlorothiazide (HYDRODIURIL) 25 MG tablet TAKE 1 TABLET BY MOUTH ONCE DAILY (PATIENT  NEEDS  PHYSICAL) 90 tablet 3   Probiotic Product (PROBIOTIC BLEND PO) Take 1 tablet by mouth.     No current facility-administered medications on file prior to visit.    BP 126/82   Pulse 75   Temp 98.5 F (36.9 C) (Oral)   Ht 5\' 8"  (1.727 m)   Wt 226 lb (102.5 kg)   SpO2 98%   BMI 34.36 kg/m       Objective:   Physical Exam Vitals and nursing note reviewed.  Constitutional:      Appearance: Normal appearance.  HENT:     Right Ear: Tympanic membrane, ear canal and external ear normal.     Left Ear: Tympanic membrane, ear canal and external ear normal.     Nose: Congestion present. No rhinorrhea.     Mouth/Throat:     Mouth: Mucous membranes are moist.     Pharynx: Oropharynx is clear.  Cardiovascular:     Rate and Rhythm: Normal rate and regular rhythm.     Pulses: Normal  pulses.     Heart sounds: Normal heart sounds.  Pulmonary:     Effort: Pulmonary effort is normal.     Breath sounds: Normal breath sounds.  Neurological:     Mental Status: She is alert.       Assessment & Plan:  1. Acute recurrent frontal sinusitis - I am glad she is feeling better. Advised to continue with mucinex and saline rinses over the next few days and then stop. Due to time frame and going into the weekend will send in doxycycline to take if symptoms become worse. She knows not to take the antibiotic unless needed - doxycycline (VIBRAMYCIN) 100 MG capsule; Take 1 capsule (100 mg total) by mouth 2 (two) times daily.  Dispense: 14 capsule; Refill: 0  , NP

## 2022-04-29 ENCOUNTER — Telehealth: Payer: Self-pay

## 2022-04-29 NOTE — Telephone Encounter (Signed)
Patient notified of update  and verbalized understanding. 

## 2022-04-29 NOTE — Telephone Encounter (Signed)
--  Caller states she was seen last week for nasal congestion, but now has a more consistent cough. States it gets worse when she gets hot and starts talking.  04/28/2022 3:51:32 PM See PCP within 2 Cristino Martes, RN, [facility]  Referrals REFERRED TO PCP OFFICE

## 2022-05-04 ENCOUNTER — Other Ambulatory Visit (HOSPITAL_COMMUNITY): Payer: Self-pay

## 2022-05-05 ENCOUNTER — Other Ambulatory Visit (HOSPITAL_COMMUNITY): Payer: Self-pay

## 2022-05-17 ENCOUNTER — Other Ambulatory Visit (HOSPITAL_COMMUNITY): Payer: Self-pay

## 2022-06-03 ENCOUNTER — Other Ambulatory Visit (HOSPITAL_COMMUNITY): Payer: Self-pay

## 2022-06-08 ENCOUNTER — Other Ambulatory Visit (HOSPITAL_COMMUNITY): Payer: Self-pay

## 2022-06-16 ENCOUNTER — Other Ambulatory Visit (HOSPITAL_COMMUNITY): Payer: Self-pay

## 2022-06-20 ENCOUNTER — Other Ambulatory Visit (HOSPITAL_COMMUNITY): Payer: Self-pay

## 2022-06-22 ENCOUNTER — Other Ambulatory Visit (HOSPITAL_COMMUNITY): Payer: Self-pay

## 2022-07-18 ENCOUNTER — Other Ambulatory Visit (HOSPITAL_COMMUNITY): Payer: Self-pay

## 2022-07-20 ENCOUNTER — Other Ambulatory Visit (HOSPITAL_COMMUNITY): Payer: Self-pay

## 2022-07-21 ENCOUNTER — Other Ambulatory Visit (HOSPITAL_COMMUNITY): Payer: Self-pay

## 2022-07-22 ENCOUNTER — Other Ambulatory Visit (HOSPITAL_COMMUNITY): Payer: Self-pay

## 2022-08-17 ENCOUNTER — Other Ambulatory Visit: Payer: Self-pay

## 2022-08-19 ENCOUNTER — Other Ambulatory Visit (HOSPITAL_COMMUNITY): Payer: Self-pay

## 2022-08-24 ENCOUNTER — Other Ambulatory Visit: Payer: Self-pay

## 2022-09-15 ENCOUNTER — Other Ambulatory Visit (HOSPITAL_COMMUNITY): Payer: Self-pay

## 2022-09-19 ENCOUNTER — Other Ambulatory Visit (HOSPITAL_COMMUNITY): Payer: Self-pay

## 2022-09-21 ENCOUNTER — Other Ambulatory Visit (HOSPITAL_COMMUNITY): Payer: Self-pay

## 2022-09-23 ENCOUNTER — Other Ambulatory Visit (HOSPITAL_COMMUNITY): Payer: Self-pay

## 2022-09-23 ENCOUNTER — Other Ambulatory Visit: Payer: Self-pay

## 2022-09-30 ENCOUNTER — Encounter: Payer: BC Managed Care – PPO | Admitting: Adult Health

## 2022-10-20 ENCOUNTER — Other Ambulatory Visit (HOSPITAL_COMMUNITY): Payer: Self-pay

## 2022-10-24 ENCOUNTER — Other Ambulatory Visit: Payer: Self-pay

## 2022-10-31 ENCOUNTER — Other Ambulatory Visit: Payer: Self-pay | Admitting: Adult Health

## 2022-10-31 ENCOUNTER — Other Ambulatory Visit (HOSPITAL_COMMUNITY): Payer: Self-pay

## 2022-10-31 DIAGNOSIS — F411 Generalized anxiety disorder: Secondary | ICD-10-CM

## 2022-10-31 DIAGNOSIS — I10 Essential (primary) hypertension: Secondary | ICD-10-CM

## 2022-11-01 NOTE — Telephone Encounter (Signed)
Okay for refill?  

## 2022-11-02 ENCOUNTER — Ambulatory Visit (INDEPENDENT_AMBULATORY_CARE_PROVIDER_SITE_OTHER): Payer: BC Managed Care – PPO | Admitting: Adult Health

## 2022-11-02 VITALS — BP 122/80 | HR 64 | Temp 98.1°F | Ht 67.0 in | Wt 228.0 lb

## 2022-11-02 DIAGNOSIS — Z0000 Encounter for general adult medical examination without abnormal findings: Secondary | ICD-10-CM | POA: Diagnosis not present

## 2022-11-02 DIAGNOSIS — I10 Essential (primary) hypertension: Secondary | ICD-10-CM | POA: Diagnosis not present

## 2022-11-02 DIAGNOSIS — F411 Generalized anxiety disorder: Secondary | ICD-10-CM

## 2022-11-02 DIAGNOSIS — B20 Human immunodeficiency virus [HIV] disease: Secondary | ICD-10-CM

## 2022-11-02 LAB — COMPREHENSIVE METABOLIC PANEL
ALT: 8 U/L (ref 0–35)
AST: 14 U/L (ref 0–37)
Albumin: 3.7 g/dL (ref 3.5–5.2)
Alkaline Phosphatase: 97 U/L (ref 39–117)
BUN: 13 mg/dL (ref 6–23)
CO2: 28 mEq/L (ref 19–32)
Calcium: 9.3 mg/dL (ref 8.4–10.5)
Chloride: 106 mEq/L (ref 96–112)
Creatinine, Ser: 1.11 mg/dL (ref 0.40–1.20)
GFR: 55.62 mL/min — ABNORMAL LOW (ref >60.00)
Glucose, Bld: 82 mg/dL (ref 70–99)
Potassium: 3.7 mEq/L (ref 3.5–5.1)
Sodium: 141 mEq/L (ref 135–145)
Total Bilirubin: 0.4 mg/dL (ref 0.2–1.2)
Total Protein: 7 g/dL (ref 6.0–8.3)

## 2022-11-02 LAB — CBC WITH DIFFERENTIAL/PLATELET
Basophils Absolute: 0 10*3/uL (ref 0.0–0.1)
Basophils Relative: 0.4 % (ref 0.0–3.0)
Eosinophils Absolute: 0.1 10*3/uL (ref 0.0–0.7)
Eosinophils Relative: 2.4 % (ref 0.0–5.0)
HCT: 38.6 % (ref 36.0–46.0)
Hemoglobin: 12.5 g/dL (ref 12.0–15.0)
Lymphocytes Relative: 33.7 % (ref 12.0–46.0)
Lymphs Abs: 1.6 10*3/uL (ref 0.7–4.0)
MCHC: 32.5 g/dL (ref 30.0–36.0)
MCV: 87.2 fl (ref 78.0–100.0)
Monocytes Absolute: 0.4 10*3/uL (ref 0.1–1.0)
Monocytes Relative: 8.4 % (ref 3.0–12.0)
Neutro Abs: 2.6 10*3/uL (ref 1.4–7.7)
Neutrophils Relative %: 55.1 % (ref 43.0–77.0)
Platelets: 326 10*3/uL (ref 150.0–400.0)
RBC: 4.42 Mil/uL (ref 3.87–5.11)
RDW: 15.3 % (ref 11.5–15.5)
WBC: 4.7 10*3/uL (ref 4.0–10.5)

## 2022-11-02 LAB — LIPID PANEL
Cholesterol: 186 mg/dL (ref 0–200)
HDL: 48.2 mg/dL (ref >39.00)
LDL Cholesterol: 124 mg/dL — ABNORMAL HIGH (ref 0–99)
NonHDL: 138.18
Total CHOL/HDL Ratio: 4
Triglycerides: 71 mg/dL (ref 0.0–149.0)
VLDL: 14.2 mg/dL (ref 0.0–40.0)

## 2022-11-02 LAB — TSH: TSH: 1.21 u[IU]/mL (ref 0.35–5.50)

## 2022-11-02 NOTE — Patient Instructions (Signed)
It was great seeing you today   We will follow up with you regarding your lab work   Please let me know if you need anything   

## 2022-11-02 NOTE — Progress Notes (Signed)
Subjective:    Patient ID: [name], female    DOB: 08-19-1966, 57 y.o.   MRN: [id number]  HPI Patient presents for yearly preventative medicine examination. She is a pleasant 57 year old female who  has a past medical history of Allergy, Anxiety, HIV (human immunodeficiency virus infection) ([place]), Hypertension, Insomnia, and Migraine.  Hypertension-is currently prescribed HCTZ 25 mg daily.  She denies dizziness, lightheadedness, chest pain, shortness of breath BP Readings from Last 3 Encounters:  11/02/22 (!) 130/90  04/21/22 126/82  01/14/22 125/90   HIV disease-followed by infectious disease.  Currently managed with Dovato 50-300 mg Lab Results  Component Value Date   CD4TCELL 50 01/14/2022   CD4TABS 856 04/16/2020   Anxiety-situational in nature. Takes Klonopin as needed  All immunizations and health maintenance protocols were reviewed with the patient and needed orders were placed.  Appropriate screening laboratory values were ordered for the patient including screening of hyperlipidemia, renal function and hepatic function.  Medication reconciliation,  past medical history, social history, problem list and allergies were reviewed in detail with the patient  Goals were established with regard to weight loss, exercise, and  diet in compliance with medications Wt Readings from Last 3 Encounters:  11/02/22 228 lb (103.4 kg)  04/21/22 226 lb (102.5 kg)  01/14/22 230 lb (104.3 kg)   Review of Systems  Constitutional: Negative.   HENT: Negative.    Eyes: Negative.   Respiratory: Negative.    Cardiovascular: Negative.   Gastrointestinal: Negative.   Endocrine: Negative.   Genitourinary: Negative.   Musculoskeletal: Negative.   Skin: Negative.   Allergic/Immunologic: Negative.   Neurological: Negative.   Hematological: Negative.   Psychiatric/Behavioral: Negative.     Past Medical History:  Diagnosis Date   Allergy    Anxiety    HIV (human immunodeficiency  virus infection) ([place])    Hypertension    Insomnia    Migraine     Social History   Socioeconomic History   Marital status: Single    Spouse name: Not on file   Number of children: 1   Years of education: Not on file   Highest education level: Not on file  Occupational History   Occupation: Web designer    Employer: [employer]  Tobacco Use   Smoking status: Never   Smokeless tobacco: Never  Vaping Use   Vaping Use: Never used  Substance and Sexual Activity   Alcohol use: Yes    Alcohol/week: 0.0 standard drinks of alcohol    Comment: socially   Drug use: No   Sexual activity: Yes    Partners: Male    Birth control/protection: Abstinence, Surgical  Other Topics Concern   Not on file  Social History Narrative   She works at [employer] as an Web designer for [employer]    Divorced   One son who lives in [place]      She likes to shop and travel.    Social Determinants of Health   Financial Resource Strain: Not on file  Food Insecurity: Not on file  Transportation Needs: Not on file  Physical Activity: Not on file  Stress: Not on file  Social Connections: Not on file  Intimate Partner Violence: Not on file    Past Surgical History:  Procedure Laterality Date   ABDOMINAL HYSTERECTOMY Right 2004   partial    Family History  Problem Relation Age of Onset   Cerebral aneurysm Father 50   Aortic aneurysm Father  Diabetes Other    Hypertension Other    Brain cancer Other    Breast cancer Neg Hx    Colon cancer Neg Hx    Esophageal cancer Neg Hx    Stomach cancer Neg Hx    Rectal cancer Neg Hx     Allergies  Allergen Reactions   Iodinated Contrast Media Other (See Comments)    Pain in my eyes .    Sulfonamide Derivatives Hives    Current Outpatient Medications on File Prior to Visit  Medication Sig Dispense Refill   cholecalciferol (VITAMIN D3) 25 MCG (1000 UNIT) tablet Take 1,000 Units by mouth daily.     clonazePAM  (KLONOPIN) 0.5 MG tablet TAKE 1 TABLET BY MOUTH EVERY DAY AS NEEDED FOR ANXIETY 30 tablet 2   cyanocobalamin 100 MCG tablet Take 1 tablet by mouth daily.     dolutegravir-lamiVUDine (DOVATO) 50-300 MG tablet Take 1 tablet by mouth daily. 30 tablet 11   hydrochlorothiazide (HYDRODIURIL) 25 MG tablet TAKE 1 TABLET BY MOUTH ONCE DAILY (PATIENT NEEDS PHYSICAL) 90 tablet 3   Probiotic Product (PROBIOTIC BLEND PO) Take 1 tablet by mouth. (Patient not taking: Reported on 11/02/2022)     No current facility-administered medications on file prior to visit.    BP (!) 130/90   Pulse 64   Temp 98.1 F (36.7 C) (Oral)   Ht '5\' 7"'$  (1.702 m)   Wt 228 lb (103.4 kg)   SpO2 99%   BMI 35.71 kg/m       Objective:   Physical Exam Vitals and nursing note reviewed.  Constitutional:      General: She is not in acute distress.    Appearance: Normal appearance. She is well-developed. She is not ill-appearing.  HENT:     Head: Normocephalic and atraumatic.     Right Ear: Tympanic membrane, ear canal and external ear normal. There is no impacted cerumen.     Left Ear: Tympanic membrane, ear canal and external ear normal. There is no impacted cerumen.     Nose: Nose normal. No congestion or rhinorrhea.     Mouth/Throat:     Mouth: Mucous membranes are moist.     Pharynx: Oropharynx is clear. No oropharyngeal exudate or posterior oropharyngeal erythema.  Eyes:     General:        Right eye: No discharge.        Left eye: No discharge.     Extraocular Movements: Extraocular movements intact.     Conjunctiva/sclera: Conjunctivae normal.     Pupils: Pupils are equal, round, and reactive to light.  Neck:     Thyroid: No thyromegaly.     Vascular: No carotid bruit.     Trachea: No tracheal deviation.  Cardiovascular:     Rate and Rhythm: Normal rate and regular rhythm.     Pulses: Normal pulses.     Heart sounds: Normal heart sounds. No murmur heard.    No friction rub. No gallop.  Pulmonary:      Effort: Pulmonary effort is normal. No respiratory distress.     Breath sounds: Normal breath sounds. No stridor. No wheezing, rhonchi or rales.  Chest:     Chest wall: No tenderness.  Abdominal:     General: Abdomen is flat. Bowel sounds are normal. There is no distension.     Palpations: Abdomen is soft. There is no mass.     Tenderness: There is no abdominal tenderness. There is no right CVA tenderness, left CVA  tenderness, guarding or rebound.     Hernia: No hernia is present.  Musculoskeletal:        General: No swelling, tenderness, deformity or signs of injury. Normal range of motion.     Cervical back: Normal range of motion and neck supple.     Right lower leg: No edema.     Left lower leg: No edema.  Lymphadenopathy:     Cervical: No cervical adenopathy.  Skin:    General: Skin is warm and dry.     Coloration: Skin is not jaundiced or pale.     Findings: No bruising, erythema, lesion or rash.  Neurological:     General: No focal deficit present.     Mental Status: She is alert and oriented to person, place, and time.     Cranial Nerves: No cranial nerve deficit.     Sensory: No sensory deficit.     Motor: No weakness.     Coordination: Coordination normal.     Gait: Gait normal.     Deep Tendon Reflexes: Reflexes normal.  Psychiatric:        Mood and Affect: Mood normal.        Behavior: Behavior normal.        Thought Content: Thought content normal.        Judgment: Judgment normal.        Assessment & Plan:  1. Routine general medical examination at a health care facility Today patient counseled on age appropriate routine health concerns for screening and prevention, each reviewed and up to date or declined. Immunizations reviewed and up to date or declined. Labs ordered and reviewed. Risk factors for depression reviewed and negative. Hearing function and visual acuity are intact. ADLs screened and addressed as needed. Functional ability and level of safety  reviewed and appropriate. Education, counseling and referrals performed based on assessed risks today. Patient provided with a copy of personalized plan for preventive services.   2. Essential hypertension - well controlled. No change in medication  - CBC with Differential/Platelet; Future - Comprehensive metabolic panel; Future - Lipid panel; Future - TSH; Future  3. Human immunodeficiency virus (HIV) disease ([place]) - Per ID  - CBC with Differential/Platelet; Future - Comprehensive metabolic panel; Future - Lipid panel; Future - TSH; Future  4. Anxiety state - Continue with klonopin  - CBC with Differential/Platelet; Future - Comprehensive metabolic panel; Future - Lipid panel; Future - TSH; Future  Dorothyann Peng, NP

## 2022-11-03 ENCOUNTER — Other Ambulatory Visit (HOSPITAL_COMMUNITY): Payer: Self-pay

## 2022-11-22 ENCOUNTER — Other Ambulatory Visit (HOSPITAL_COMMUNITY): Payer: Self-pay

## 2022-11-24 ENCOUNTER — Other Ambulatory Visit (HOSPITAL_COMMUNITY): Payer: Self-pay

## 2022-11-29 ENCOUNTER — Other Ambulatory Visit: Payer: Self-pay

## 2022-12-02 ENCOUNTER — Other Ambulatory Visit (HOSPITAL_COMMUNITY): Payer: Self-pay

## 2022-12-08 ENCOUNTER — Ambulatory Visit (INDEPENDENT_AMBULATORY_CARE_PROVIDER_SITE_OTHER): Payer: BC Managed Care – PPO

## 2022-12-08 ENCOUNTER — Encounter: Payer: Self-pay | Admitting: Adult Health

## 2022-12-08 ENCOUNTER — Ambulatory Visit: Payer: BC Managed Care – PPO | Admitting: Adult Health

## 2022-12-08 VITALS — BP 120/82 | HR 97 | Temp 98.0°F | Ht 67.0 in | Wt 228.0 lb

## 2022-12-08 DIAGNOSIS — M79672 Pain in left foot: Secondary | ICD-10-CM | POA: Diagnosis not present

## 2022-12-08 NOTE — Progress Notes (Signed)
Subjective:    Patient ID: [name], female    DOB: November 07, 1965, 57 y.o.   MRN: [id number]  HPI  57 year old female who  has a past medical history of Allergy, Anxiety, HIV (human immunodeficiency virus infection), Hypertension, Insomnia, and Migraine.  She presents to the office today for an acute issue of left heel pain. She reports that about a week ago she noticed pain and a "bump" on the back of her left heel.  Over the week it has been difficult to walk and she has pain with doing so.  Symptoms seem to be worse at night.  Will even over the last day or 2 she has noticed some improvement in her pain.  She denies any trauma or aggravating injury.   Pain seems to be worse when walking and use with a strap that rubs against the heel  Review of Systems See HPI   Past Medical History:  Diagnosis Date   Allergy    Anxiety    HIV (human immunodeficiency virus infection)    Hypertension    Insomnia    Migraine     Social History   Socioeconomic History   Marital status: Single    Spouse name: Not on file   Number of children: 1   Years of education: Not on file   Highest education level: Not on file  Occupational History   Occupation: Web designer    Employer: [employer]  Tobacco Use   Smoking status: Never   Smokeless tobacco: Never  Vaping Use   Vaping Use: Never used  Substance and Sexual Activity   Alcohol use: Yes    Alcohol/week: 0.0 standard drinks of alcohol    Comment: socially   Drug use: No   Sexual activity: Yes    Partners: Male    Birth control/protection: Abstinence, Surgical  Other Topics Concern   Not on file  Social History Narrative   She works at [employer] as an Web designer for [employer]    Divorced   One son who lives in [place]      She likes to shop and travel.    Social Determinants of Health   Financial Resource Strain: Not on file  Food Insecurity: Not on file  Transportation Needs: Not on file   Physical Activity: Not on file  Stress: Not on file  Social Connections: Not on file  Intimate Partner Violence: Not on file    Past Surgical History:  Procedure Laterality Date   ABDOMINAL HYSTERECTOMY Right 2004   partial    Family History  Problem Relation Age of Onset   Cerebral aneurysm Father 12   Aortic aneurysm Father    Diabetes Other    Hypertension Other    Brain cancer Other    Breast cancer Neg Hx    Colon cancer Neg Hx    Esophageal cancer Neg Hx    Stomach cancer Neg Hx    Rectal cancer Neg Hx     Allergies  Allergen Reactions   Iodinated Contrast Media Other (See Comments)    Pain in my eyes .    Sulfonamide Derivatives Hives    Current Outpatient Medications on File Prior to Visit  Medication Sig Dispense Refill   cholecalciferol (VITAMIN D3) 25 MCG (1000 UNIT) tablet Take 1,000 Units by mouth daily.     clonazePAM (KLONOPIN) 0.5 MG tablet TAKE 1 TABLET BY MOUTH EVERY DAY AS NEEDED FOR ANXIETY 30 tablet 2  cyanocobalamin 100 MCG tablet Take 1 tablet by mouth daily.     dolutegravir-lamiVUDine (DOVATO) 50-300 MG tablet Take 1 tablet by mouth daily. 30 tablet 11   hydrochlorothiazide (HYDRODIURIL) 25 MG tablet TAKE 1 TABLET BY MOUTH ONCE DAILY (PATIENT NEEDS PHYSICAL) 90 tablet 3   Probiotic Product (PROBIOTIC BLEND PO) Take 1 tablet by mouth.     No current facility-administered medications on file prior to visit.    BP 120/82   Pulse 97   Temp 98 F (36.7 C) (Oral)   Ht 5\' 7"  (1.702 m)   Wt 228 lb (103.4 kg)   SpO2 97%   BMI 35.71 kg/m       Objective:   Physical Exam Vitals and nursing note reviewed.  Constitutional:      Appearance: Normal appearance.  Musculoskeletal:     Left ankle:     Left Achilles Tendon: Tenderness present. No defects.     Comments: Pain with palpation at the site of retrocalcaneal bursa  Neurological:     Mental Status: She is alert.       Assessment & Plan:  1. Pain of left heel Retrocalcaneal  bursitis versus bone spur versus Achilles tendinitis.  Advised ice to foot bath, rest, anti-inflammatories such as Aleve, and elevation over the weekend.  Will get x-ray of the left foot today to rule out bone spur.  Will follow-up with her once x-ray is back. - DG Foot Complete Left; Future  Dorothyann Peng, NP

## 2022-12-13 ENCOUNTER — Telehealth: Payer: Self-pay | Admitting: Adult Health

## 2022-12-13 NOTE — Telephone Encounter (Signed)
Called and updated patient on her xray. She does report that her foot feels " much better than it did last week"

## 2022-12-22 ENCOUNTER — Other Ambulatory Visit (HOSPITAL_COMMUNITY): Payer: Self-pay

## 2022-12-26 ENCOUNTER — Other Ambulatory Visit (HOSPITAL_COMMUNITY): Payer: Self-pay

## 2022-12-26 ENCOUNTER — Other Ambulatory Visit: Payer: Self-pay

## 2022-12-28 ENCOUNTER — Other Ambulatory Visit: Payer: Self-pay

## 2023-01-16 ENCOUNTER — Other Ambulatory Visit: Payer: Self-pay | Admitting: Adult Health

## 2023-01-16 DIAGNOSIS — Z1231 Encounter for screening mammogram for malignant neoplasm of breast: Secondary | ICD-10-CM

## 2023-01-19 ENCOUNTER — Other Ambulatory Visit (HOSPITAL_COMMUNITY): Payer: Self-pay

## 2023-01-31 ENCOUNTER — Other Ambulatory Visit (HOSPITAL_COMMUNITY): Payer: Self-pay

## 2023-02-07 ENCOUNTER — Encounter: Payer: BC Managed Care – PPO | Admitting: Infectious Diseases

## 2023-02-23 ENCOUNTER — Other Ambulatory Visit (HOSPITAL_COMMUNITY): Payer: Self-pay

## 2023-02-24 ENCOUNTER — Other Ambulatory Visit (HOSPITAL_COMMUNITY): Payer: Self-pay

## 2023-03-01 ENCOUNTER — Other Ambulatory Visit (HOSPITAL_COMMUNITY): Payer: Self-pay

## 2023-03-15 ENCOUNTER — Ambulatory Visit: Payer: BC Managed Care – PPO

## 2023-03-15 ENCOUNTER — Ambulatory Visit
Admission: RE | Admit: 2023-03-15 | Discharge: 2023-03-15 | Disposition: A | Discharge status: Home / Self Care | Payer: BC Managed Care – PPO | Source: Ambulatory Visit | Attending: Adult Health | Admitting: Adult Health

## 2023-03-15 DIAGNOSIS — Z1231 Encounter for screening mammogram for malignant neoplasm of breast: Secondary | ICD-10-CM | POA: Diagnosis not present

## 2023-03-28 ENCOUNTER — Other Ambulatory Visit (HOSPITAL_COMMUNITY): Payer: Self-pay

## 2023-03-30 ENCOUNTER — Other Ambulatory Visit: Payer: Self-pay

## 2023-03-30 ENCOUNTER — Other Ambulatory Visit: Payer: Self-pay | Admitting: Infectious Diseases

## 2023-03-30 ENCOUNTER — Other Ambulatory Visit (HOSPITAL_COMMUNITY): Payer: Self-pay

## 2023-03-30 DIAGNOSIS — B20 Human immunodeficiency virus [HIV] disease: Secondary | ICD-10-CM

## 2023-03-30 MED ORDER — DOVATO 50-300 MG PO TABS
1.0000 | ORAL_TABLET | Freq: Every day | ORAL | 11 refills | Status: DC
  Filled 2023-03-30: qty 30, 30d supply, fill #0
  Filled 2023-04-28: qty 30, 30d supply, fill #1
  Filled 2023-05-31: qty 30, 30d supply, fill #2
  Filled 2023-06-30: qty 30, 30d supply, fill #3
  Filled 2023-08-01: qty 30, 30d supply, fill #4
  Filled 2023-08-25: qty 30, 30d supply, fill #5
  Filled 2023-10-18: qty 30, 30d supply, fill #6
  Filled 2023-11-17: qty 30, 30d supply, fill #7
  Filled 2023-12-15: qty 30, 30d supply, fill #8
  Filled 2024-01-22: qty 30, 30d supply, fill #9
  Filled 2024-02-22: qty 30, 30d supply, fill #10
  Filled 2024-03-28: qty 30, 30d supply, fill #11

## 2023-03-31 ENCOUNTER — Other Ambulatory Visit: Payer: Self-pay

## 2023-04-24 ENCOUNTER — Other Ambulatory Visit (HOSPITAL_COMMUNITY): Payer: Self-pay

## 2023-04-28 ENCOUNTER — Other Ambulatory Visit (HOSPITAL_COMMUNITY): Payer: Self-pay

## 2023-05-01 ENCOUNTER — Other Ambulatory Visit (HOSPITAL_COMMUNITY): Payer: Self-pay

## 2023-05-24 NOTE — Telephone Encounter (Signed)
Patient did return call in regards to scheduling. [name] stated that [name] is going to follow Dr. Ninetta Lights and I did provider her the contact information for [facility] for scheduling.

## 2023-05-25 ENCOUNTER — Other Ambulatory Visit (HOSPITAL_COMMUNITY): Payer: Self-pay

## 2023-05-27 ENCOUNTER — Encounter (HOSPITAL_COMMUNITY): Payer: Self-pay

## 2023-05-29 ENCOUNTER — Other Ambulatory Visit: Payer: Self-pay

## 2023-05-31 ENCOUNTER — Other Ambulatory Visit: Payer: Self-pay

## 2023-05-31 ENCOUNTER — Encounter (HOSPITAL_COMMUNITY): Payer: Self-pay

## 2023-05-31 NOTE — Progress Notes (Signed)
Specialty Pharmacy Refill Coordination Note  [name] is a 57 y.o. female contacted today regarding refills of specialty medication(s) Dolutegravir-Lamivudine .  Patient requested Delivery  on 06/08/23  to verified address [address] [place], [place] [address]   Medication will be filled on 06/07/2023.

## 2023-05-31 NOTE — Progress Notes (Signed)
Specialty Pharmacy Ongoing Clinical Assessment Note  [name] is a 57 y.o. female who is being followed by the specialty pharmacy service for RxSp HIV   Review of patient's specialty medication(s) Dolutegravir-Lamivudine  occurred today.   Patient has missed 0  doses in the last 4 weeks.   Patient did not have any additional questions or concerns.   Therapeutic benefit summary: Patient is achieving benefit   Adverse events/side effects summary: No adverse events/side effects   Patient's therapy is appropriate to : Continue    Goals      Achieve Undetectable HIV Viral Load < 20     Patient is on track . Patient will maintain adherence         Follow up:  6 months

## 2023-06-28 ENCOUNTER — Other Ambulatory Visit: Payer: Self-pay

## 2023-06-30 ENCOUNTER — Other Ambulatory Visit: Payer: Self-pay

## 2023-06-30 ENCOUNTER — Other Ambulatory Visit (HOSPITAL_COMMUNITY): Payer: Self-pay

## 2023-06-30 ENCOUNTER — Other Ambulatory Visit (HOSPITAL_COMMUNITY): Payer: Self-pay | Admitting: Pharmacy Technician

## 2023-06-30 NOTE — Progress Notes (Signed)
Specialty Pharmacy Refill Coordination Note  [name] is a 57 y.o. female contacted today regarding refills of specialty medication(s) Dolutegravir-Lamivudine   Patient requested Delivery   Delivery date: 07/05/23   Verified address: [address] [name]   Medication will be filled on 07/04/23.

## 2023-07-03 ENCOUNTER — Other Ambulatory Visit: Payer: Self-pay

## 2023-07-10 ENCOUNTER — Other Ambulatory Visit: Payer: Self-pay

## 2023-07-11 ENCOUNTER — Other Ambulatory Visit: Payer: Self-pay

## 2023-07-11 NOTE — Progress Notes (Addendum)
Patient called 114 and package had not been delivered. UPS tracking showd "processing at facility" 11/1-11/4. Sent request for return to sender and reshipping 11/5 for delivery 11/6. New tracking number: [id number].

## 2023-07-12 ENCOUNTER — Other Ambulatory Visit: Payer: Self-pay

## 2023-07-13 ENCOUNTER — Other Ambulatory Visit: Payer: Self-pay

## 2023-07-13 ENCOUNTER — Other Ambulatory Visit (HOSPITAL_COMMUNITY): Payer: Self-pay

## 2023-07-14 ENCOUNTER — Other Ambulatory Visit (HOSPITAL_COMMUNITY): Payer: Self-pay

## 2023-07-14 ENCOUNTER — Other Ambulatory Visit: Payer: Self-pay

## 2023-07-14 NOTE — Progress Notes (Signed)
Patient was contacted regarding their speciality medication refill. Per [employer] Package is lost, another replacement shipment is being sent 07/14/23 [employer] Next Day Air, Tracking # [id number].

## 2023-07-18 ENCOUNTER — Telehealth: Payer: Self-pay | Admitting: Adult Health

## 2023-07-18 DIAGNOSIS — F411 Generalized anxiety disorder: Secondary | ICD-10-CM

## 2023-07-18 MED ORDER — CLONAZEPAM 0.5 MG PO TABS
0.5000 mg | ORAL_TABLET | Freq: Every day | ORAL | 2 refills | Status: DC
Start: 2023-07-18 — End: 2024-01-26

## 2023-07-18 NOTE — Telephone Encounter (Signed)
Okay for refill?  

## 2023-07-18 NOTE — Telephone Encounter (Signed)
Patient notified of update  and verbalized understanding. 

## 2023-07-18 NOTE — Telephone Encounter (Addendum)
Prescription Request  07/18/2023  LOV: 12/08/2022  What is the name of the medication or equipment? clonazePAM (KLONOPIN) 0.5 MG tablet . Pt has appt on 11-08-2023  Have you contacted your pharmacy to request a refill? No   Which pharmacy would you like this sent to?   [id number] - [place], [place] - [address] Phone: [phone]  Fax: [phone]      Patient notified that their request is being sent to the clinical staff for review and that they should receive a response within 2 business days.   Please advise at Mobile [phone] (mobile)

## 2023-07-19 ENCOUNTER — Other Ambulatory Visit: Payer: Self-pay

## 2023-07-28 ENCOUNTER — Other Ambulatory Visit: Payer: Self-pay

## 2023-08-01 ENCOUNTER — Other Ambulatory Visit: Payer: Self-pay

## 2023-08-09 ENCOUNTER — Encounter: Payer: BC Managed Care – PPO | Admitting: Infectious Diseases

## 2023-08-15 ENCOUNTER — Ambulatory Visit: Payer: BC Managed Care – PPO | Admitting: Infectious Diseases

## 2023-08-15 VITALS — BP 142/103 | HR 88 | Ht 67.5 in | Wt 230.2 lb

## 2023-08-15 DIAGNOSIS — G4700 Insomnia, unspecified: Secondary | ICD-10-CM

## 2023-08-15 DIAGNOSIS — I10 Essential (primary) hypertension: Secondary | ICD-10-CM | POA: Diagnosis not present

## 2023-08-15 DIAGNOSIS — Z113 Encounter for screening for infections with a predominantly sexual mode of transmission: Secondary | ICD-10-CM | POA: Diagnosis not present

## 2023-08-15 DIAGNOSIS — F32A Depression, unspecified: Secondary | ICD-10-CM | POA: Insufficient documentation

## 2023-08-15 DIAGNOSIS — E881 Lipodystrophy, not elsewhere classified: Secondary | ICD-10-CM | POA: Diagnosis not present

## 2023-08-15 DIAGNOSIS — Z79899 Other long term (current) drug therapy: Secondary | ICD-10-CM | POA: Diagnosis not present

## 2023-08-15 DIAGNOSIS — B20 Human immunodeficiency virus [HIV] disease: Secondary | ICD-10-CM | POA: Diagnosis not present

## 2023-08-15 DIAGNOSIS — F329 Major depressive disorder, single episode, unspecified: Secondary | ICD-10-CM

## 2023-08-15 DIAGNOSIS — M7732 Calcaneal spur, left foot: Secondary | ICD-10-CM | POA: Insufficient documentation

## 2023-08-15 NOTE — Assessment & Plan Note (Signed)
Will have her see Marena Chancy, virtually if possible.

## 2023-08-15 NOTE — Assessment & Plan Note (Signed)
Wt unchanged after change to dovato.  Will continue to watch.

## 2023-08-15 NOTE — Progress Notes (Signed)
Subjective:    Patient ID: [name], female  DOB: 06/17/1966, 57 y.o.        MRN: [id number]   HPI 57 yo F with hx of HIV+, was previously on truvada/nelfinavir then switched to complera for simplicity. 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 In February 2013 was changed to tivicay-descovy--> biktarvy--> dovato. At her visit 02-2021, she was changed to dovato to see if it would aide in wt gain. She has had minimal change with this.    She takes klonazepam prn for panic issues. Has improved and takes rarely.     Hyst 2003, 1 ovary removed. Was seen by Dr [name] for GYN- PAP 2023. Is going through menopause now.  Mammo done 03-15-23 (nl). Had colon 2018.   Got flu vax 05-15-2023.  Very stressed as her son may be facing legal trouble. May need to help grad-daughter/daughter in law.  Work remains stressful.  Having trouble sleeping. Stopped taking gummies. Wakes up with heart racing.  Has some relief with deep breathing exercises, goes for a walk (after apple watch alarms for heart rate). Doesn't have much social support.   HIV 1 RNA Quant  Date Value  01/14/2022 NOT DETECTED copies/mL  04/16/2021 Not Detected Copies/mL  02/11/2021 Not Detected Copies/mL   CD4 T Cell Abs (/uL)  Date Value  04/16/2020 856  10/18/2019 796  07/05/2017 940     Health Maintenance  Topic Date Due  . COVID-19 Vaccine (3 - Pfizer risk series) 01/15/2020  . Cervical Cancer Screening (HPV/Pap Cotest)  12/22/2023  . DTaP/Tdap/Td (2 - Td or Tdap) 02/08/2024  . MAMMOGRAM  03/14/2025  . Colonoscopy  07/07/2027  . INFLUENZA VACCINE  Completed  . Hepatitis C Screening  Completed  . HIV Screening  Completed  . Zoster Vaccines- Shingrix  Completed  . HPV VACCINES  Aged Out      Review of Systems  Constitutional:  Negative for weight loss.  Respiratory:  Negative for shortness of breath.   Cardiovascular:  Negative for chest pain.   Neurological:  Positive for tingling. Negative for headaches.  Psychiatric/Behavioral:  Positive for depression (panic episode when son arrested).    Please see HPI. All other systems reviewed and negative.     Objective:  Physical Exam Constitutional:      Appearance: Normal appearance.  HENT:     Mouth/Throat:     Mouth: Mucous membranes are moist.     Pharynx: No oropharyngeal exudate.  Eyes:     Extraocular Movements: Extraocular movements intact.     Pupils: Pupils are equal, round, and reactive to light.  Cardiovascular:     Rate and Rhythm: Normal rate and regular rhythm.  Pulmonary:     Effort: Pulmonary effort is normal.     Breath sounds: Normal breath sounds.  Abdominal:     General: Bowel sounds are normal. There is no distension.     Palpations: Abdomen is soft.     Tenderness: There is no abdominal tenderness.  Musculoskeletal:        General: Normal range of motion.     Cervical back: Normal range of motion.     Right lower leg: No edema.     Left lower leg: No edema.  Neurological:     General: No focal deficit present.     Mental Status: She is alert.  Psychiatric:     Comments: Reactive deperession.  Assessment & Plan:

## 2023-08-15 NOTE — Assessment & Plan Note (Addendum)
She is doing well aside from her multiple stressors.  Will check her labs today, get her with counselor.  Will call her to f/u.  Rtc in 9 months No change in dovato.  PCV 20, next visit.

## 2023-08-15 NOTE — Addendum Note (Signed)
Addended by: Maura Crandall on: 08/15/2023 04:32 PM   Modules accepted: Orders

## 2023-08-15 NOTE — Assessment & Plan Note (Signed)
Continue prn klonipin, gummies.

## 2023-08-15 NOTE — Assessment & Plan Note (Signed)
Unable to wear healed shoes.  Will get her in with Ortho, appt in Jan 2025.

## 2023-08-15 NOTE — Assessment & Plan Note (Addendum)
Suspect elevated due to walk to clinica and multiple life stressors.  No change in meds at this times, asx.  Repeat SBP 140.

## 2023-08-16 LAB — T-HELPER CELLS (CD4) COUNT (NOT AT ARMC)
CD4 % Helper T Cell: 54 % (ref 33–65)
CD4 T Cell Abs: 1124 /uL (ref 400–1790)

## 2023-08-17 LAB — HIV-1 RNA QUANT-NO REFLEX-BLD

## 2023-08-17 LAB — COMPREHENSIVE METABOLIC PANEL
ALT: 9 [IU]/L (ref 0–32)
AST: 10 [IU]/L (ref 0–40)
Albumin: 4.4 g/dL (ref 3.8–4.9)
Alkaline Phosphatase: 112 [IU]/L (ref 44–121)
BUN/Creatinine Ratio: 8 — ABNORMAL LOW (ref 9–23)
BUN: 10 mg/dL (ref 6–24)
Bilirubin Total: 0.3 mg/dL (ref 0.0–1.2)
CO2: 25 mmol/L (ref 20–29)
Calcium: 9.7 mg/dL (ref 8.7–10.2)
Chloride: 103 mmol/L (ref 96–106)
Creatinine, Ser: 1.21 mg/dL — ABNORMAL HIGH (ref 0.57–1.00)
Globulin, Total: 3.1 g/dL (ref 1.5–4.5)
Glucose: 85 mg/dL (ref 70–99)
Potassium: 3.7 mmol/L (ref 3.5–5.2)
Sodium: 143 mmol/L (ref 134–144)
Total Protein: 7.5 g/dL (ref 6.0–8.5)
eGFR: 52 mL/min/{1.73_m2} — ABNORMAL LOW (ref >59)

## 2023-08-17 LAB — CBC
Hematocrit: 42.9 % (ref 34.0–46.6)
Hemoglobin: 13.9 g/dL (ref 11.1–15.9)
MCH: 28.5 pg (ref 26.6–33.0)
MCHC: 32.4 g/dL (ref 31.5–35.7)
MCV: 88 fL (ref 79–97)
Platelets: 329 10*3/uL (ref 150–450)
RBC: 4.87 x10E6/uL (ref 3.77–5.28)
RDW: 14.5 % (ref 11.7–15.4)
WBC: 7.5 10*3/uL (ref 3.4–10.8)

## 2023-08-17 LAB — LIPID PANEL
Chol/HDL Ratio: 4.3 {ratio} (ref 0.0–4.4)
Cholesterol, Total: 213 mg/dL — ABNORMAL HIGH (ref 100–199)
HDL: 50 mg/dL (ref >39)
LDL Chol Calc (NIH): 144 mg/dL — ABNORMAL HIGH (ref 0–99)
Triglycerides: 108 mg/dL (ref 0–149)
VLDL Cholesterol Cal: 19 mg/dL (ref 5–40)

## 2023-08-17 LAB — RPR: RPR Ser Ql: NONREACTIVE

## 2023-08-23 ENCOUNTER — Telehealth: Payer: Self-pay | Admitting: Infectious Diseases

## 2023-08-23 NOTE — Telephone Encounter (Signed)
Called pt and left VM regarding her son.  Will try again.

## 2023-08-25 ENCOUNTER — Other Ambulatory Visit: Payer: Self-pay

## 2023-08-25 NOTE — Progress Notes (Signed)
Specialty Pharmacy Refill Coordination Note  MCKINNA MAPES is a 57 y.o. female contacted today regarding refills of specialty medication(s) Dolutegravir-lamiVUDine (Dovato)   Patient requested Delivery   Delivery date: 09/07/23   Verified address: [address] - [place], [place] [address]   Medication will be filled on 12.30.24.

## 2023-08-28 ENCOUNTER — Other Ambulatory Visit: Payer: Self-pay | Admitting: Infectious Diseases

## 2023-08-28 DIAGNOSIS — F329 Major depressive disorder, single episode, unspecified: Secondary | ICD-10-CM

## 2023-08-29 ENCOUNTER — Telehealth: Payer: Self-pay | Admitting: *Deleted

## 2023-08-29 NOTE — Progress Notes (Unsigned)
Complex Care Management Note Care Guide Note  08/29/2023 Name: [name] MRN: [id number] DOB: 1965-12-03   Complex Care Management Outreach Attempts: An unsuccessful telephone outreach was attempted today to offer the patient information about available complex care management services.  Follow Up Plan:  Additional outreach attempts will be made to offer the patient complex care management information and services.   Encounter Outcome:  No Answer  Burman Nieves, CCMA Care Coordination Care Guide Direct Dial: [phone]

## 2023-09-04 ENCOUNTER — Other Ambulatory Visit: Payer: Self-pay

## 2023-09-05 NOTE — Progress Notes (Signed)
 Complex Care Management Note Care Guide Note  09/05/2023 Name: [name] MRN: [id number] DOB: [birth year]   Complex Care Management Outreach Attempts: A third unsuccessful outreach was attempted today to offer the patient with information about available complex care management services.  Follow Up Plan:  No further outreach attempts will be made at this time. We have been unable to contact the patient to offer or enroll patient in complex care management services.  Encounter Outcome:  No Answer  Thedford Franks, CCMA Care Coordination Care Guide Direct Dial: [phone]

## 2023-09-11 ENCOUNTER — Telehealth: Payer: Self-pay | Admitting: *Deleted

## 2023-09-11 NOTE — Progress Notes (Signed)
 Complex Care Management Note  Care Guide Note 09/11/2023 Name: [name] MRN: [id number] DOB: 11-17-1965  [name] is a 58 y.o. year old female who sees Nafziger, Darleene, NP for primary care. I reached out to Jon JONELLE Burrs by phone today to offer complex care management services.  [name] was given information about Complex Care Management services today including:   The Complex Care Management services include support from the care team which includes your Nurse Coordinator, Clinical Social Worker, or Pharmacist.  The Complex Care Management team is here to help remove barriers to the health concerns and goals most important to you. Complex Care Management services are voluntary, and the patient may decline or stop services at any time by request to their care team member.   Complex Care Management Consent Status: Patient agreed to services and verbal consent obtained.   Follow up plan:  Telephone appointment with complex care management team member scheduled for:  09/18/2023  Encounter Outcome:  Patient Scheduled  Thedford Franks, [facility] Care Coordination Care Guide Direct Dial: [phone]

## 2023-09-13 NOTE — Telephone Encounter (Signed)
 Left VM. "Hope she is doing ok"

## 2023-09-18 ENCOUNTER — Ambulatory Visit: Payer: Self-pay | Admitting: Licensed Clinical Social Worker

## 2023-09-21 NOTE — Patient Instructions (Signed)
Visit Information  Thank you for taking time to visit with me today. Please don't hesitate to contact me if I can be of assistance to you.   Following are the goals we discussed today:   Goals Addressed             This Visit's Progress    Obtain Supportive Resources-Stress Management   On track    Activities and task to complete in order to accomplish goals.   Keep all upcoming appointments discussed today Continue with compliance of taking medication prescribed by Doctor Implement healthy coping skills discussed to assist with management of symptoms         Our next appointment is by telephone on 1/27 at 3  Please call the care guide team at [phone] if you need to cancel or reschedule your appointment.   If you are experiencing a Mental Health or Behavioral Health Crisis or need someone to talk to, please call the Suicide and Crisis Lifeline: 988 call 911   Patient verbalizes understanding of instructions and care plan provided today and agrees to view in MyChart. Active MyChart status and patient understanding of how to access instructions and care plan via MyChart confirmed with patient.     Windy Fast [facility]  [facility], [facility] Clinical Social Worker Direct Dial: [phone]  Fax: [phone] Website: [email or web address] 10:51 PM

## 2023-09-21 NOTE — Patient Outreach (Signed)
  Care Coordination   Initial Visit Note   09/18/2023 Name: [name] MRN: [id number] DOB: 04/23/1966  [name] is a 58 y.o. year old female who sees Nafziger, Kandee Keen, NP for primary care. I spoke with  Algernon Huxley by phone today.  What matters to the patients health and wellness today?  Symptom Management    Goals Addressed             This Visit's Progress    Obtain Supportive Resources-Stress Management   On track    Activities and task to complete in order to accomplish goals.   Keep all upcoming appointments discussed today Continue with compliance of taking medication prescribed by Doctor Implement healthy coping skills discussed to assist with management of symptoms         SDOH assessments and interventions completed:  Yes  SDOH Interventions Today    Flowsheet Row Most Recent Value  SDOH Interventions   Food Insecurity Interventions Intervention Not Indicated  Housing Interventions Intervention Not Indicated  Transportation Interventions Intervention Not Indicated  Utilities Interventions Intervention Not Indicated        Care Coordination Interventions:  Yes, provided  Interventions Today    Flowsheet Row Most Recent Value  Chronic Disease   Chronic disease during today's visit Other  [Anxiety and Depression]  General Interventions   General Interventions Discussed/Reviewed General Interventions Discussed, Programmer, applications, Doctor Visits  Doctor Visits Discussed/Reviewed Doctor Visits Discussed  Mental Health Interventions   Mental Health Discussed/Reviewed Mental Health Discussed, Coping Strategies, Anxiety, Depression  [Stress]  Nutrition Interventions   Nutrition Discussed/Reviewed Nutrition Discussed  Pharmacy Interventions   Pharmacy Dicussed/Reviewed Pharmacy Topics Discussed, Medication Adherence  Safety Interventions   Safety Discussed/Reviewed Safety Discussed       Follow up plan: Follow up call scheduled for 2-4 weeks     Encounter Outcome:  Patient Visit Completed   Jenel Lucks, LCSW [place]  [facility], [facility] Clinical Social Worker Direct Dial: [phone]  Fax: [phone] Website: [email or web address] 10:51 PM

## 2023-09-26 ENCOUNTER — Other Ambulatory Visit: Payer: Self-pay

## 2023-09-27 ENCOUNTER — Ambulatory Visit: Payer: BC Managed Care – PPO | Admitting: Orthopaedic Surgery

## 2023-10-02 ENCOUNTER — Ambulatory Visit: Payer: Self-pay | Admitting: Licensed Clinical Social Worker

## 2023-10-02 NOTE — Patient Instructions (Signed)
Visit Information  Thank you for taking time to visit with me today. Please don't hesitate to contact me if I can be of assistance to you.   Following are the goals we discussed today:   Goals Addressed             This Visit's Progress    Obtain Supportive Resources-Stress Management   On track    Activities and task to complete in order to accomplish goals.   Keep all upcoming appointments discussed today Continue with compliance of taking medication prescribed by Doctor Implement healthy coping skills discussed to assist with management of symptoms Review counseling resources provided via email         Our next appointment is by telephone on 2/17 at 3 PM  Please call the care guide team at [phone] if you need to cancel or reschedule your appointment.   If you are experiencing a Mental Health or Behavioral Health Crisis or need someone to talk to, please call the Suicide and Crisis Lifeline: 988 call 911   Patient verbalizes understanding of instructions and care plan provided today and agrees to view in MyChart. Active MyChart status and patient understanding of how to access instructions and care plan via MyChart confirmed with patient.     Windy Fast [facility]  [facility], [facility] Clinical Social Worker Direct Dial: [phone]  Fax: [phone] Website: [email or web address] 5:31 PM

## 2023-10-02 NOTE — Patient Outreach (Signed)
  Care Coordination   Follow Up Visit Note   10/02/2023 Name: [name] MRN: [id number] DOB: [birth year]  [name] is a 58 y.o. year old female who sees Nafziger, Kandee Keen, NP for primary care. I spoke with  Algernon Huxley by phone today.  What matters to the patients health and wellness today?  Symptom Management and Supportive Resources    Goals Addressed             This Visit's Progress    Obtain Supportive Resources-Stress Management   On track    Activities and task to complete in order to accomplish goals.   Keep all upcoming appointments discussed today Continue with compliance of taking medication prescribed by Doctor Implement healthy coping skills discussed to assist with management of symptoms Review counseling resources provided via email         SDOH assessments and interventions completed:  No     Care Coordination Interventions:  Yes, provided  Interventions Today    Flowsheet Row Most Recent Value  Chronic Disease   Chronic disease during today's visit --  [Anxiety and Depression]  General Interventions   General Interventions Discussed/Reviewed General Interventions Reviewed, Doctor Visits  Doctor Visits Discussed/Reviewed Doctor Visits Reviewed  Mental Health Interventions   Mental Health Discussed/Reviewed Mental Health Reviewed, Coping Strategies, Anxiety, Depression  [Sleep hygiene discussed/reviewed. Pt is implementing healthy coping skills and boundaries to promote symptom management]       Follow up plan: Follow up call scheduled for 2-4 weeks    Encounter Outcome:  Patient Visit Completed   Jenel Lucks, LCSW [place]  [facility], [facility] Clinical Social Worker Direct Dial: [phone]  Fax: [phone] Website: [email or web address] 5:30 PM

## 2023-10-04 ENCOUNTER — Encounter: Payer: Self-pay | Admitting: Orthopaedic Surgery

## 2023-10-04 ENCOUNTER — Ambulatory Visit (INDEPENDENT_AMBULATORY_CARE_PROVIDER_SITE_OTHER): Payer: BC Managed Care – PPO | Admitting: Orthopaedic Surgery

## 2023-10-04 DIAGNOSIS — M7662 Achilles tendinitis, left leg: Secondary | ICD-10-CM

## 2023-10-04 MED ORDER — METHYLPREDNISOLONE 4 MG PO TABS
ORAL_TABLET | ORAL | 0 refills | Status: DC
Start: 2023-10-04 — End: 2024-01-03

## 2023-10-04 NOTE — Progress Notes (Signed)
Office Visit Note   Patient: [name]           Date of Birth: 21-Dec-1965           MRN: [id number] Visit Date: 10/04/2023              Requested by: Ginnie Smart, MD [address] [place],  [place] [address] PCP: Shirline Frees, NP   Assessment & Plan: Visit Diagnoses:  1. Achilles tendinitis, left leg     Plan: She is shown gastrocsoleus stretching exercises and a runner stretch handout is given.  We talked about shoe wear she is to avoid open back shoes, she should either have a back or arch strap and preferably a bit of a heel.  Will send her for shockwave therapy to her Achilles insertion on the left with Dr. Shon Baton.  Have her take a Medrol Dosepak no NSAIDs while on the Dosepak.  Begin using 2 g of Voltaren gel 4 times daily to the area of maximal tenderness left heel.  For the next 2 weeks she will wear 916's heel lifts which were given.  Follow-up with [place] in 6 weeks see how she is doing.  Questions were encouraged and answered by Dr. Magnus Ivan and myself. Radiographs left foot dated 4/4 24 are personally reviewed.  Show no acute fractures noted significant arthropathy.  Haglund's and plantar calcaneus heel spur present.  Otherwise no bony abnormalities.  Follow-Up Instructions: Return in about 6 weeks (around 11/15/2023).   Orders:  No orders of the defined types were placed in this encounter.  Meds ordered this encounter  Medications   methylPREDNISolone (MEDROL) 4 MG tablet    Sig: Take as directed    Dispense:  21 tablet    Refill:  0      Procedures: No procedures performed   Clinical Data: No additional findings.   Subjective: Chief Complaint  Patient presents with   Left Heel - Pain    HPI [name] 58 year old female were seen for the first time for left heel pain has been ongoing for almost a year.  She states she is unable to wear shoes with backs due to the left heel pain.  With stabbing intermittent pain can be as bad as 8 out of 10.   She has tried Tylenol.  She wears mostly open back shoes.  No known injury.  She is nondiabetic. Review of Systems See HPI otherwise negative  Objective: Vital Signs: There were no vitals taken for this visit.  Physical Exam Constitutional:      Appearance: She is not ill-appearing or diaphoretic.  Cardiovascular:     Pulses: Normal pulses.  Pulmonary:     Effort: Pulmonary effort is normal.  Neurological:     Mental Status: She is alert and oriented to person, place, and time.  Psychiatric:        Mood and Affect: Mood normal.     Ortho Exam Bilateral feet/ankles: Dorsal pedal pulse 2+ and equal symmetric.  No rashes skin lesions ulcerations ecchymosis or erythema either foot or ankle.  Slight edema at the left Achilles insertion.  Maximal tenderness over the left Achilles insertion region.  Thompson test negative bilaterally.  5 out of 5 strength with inversion eversion against resistance bilaterally.  Nontender over the posterior tibial tendons and peroneal tendons bilaterally.  Nontender to sinus Tarsi region bilaterally.  No tenderness over the medial tubercle of the calcaneus bilaterally.  Tight gastrocs bilaterally.  Good range of  motion bilateral ankles.  Specialty Comments:  No specialty comments available.  Imaging: No results found.   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   ANEMIA-NOS 10/27/2006   Human immunodeficiency virus (HIV) disease (HCC) 06/15/2006   Essential hypertension 06/15/2006   Insomnia 06/15/2006   Past Medical History:  Diagnosis Date   Allergy    Anxiety    HIV (human immunodeficiency virus  infection) (HCC)    Hypertension    Insomnia    Migraine     Family History  Problem Relation Age of Onset   Cerebral aneurysm Father 62   Aortic aneurysm Father    Diabetes Other    Hypertension Other    Brain cancer Other    Breast cancer Neg Hx    Colon cancer Neg Hx    Esophageal cancer Neg Hx    Stomach cancer Neg Hx    Rectal cancer Neg Hx     Past Surgical History:  Procedure Laterality Date   ABDOMINAL HYSTERECTOMY Right 2004   partial   Social History   Occupational History   Occupation: Environmental health practitioner    Employer: [employer]  Tobacco Use   Smoking status: Never   Smokeless tobacco: Never  Vaping Use   Vaping status: Never Used  Substance and Sexual Activity   Alcohol use: Yes    Alcohol/week: 0.0 standard drinks of alcohol    Comment: socially   Drug use: No   Sexual activity: Yes    Partners: Male    Birth control/protection: Abstinence, Surgical

## 2023-10-18 ENCOUNTER — Other Ambulatory Visit: Payer: Self-pay

## 2023-10-18 NOTE — Progress Notes (Signed)
Specialty Pharmacy Refill Coordination Note  [name] is a 58 y.o. female contacted today regarding refills of specialty medication(s) Dolutegravir-lamiVUDine (Dovato)   Patient requested Delivery   Delivery date: 10/20/23   Verified address: [address]  [place] [address]   Medication will be filled on 10/19/23.

## 2023-10-19 ENCOUNTER — Other Ambulatory Visit: Payer: Self-pay

## 2023-10-19 NOTE — Progress Notes (Signed)
Correction on delivery Address, medication will be delivered to [address] [name] [address]

## 2023-10-23 ENCOUNTER — Ambulatory Visit: Payer: Self-pay | Admitting: Licensed Clinical Social Worker

## 2023-10-23 NOTE — Patient Outreach (Signed)
  Care Coordination   Follow Up Visit Note   10/23/2023 Name: HANEEFAH VENTURINI MRN: [id number] DOB: February 11, 1966  RENESHA LIZAMA is a 58 y.o. year old female who sees Nafziger, Kandee Keen, NP for primary care. I spoke with  Algernon Huxley by phone today.  What matters to the patients health and wellness today?  Symptom Management    Goals Addressed             This Visit's Progress    Obtain Supportive Resources-Stress Management   On track    Activities and task to complete in order to accomplish goals.   Keep all upcoming appointments discussed today Continue with compliance of taking medication prescribed by Doctor Implement healthy coping skills discussed to assist with management of symptoms Review counseling resources provided via email         SDOH assessments and interventions completed:  No     Care Coordination Interventions:  Yes, provided  Interventions Today    Flowsheet Row Most Recent Value  Chronic Disease   Chronic disease during today's visit Other  [Anxiety]  General Interventions   General Interventions Discussed/Reviewed General Interventions Reviewed, Doctor Visits  Doctor Visits Discussed/Reviewed Doctor Visits Reviewed  Mental Health Interventions   Mental Health Discussed/Reviewed Mental Health Reviewed, Coping Strategies, Anxiety       Follow up plan: Follow up call scheduled for 6-8 weeks    Encounter Outcome:  Patient Visit Completed   Jenel Lucks, LCSW [place]  [facility], [facility] Clinical Social Worker Direct Dial: [phone]  Fax: [phone] Website: [email or web address] 3:57 PM

## 2023-10-23 NOTE — Patient Instructions (Signed)
Visit Information  Thank you for taking time to visit with me today. Please don't hesitate to contact me if I can be of assistance to you.   Following are the goals we discussed today:   Goals Addressed             This Visit's Progress    Obtain Supportive Resources-Stress Management   On track    Activities and task to complete in order to accomplish goals.   Keep all upcoming appointments discussed today Continue with compliance of taking medication prescribed by Doctor Implement healthy coping skills discussed to assist with management of symptoms Review counseling resources provided via email         Our next appointment is by telephone on 3/31 at 3:00 PM  Please call the care guide team at [phone] if you need to cancel or reschedule your appointment.   If you are experiencing a Mental Health or Behavioral Health Crisis or need someone to talk to, please call the Suicide and Crisis Lifeline: 988 call 911   Patient verbalizes understanding of instructions and care plan provided today and agrees to view in MyChart. Active MyChart status and patient understanding of how to access instructions and care plan via MyChart confirmed with patient.     Windy Fast [facility]  [facility], [facility] Clinical Social Worker Direct Dial: [phone]  Fax: [phone] Website: [email or web address] 3:57 PM

## 2023-11-08 ENCOUNTER — Ambulatory Visit (INDEPENDENT_AMBULATORY_CARE_PROVIDER_SITE_OTHER): Payer: BC Managed Care – PPO | Admitting: Adult Health

## 2023-11-08 ENCOUNTER — Encounter: Payer: Self-pay | Admitting: Sports Medicine

## 2023-11-08 ENCOUNTER — Other Ambulatory Visit (HOSPITAL_COMMUNITY): Payer: Self-pay

## 2023-11-08 ENCOUNTER — Ambulatory Visit: Payer: BC Managed Care – PPO | Admitting: Sports Medicine

## 2023-11-08 ENCOUNTER — Encounter: Payer: Self-pay | Admitting: Adult Health

## 2023-11-08 ENCOUNTER — Ambulatory Visit: Payer: BC Managed Care – PPO | Admitting: Orthopaedic Surgery

## 2023-11-08 VITALS — BP 118/74 | HR 74 | Temp 97.9°F | Ht 67.0 in | Wt 229.0 lb

## 2023-11-08 DIAGNOSIS — I10 Essential (primary) hypertension: Secondary | ICD-10-CM

## 2023-11-08 DIAGNOSIS — M7732 Calcaneal spur, left foot: Secondary | ICD-10-CM

## 2023-11-08 DIAGNOSIS — E66811 Obesity, class 1: Secondary | ICD-10-CM

## 2023-11-08 DIAGNOSIS — M7662 Achilles tendinitis, left leg: Secondary | ICD-10-CM | POA: Diagnosis not present

## 2023-11-08 DIAGNOSIS — Z0000 Encounter for general adult medical examination without abnormal findings: Secondary | ICD-10-CM | POA: Diagnosis not present

## 2023-11-08 DIAGNOSIS — M9262 Juvenile osteochondrosis of tarsus, left ankle: Secondary | ICD-10-CM

## 2023-11-08 DIAGNOSIS — E782 Mixed hyperlipidemia: Secondary | ICD-10-CM | POA: Diagnosis not present

## 2023-11-08 DIAGNOSIS — F411 Generalized anxiety disorder: Secondary | ICD-10-CM | POA: Diagnosis not present

## 2023-11-08 DIAGNOSIS — M2141 Flat foot [pes planus] (acquired), right foot: Secondary | ICD-10-CM

## 2023-11-08 DIAGNOSIS — M2142 Flat foot [pes planus] (acquired), left foot: Secondary | ICD-10-CM

## 2023-11-08 DIAGNOSIS — B20 Human immunodeficiency virus [HIV] disease: Secondary | ICD-10-CM | POA: Diagnosis not present

## 2023-11-08 LAB — COMPREHENSIVE METABOLIC PANEL
ALT: 7 U/L (ref 0–35)
AST: 13 U/L (ref 0–37)
Albumin: 4 g/dL (ref 3.5–5.2)
Alkaline Phosphatase: 89 U/L (ref 39–117)
BUN: 10 mg/dL (ref 6–23)
CO2: 31 meq/L (ref 19–32)
Calcium: 9.1 mg/dL (ref 8.4–10.5)
Chloride: 106 meq/L (ref 96–112)
Creatinine, Ser: 1.13 mg/dL (ref 0.40–1.20)
GFR: 54.05 mL/min — ABNORMAL LOW (ref >60.00)
Glucose, Bld: 92 mg/dL (ref 70–99)
Potassium: 3.5 meq/L (ref 3.5–5.1)
Sodium: 142 meq/L (ref 135–145)
Total Bilirubin: 0.5 mg/dL (ref 0.2–1.2)
Total Protein: 7.1 g/dL (ref 6.0–8.3)

## 2023-11-08 LAB — TSH: TSH: 1.16 u[IU]/mL (ref 0.35–5.50)

## 2023-11-08 LAB — LIPID PANEL
Cholesterol: 194 mg/dL (ref 0–200)
HDL: 44.3 mg/dL (ref >39.00)
LDL Cholesterol: 135 mg/dL — ABNORMAL HIGH (ref 0–99)
NonHDL: 149.49
Total CHOL/HDL Ratio: 4
Triglycerides: 72 mg/dL (ref 0.0–149.0)
VLDL: 14.4 mg/dL (ref 0.0–40.0)

## 2023-11-08 LAB — CBC WITH DIFFERENTIAL/PLATELET
Basophils Absolute: 0 10*3/uL (ref 0.0–0.1)
Basophils Relative: 0.4 % (ref 0.0–3.0)
Eosinophils Absolute: 0.1 10*3/uL (ref 0.0–0.7)
Eosinophils Relative: 1.2 % (ref 0.0–5.0)
HCT: 39.6 % (ref 36.0–46.0)
Hemoglobin: 12.8 g/dL (ref 12.0–15.0)
Lymphocytes Relative: 39 % (ref 12.0–46.0)
Lymphs Abs: 1.7 10*3/uL (ref 0.7–4.0)
MCHC: 32.3 g/dL (ref 30.0–36.0)
MCV: 88.2 fl (ref 78.0–100.0)
Monocytes Absolute: 0.3 10*3/uL (ref 0.1–1.0)
Monocytes Relative: 7.4 % (ref 3.0–12.0)
Neutro Abs: 2.3 10*3/uL (ref 1.4–7.7)
Neutrophils Relative %: 52 % (ref 43.0–77.0)
Platelets: 298 10*3/uL (ref 150.0–400.0)
RBC: 4.48 Mil/uL (ref 3.87–5.11)
RDW: 15 % (ref 11.5–15.5)
WBC: 4.5 10*3/uL (ref 4.0–10.5)

## 2023-11-08 MED ORDER — HYDROCHLOROTHIAZIDE 25 MG PO TABS
ORAL_TABLET | ORAL | 3 refills | Status: DC
Start: 2023-11-08 — End: 2023-11-17
  Filled 2023-11-08: qty 30, 30d supply, fill #0

## 2023-11-08 NOTE — Progress Notes (Signed)
 [name] - 58 y.o. female MRN [id number]  Date of birth: Jun 19, 1966  Office Visit Note: Visit Date: 11/08/2023 PCP: Shirline Frees, NP Referred by: Kirtland Bouchard, PA-C  Subjective: Chief Complaint  Patient presents with   Left Ankle - Pain   HPI: [name] is a pleasant 58 y.o. female who presents today for evaluation of left Achilles and posterior heel pain.  She has had posterior Achilles and heel pain which has been intermittent for a few months.  She was shown gastroc and Achilles stretching which she has done which has helped some.  She completed the Medrol Dosepak at the end of January which was mildly helpful.  She uses Voltaren gel.  She has been in heel lifts and has noticed improvement with certain shoewear modifications--avoiding shoes with firm heel cup.  Pertinent ROS were reviewed with the patient and found to be negative unless otherwise specified above in HPI.   Assessment & Plan: Visit Diagnoses:  1. Haglund deformity of left heel   2. Achilles tendinitis, left leg   3. Heel spur, left   4. Pes planus of both feet    Plan: Impression is acute on chronic posterior heel and Achilles pain which is intermittent and multifactorial.  She does have both a Haglund deformity as well as superior enthesophyte spur and plantar calcaneal spur.  Has distal Achilles tendinitis with a mild contracture which is somewhat improving with her stretching exercises, I would like her to continue this on a daily basis.  For both the Achilles and her heel spurs we did perform extracorporeal shockwave therapy.  I would like to see her back next week for repeat treatment and evaluation and see what sort of cumulative benefit she has going forward.  She will remain in her heel lifts with regular tennis shoes, discussed open back shoes to avoid irritation of the Haglund deformity.  Given her mild flatfoot, discussed supportive shoe wear, Birkenstock shoes if wearing sandals.  She could  be a candidate for an orthotic or shoe insole but we will hold on this for now.  Follow-up next week.  Follow-up: Return in about 1 week (around 11/15/2023) for for achilles/heel (R).   Meds & Orders: No orders of the defined types were placed in this encounter.  No orders of the defined types were placed in this encounter.    Procedures: Procedure: ECSWT Indications:  Achilles tendinits, heel spur    Procedure Details Consent: Risks of procedure as well as the alternatives and risks of each were explained to the patient.  Verbal consent for procedure obtained. Time Out: Verified patient identification, verified procedure, site was marked, verified correct patient position. The area was cleaned with alcohol swab.     The left distal achilles and calcaneus was targeted for Extracorporeal shockwave therapy.    Preset: Achillodynia, Heel spur Power Level: 90 mJ Frequency: 10 Hz Impulse/cycles: 2000 achilles (1000 heel spur) Head size: Regular   Patient tolerated procedure well without immediate complications.       Clinical History: No specialty comments available.  She reports that she has never smoked. She has never used smokeless tobacco. No results for input(s): "HGBA1C", "LABURIC" in the last 8760 hours.  Objective:    Physical Exam  Gen: Well-appearing, in no acute distress; non-toxic CV: Well-perfused. Warm.  Resp: Breathing unlabored on room air; no wheezing. Psych: Fluid speech in conversation; appropriate affect; normal thought process  Ortho Exam - Left ankle/foot: + Haglund deformity present,  mildly tender to palpation.  There is no redness swelling or effusion in this location.  There is a mild Achilles contracture at about 95 degrees, full range plantarflexion.  There is mild tenderness at the distal Achilles insertion, no thickening or palpable tendinosis extending proximally.  There is mild pes planus upon standing with loss of longitudinal and transverse arch of  the foot.  Imaging:  *Independent review and interpretation of three-view x-ray of the left foot from 4//24 was performed by myself today.  X-rays demonstrate mild to moderate plantar heel spur as well as enthesophyte spur off the superior calcaneus.  There is a Haglund deformity present.  There is no significant arthritic change about the foot or ankle joint.  Narrative & Impression  CLINICAL DATA:  Heel pain   EXAM: LEFT FOOT - COMPLETE 3+ VIEW   COMPARISON:  None Available.   FINDINGS: There is no evidence of fracture or dislocation. There is no evidence of arthropathy or other focal bone abnormality. Soft tissues are unremarkable.   IMPRESSION: Posterior and plantar calcaneal spurs. No acute osseous abnormalities.     Electronically Signed   By: Layla Maw M.D.   On: 12/11/2022 13:32    Past Medical/Family/Surgical/Social History: Medications & Allergies reviewed per EMR, new medications updated. 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   ANEMIA-NOS 10/27/2006   Human immunodeficiency virus (HIV) disease (HCC) 06/15/2006   Essential hypertension 06/15/2006   Insomnia 06/15/2006   Past Medical History:  Diagnosis Date   Allergy    Anxiety    HIV (human immunodeficiency virus infection) (HCC)    Hypertension    Insomnia    Migraine    Family History  Problem Relation Age of Onset   Cerebral aneurysm Father 41   Aortic aneurysm Father    Diabetes Other    Hypertension Other    Brain cancer Other    Breast cancer Neg Hx    Colon cancer Neg Hx    Esophageal cancer Neg Hx    Stomach cancer Neg Hx    Rectal cancer Neg Hx     Past Surgical History:  Procedure Laterality Date   ABDOMINAL HYSTERECTOMY Right 2004   partial   Social History   Occupational History   Occupation: Environmental health practitioner    Employer: [employer]  Tobacco Use   Smoking status: Never   Smokeless tobacco: Never  Vaping Use   Vaping status: Never Used  Substance and Sexual Activity   Alcohol use: Yes    Alcohol/week: 0.0 standard drinks of alcohol    Comment: socially   Drug use: No   Sexual activity: Yes    Partners: Male    Birth control/protection: Abstinence, Surgical

## 2023-11-08 NOTE — Progress Notes (Signed)
 Subjective:    Patient ID: [name], female    DOB: 02/04/1966, 58 y.o.   MRN: [id number]  HPI Patient presents for yearly preventative medicine examination.She is a pleasant 58 year old female who  has a past medical history of Allergy, Anxiety, HIV (human immunodeficiency virus infection) (HCC), Hypertension, Insomnia, and Migraine.  Hypertension-is currently prescribed HCTZ 25 mg daily.  She denies dizziness, lightheadedness, chest pain, shortness of breath. She does check her BP at home with readings in the 118-120 range.  BP Readings from Last 3 Encounters:  11/08/23 118/74  08/15/23 (!) 142/103  12/08/22 120/82   HIV disease-followed by infectious disease.  Currently managed with Dovato 50-300 mg  Anxiety-situational in nature. Takes Klonopin as needed  Hyperlipidemia - not currently on medication  Lab Results  Component Value Date   CHOL 213 (H) 08/15/2023   HDL 50 08/15/2023   LDLCALC 144 (H) 08/15/2023   TRIG 108 08/15/2023   CHOLHDL 4.3 08/15/2023   All immunizations and health maintenance protocols were reviewed with the patient and needed orders were placed.  Appropriate screening laboratory values were ordered for the patient including screening of hyperlipidemia, renal function and hepatic function. If indicated by BPH, a PSA was ordered.  Medication reconciliation,  past medical history, social history, problem list and allergies were reviewed in detail with the patient  Goals were established with regard to weight loss, exercise, and  diet in compliance with medications  Wt Readings from Last 3 Encounters:  11/08/23 229 lb (103.9 kg)  08/15/23 230 lb 3.2 oz (104.4 kg)  12/08/22 228 lb (103.4 kg)   She is up to date on routine colon cancer screening, mammograms and has her PAP scheduled.   Review of Systems  Constitutional: Negative.   HENT: Negative.    Eyes: Negative.   Respiratory: Negative.    Cardiovascular: Negative.   Gastrointestinal:  Negative.   Endocrine: Negative.   Genitourinary: Negative.   Musculoskeletal: Negative.   Skin: Negative.   Allergic/Immunologic: Negative.   Neurological: Negative.   Hematological: Negative.   Psychiatric/Behavioral: Negative.     Past Medical History:  Diagnosis Date   Allergy    Anxiety    HIV (human immunodeficiency virus infection) (HCC)    Hypertension    Insomnia    Migraine     Social History   Socioeconomic History   Marital status: Single    Spouse name: Not on file   Number of children: 1   Years of education: Not on file   Highest education level: Not on file  Occupational History   Occupation: Environmental health practitioner    Employer: [employer]  Tobacco Use   Smoking status: Never   Smokeless tobacco: Never  Vaping Use   Vaping status: Never Used  Substance and Sexual Activity   Alcohol use: Yes    Alcohol/week: 0.0 standard drinks of alcohol    Comment: socially   Drug use: No   Sexual activity: Yes    Partners: Male    Birth control/protection: Abstinence, Surgical  Other Topics Concern   Not on file  Social History Narrative   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    Social Drivers of Corporate investment banker Strain: Not on file  Food Insecurity: No Food Insecurity (09/18/2023)   Hunger Vital Sign  Worried About Programme researcher, broadcasting/film/video in the Last Year: Never true    Ran Out of Food in the Last Year: Never true  Transportation Needs: No Transportation Needs (09/18/2023)   PRAPARE - Administrator, Civil Service (Medical): No    Lack of Transportation (Non-Medical): No  Physical Activity: Not on file  Stress: Not on file  Social Connections: Not on file  Intimate Partner Violence: Not At Risk (09/18/2023)   Humiliation, Afraid, Rape, and Kick questionnaire    Fear of Current or Ex-Partner: No    Emotionally Abused:  No    Physically Abused: No    Sexually Abused: No    Past Surgical History:  Procedure Laterality Date   ABDOMINAL HYSTERECTOMY Right 2004   partial    Family History  Problem Relation Age of Onset   Cerebral aneurysm Father 46   Aortic aneurysm Father    Diabetes Other    Hypertension Other    Brain cancer Other    Breast cancer Neg Hx    Colon cancer Neg Hx    Esophageal cancer Neg Hx    Stomach cancer Neg Hx    Rectal cancer Neg Hx     Allergies  Allergen Reactions   Iodinated Contrast Media Other (See Comments)    Pain in my eyes .    Sulfonamide Derivatives Hives    Current Outpatient Medications on File Prior to Visit  Medication Sig Dispense Refill   cholecalciferol (VITAMIN D3) 25 MCG (1000 UNIT) tablet Take 1,000 Units by mouth daily.     clonazePAM (KLONOPIN) 0.5 MG tablet Take 1 tablet (0.5 mg total) by mouth daily. 30 tablet 2   cyanocobalamin 100 MCG tablet Take 1 tablet by mouth daily.     dolutegravir-lamiVUDine (DOVATO) 50-300 MG tablet Take 1 tablet by mouth daily. 30 tablet 11   methylPREDNISolone (MEDROL) 4 MG tablet Take as directed 21 tablet 0   Probiotic Product (PROBIOTIC BLEND PO) Take 1 tablet by mouth.     No current facility-administered medications on file prior to visit.    BP 118/74   Pulse 74   Temp 97.9 F (36.6 C) (Oral)   Ht 5\' 7"  (1.702 m)   Wt 229 lb (103.9 kg)   SpO2 100%   BMI 35.87 kg/m       Objective:   Physical Exam Vitals and nursing note reviewed.  Constitutional:      General: She is not in acute distress.    Appearance: Normal appearance. She is obese. She is not ill-appearing.  HENT:     Head: Normocephalic and atraumatic.     Right Ear: Tympanic membrane, ear canal and external ear normal. There is no impacted cerumen.     Left Ear: Tympanic membrane, ear canal and external ear normal. There is no impacted cerumen.     Nose: Nose normal. No congestion or rhinorrhea.     Mouth/Throat:     Mouth: Mucous  membranes are moist.     Pharynx: Oropharynx is clear.  Eyes:     Extraocular Movements: Extraocular movements intact.     Conjunctiva/sclera: Conjunctivae normal.     Pupils: Pupils are equal, round, and reactive to light.  Neck:     Vascular: No carotid bruit.  Cardiovascular:     Rate and Rhythm: Normal rate and regular rhythm.     Pulses: Normal pulses.     Heart sounds: No murmur heard.    No friction rub. No  gallop.  Pulmonary:     Effort: Pulmonary effort is normal.     Breath sounds: Normal breath sounds.  Abdominal:     General: Abdomen is flat. Bowel sounds are normal. There is no distension.     Palpations: Abdomen is soft. There is no mass.     Tenderness: There is no abdominal tenderness. There is no guarding or rebound.     Hernia: No hernia is present.  Musculoskeletal:        General: Normal range of motion.     Cervical back: Normal range of motion and neck supple.  Lymphadenopathy:     Cervical: No cervical adenopathy.  Skin:    General: Skin is warm and dry.     Capillary Refill: Capillary refill takes less than 2 seconds.  Neurological:     General: No focal deficit present.     Mental Status: She is alert and oriented to person, place, and time.  Psychiatric:        Mood and Affect: Mood normal.        Behavior: Behavior normal.        Thought Content: Thought content normal.        Judgment: Judgment normal.        Assessment & Plan:  1. Routine general medical examination at a health care facility (Primary) Today patient counseled on age appropriate routine health concerns for screening and prevention, each reviewed and up to date or declined. Immunizations reviewed and up to date or declined. Labs ordered and reviewed. Risk factors for depression reviewed and negative. Hearing function and visual acuity are intact. ADLs screened and addressed as needed. Functional ability and level of safety reviewed and appropriate. Education, counseling and  referrals performed based on assessed risks today. Patient provided with a copy of personalized plan for preventive services. - Work on weight loss through diet and exercise - Follow up in one year or sooner if needed 2. Essential hypertension - Controlled. No change  - CBC with Differential/Platelet; Future - Comprehensive metabolic panel; Future - Lipid panel; Future - TSH; Future - hydrochlorothiazide (HYDRODIURIL) 25 MG tablet; TAKE 1 TABLET BY MOUTH ONCE DAILY (PATIENT  NEEDS  PHYSICAL)  Dispense: 90 tablet; Refill: 3 - TSH - Lipid panel - Comprehensive metabolic panel - CBC with Differential/Platelet  3. Human immunodeficiency virus (HIV) disease (HCC) - PER ID - CBC with Differential/Platelet; Future - Comprehensive metabolic panel; Future - Lipid panel; Future - TSH; Future - TSH - Lipid panel - Comprehensive metabolic panel - CBC with Differential/Platelet  4. Anxiety state  - CBC with Differential/Platelet; Future - Comprehensive metabolic panel; Future - Lipid panel; Future - TSH; Future - TSH - Lipid panel - Comprehensive metabolic panel - CBC with Differential/Platelet  5. Obesity (BMI 30.0-34.9) - Work on weight loss through diet and exercise - CBC with Differential/Platelet; Future - Comprehensive metabolic panel; Future - Lipid panel; Future - TSH; Future - TSH - Lipid panel - Comprehensive metabolic panel - CBC with Differential/Platelet  6. Mixed hyperlipidemia - Consider statin  - CBC with Differential/Platelet; Future - Comprehensive metabolic panel; Future - Lipid panel; Future - TSH; Future - TSH - Lipid panel - Comprehensive metabolic panel - CBC with Differential/Platelet  Shirline Frees, NP

## 2023-11-08 NOTE — Progress Notes (Signed)
 Patient says that she has pain in the back of the heel and has been told that she has a heel spur. She has had plantar fasciitis in the past but that has not bothered her lately. She has used Voltaren gel and has had a steroid taper in the past; she uses Tylenol as needed. She says that sometimes even tight socks are bothersome to the back of the heel.

## 2023-11-09 ENCOUNTER — Other Ambulatory Visit: Payer: Self-pay

## 2023-11-09 ENCOUNTER — Encounter: Payer: Self-pay | Admitting: Pharmacist

## 2023-11-14 ENCOUNTER — Other Ambulatory Visit: Payer: Self-pay

## 2023-11-17 ENCOUNTER — Other Ambulatory Visit: Payer: Self-pay | Admitting: Adult Health

## 2023-11-17 ENCOUNTER — Other Ambulatory Visit: Payer: Self-pay

## 2023-11-17 DIAGNOSIS — I10 Essential (primary) hypertension: Secondary | ICD-10-CM

## 2023-11-17 NOTE — Progress Notes (Signed)
 Specialty Pharmacy Refill Coordination Note  [name] is a 58 y.o. female contacted today regarding refills of specialty medication(s) Dolutegravir-lamiVUDine (Dovato)   Patient requested [name] at [facility] at [place] date: 11/21/23   Medication will be filled on 03.17.25.

## 2023-11-20 ENCOUNTER — Other Ambulatory Visit: Payer: Self-pay

## 2023-11-21 ENCOUNTER — Ambulatory Visit: Admitting: Sports Medicine

## 2023-11-21 ENCOUNTER — Encounter: Payer: Self-pay | Admitting: Sports Medicine

## 2023-11-21 DIAGNOSIS — M9262 Juvenile osteochondrosis of tarsus, left ankle: Secondary | ICD-10-CM | POA: Diagnosis not present

## 2023-11-21 DIAGNOSIS — M7732 Calcaneal spur, left foot: Secondary | ICD-10-CM | POA: Diagnosis not present

## 2023-11-21 DIAGNOSIS — M7662 Achilles tendinitis, left leg: Secondary | ICD-10-CM | POA: Diagnosis not present

## 2023-11-21 NOTE — Progress Notes (Signed)
 [name] - 58 y.o. female MRN [id number]  Date of birth: 16-Aug-1966  Office Visit Note: Visit Date: 11/21/2023 PCP: Shirline Frees, NP Referred by: Shirline Frees, NP  Subjective: Chief Complaint  Patient presents with   Left Ankle - Pain, Follow-up   HPI: [name] is a pleasant 58 y.o. female who presents today for follow-up of left achilles and posterior heel pain.  Rufus did notice a good improvement after her first shockwave treatment almost 2 weeks ago.  She noticed less pain, also less sensitivity over the posterior heel when putting on socks and shoes.  She does have topical Voltaren gel but was wondering on the frequency in which she should place this.  Overall feels like she is at least 25-30% improved.  Pertinent ROS were reviewed with the patient and found to be negative unless otherwise specified above in HPI.   Assessment & Plan: Visit Diagnoses:  1. Achilles tendinitis, left leg   2. Haglund deformity of left heel   3. Heel spur, left    Plan: Impression is acute on chronic posterior heel pain with distal Achilles insertional pain and a small Haglund deformity.  She did receive a positive response to our first treatment of extracorporeal shockwave therapy, we did repeat this today.  She will rest today but then will resume her stretching and HEP exercises once daily.  Advised on using Voltaren topical twice daily.  We will see her back in about 1 week to repeat shockwave and see what sort of cumulative benefit she has going forward as well as reevaluation.  Discussed supportive shoe wear and open backs as able.  Follow-up: Return in about 1 week (around 11/28/2023) for Mon - WEd of next week for L-heel (reg visit).   Meds & Orders: No orders of the defined types were placed in this encounter.  No orders of the defined types were placed in this encounter.    Procedures: Procedure: ECSWT Indications:  Achilles tendinits, heel spur    Procedure  Details Consent: Risks of procedure as well as the alternatives and risks of each were explained to the patient.  Verbal consent for procedure obtained. Time Out: Verified patient identification, verified procedure, site was marked, verified correct patient position. The area was cleaned with alcohol swab.     The left distal achilles and calcaneus was targeted for Extracorporeal shockwave therapy.    Preset: Achillodynia, Heel spur Power Level: 100 mJ  Frequency: 11 Hz Impulse/cycles: 2200 achilles and calc/spur Head size: Regular   Patient tolerated procedure well without immediate complications.      Clinical History: No specialty comments available.  She reports that she has never smoked. She has never used smokeless tobacco. No results for input(s): "HGBA1C", "LABURIC" in the last 8760 hours.  Objective:    Physical Exam  Gen: Well-appearing, in no acute distress; non-toxic CV: Well-perfused. Warm.  Resp: Breathing unlabored on room air; no wheezing. Psych: Fluid speech in conversation; appropriate affect; normal thought process  Ortho Exam - Left foot/ankle: + Mild TTP calcaneus with small Haglund deformity present.  There is about 5 degrees more of Achilles dorsiflexion up to about 100 degrees.  Otherwise unchanged exam from prior visit.  Imaging: No results found.  Past Medical/Family/Surgical/Social History: Medications & Allergies reviewed per EMR, new medications updated. 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   ANEMIA-NOS 10/27/2006   Human immunodeficiency virus (HIV) disease (HCC) 06/15/2006   Essential hypertension 06/15/2006    Insomnia 06/15/2006   Past Medical History:  Diagnosis Date   Allergy    Anxiety    HIV (human immunodeficiency virus infection) (HCC)    Hypertension    Insomnia    Migraine    Family History  Problem Relation Age of Onset   Cerebral aneurysm Father 1   Aortic aneurysm Father    Diabetes Other    Hypertension Other    Brain cancer Other    Breast cancer Neg Hx    Colon cancer Neg Hx    Esophageal cancer Neg Hx    Stomach cancer Neg Hx    Rectal cancer Neg Hx    Past Surgical History:  Procedure Laterality Date   ABDOMINAL HYSTERECTOMY Right 2004   partial   Social History   Occupational History   Occupation: Environmental health practitioner    Employer: [employer]  Tobacco Use   Smoking status: Never   Smokeless tobacco: Never  Vaping Use   Vaping status: Never Used  Substance and Sexual Activity   Alcohol use: Yes    Alcohol/week: 0.0 standard drinks of alcohol    Comment: socially   Drug use: No   Sexual activity: Yes    Partners: Male    Birth control/protection: Abstinence, Surgical

## 2023-11-21 NOTE — Progress Notes (Signed)
 Patient says that she is overall feeling better. She says that she did have more relief closer to the shockwave. She says that she did have some soreness but overall feels improvement.

## 2023-11-24 ENCOUNTER — Other Ambulatory Visit (HOSPITAL_COMMUNITY): Payer: Self-pay

## 2023-11-28 ENCOUNTER — Encounter: Payer: Self-pay | Admitting: Sports Medicine

## 2023-11-28 ENCOUNTER — Ambulatory Visit: Admitting: Sports Medicine

## 2023-11-28 DIAGNOSIS — M7662 Achilles tendinitis, left leg: Secondary | ICD-10-CM | POA: Diagnosis not present

## 2023-11-28 DIAGNOSIS — M9262 Juvenile osteochondrosis of tarsus, left ankle: Secondary | ICD-10-CM

## 2023-11-28 DIAGNOSIS — M7732 Calcaneal spur, left foot: Secondary | ICD-10-CM | POA: Diagnosis not present

## 2023-11-28 NOTE — Progress Notes (Signed)
 Patient says that she is doing better and feels that the shockwave helps a little more each time. She says that she has been able to wear normal shoes, and that she notices her heel less when walking. She also mentions that her ankle/heel feel looser when she is doing her exercises, and dorsiflexion is not bothersome the way it previously was.

## 2023-11-28 NOTE — Progress Notes (Signed)
 [name] - 58 y.o. female MRN [id number]  Date of birth: [birth year]  Office Visit Note: Visit Date: 11/28/2023 PCP: Shirline Frees, NP Referred by: Shirline Frees, NP  Subjective: Chief Complaint  Patient presents with   Left Ankle - Follow-up   HPI: [name] is a pleasant 58 y.o. female who presents today for follow-up of left achilles and posterior heel pain.   We have performed 2 treatments of extracorporeal shockwave therapy, she is finding benefit from this.  Each treatment has helped a little more each time.  She has been able to wear normal shoes and has less heel pain when walking.  Also noticing some more motion with her exercise.  Using topical Voltaren gel as needed, usually twice daily.  In general, she feels like she is about 55% improved.  Pertinent ROS were reviewed with the patient and found to be negative unless otherwise specified above in HPI.   Assessment & Plan: Visit Diagnoses:  1. Heel spur, left   2. Haglund deformity of left heel   3. Achilles tendinitis, left leg    Plan: Impression is improving posterior heel pain with a small Haglund deformity and concomitant distal Achilles insertional pain.  She has received good relief from 2 treatments of extracorporeal shockwave therapy, feeling over 50% improved.  We did repeat ESWT today.  I did make some modifications to her stretching and home exercises which she will continue 3 sets, once daily.  She will continue topical Voltaren gel twice daily.  I would like to see her back in 1 week for repeat shockwave and further evaluation.  Follow-up: Return in about 1 week (around 12/05/2023) for for left heel (reg visit).   Meds & Orders: No orders of the defined types were placed in this encounter.  No orders of the defined types were placed in this encounter.    Procedures: Procedure: ECSWT Indications:  Achilles tendinits, heel spur    Procedure Details Consent: Risks of procedure as well as the  alternatives and risks of each were explained to the patient.  Verbal consent for procedure obtained. Time Out: Verified patient identification, verified procedure, site was marked, verified correct patient position. The area was cleaned with alcohol swab.     The left distal achilles and calcaneus was targeted for Extracorporeal shockwave therapy.    Preset: Achillodynia, Heel spur Power Level: 110 mJ Frequency: 12 Hz Impulse/cycles: 2200 achilles and calc (then 250 lower setting at sup calc) Head size: Regular   Patient tolerated procedure well without immediate complications      Clinical History: No specialty comments available.  She reports that she has never smoked. She has never used smokeless tobacco. No results for input(s): "HGBA1C", "LABURIC" in the last 8760 hours.  Objective:    Physical Exam  Gen: Well-appearing, in no acute distress; non-toxic CV: Well-perfused. Warm.  Resp: Breathing unlabored on room air; no wheezing. Psych: Fluid speech in conversation; appropriate affect; normal thought process  Ortho Exam - Left foot/ankle: Very mild TTP at the superior calcaneus with small Haglund present although improved from previous visits.  There is fluid range of motion with dorsiflexion and plantarflexion.  Otherwise unchanged exam from prior visit.  Imaging: No results found.  Past Medical/Family/Surgical/Social History: Medications & Allergies reviewed per EMR, new medications updated. 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   ANEMIA-NOS 10/27/2006   Human immunodeficiency virus (HIV) disease (HCC) 06/15/2006   Essential hypertension  06/15/2006   Insomnia 06/15/2006   Past Medical History:  Diagnosis Date   Allergy    Anxiety    HIV (human immunodeficiency virus infection) (HCC)    Hypertension    Insomnia    Migraine    Family History  Problem Relation Age of Onset   Cerebral aneurysm Father 74   Aortic aneurysm Father    Diabetes Other    Hypertension Other    Brain cancer Other    Breast cancer Neg Hx    Colon cancer Neg Hx    Esophageal cancer Neg Hx    Stomach cancer Neg Hx    Rectal cancer Neg Hx    Past Surgical History:  Procedure Laterality Date   ABDOMINAL HYSTERECTOMY Right 2004   partial   Social History   Occupational History   Occupation: Environmental health practitioner    Employer: [employer]  Tobacco Use   Smoking status: Never   Smokeless tobacco: Never  Vaping Use   Vaping status: Never Used  Substance and Sexual Activity   Alcohol use: Yes    Alcohol/week: 0.0 standard drinks of alcohol    Comment: socially   Drug use: No   Sexual activity: Yes    Partners: Male    Birth control/protection: Abstinence, Surgical

## 2023-12-04 ENCOUNTER — Ambulatory Visit: Payer: Self-pay | Admitting: Licensed Clinical Social Worker

## 2023-12-05 ENCOUNTER — Ambulatory Visit: Admitting: Sports Medicine

## 2023-12-05 ENCOUNTER — Encounter: Payer: Self-pay | Admitting: Sports Medicine

## 2023-12-05 DIAGNOSIS — M9262 Juvenile osteochondrosis of tarsus, left ankle: Secondary | ICD-10-CM

## 2023-12-05 DIAGNOSIS — M7662 Achilles tendinitis, left leg: Secondary | ICD-10-CM | POA: Diagnosis not present

## 2023-12-05 DIAGNOSIS — M7732 Calcaneal spur, left foot: Secondary | ICD-10-CM | POA: Diagnosis not present

## 2023-12-05 NOTE — Addendum Note (Signed)
 Addended by: Jamie Kato III on: 12/05/2023 04:22 PM   Modules accepted: Level of Service

## 2023-12-05 NOTE — Progress Notes (Signed)
 Patient says that she is feeling pretty good. She says that she did use Voltaren last night as she had some pain but it did not last long. She has been doing her exercises daily and those are going well.

## 2023-12-05 NOTE — Progress Notes (Signed)
 [name] - 58 y.o. female MRN [id number]  Date of birth: Mar 24, 1966  Office Visit Note: Visit Date: 12/05/2023 PCP: Mercedes Frees, NP Referred by: Mercedes Frees, NP  Subjective: Chief Complaint  Patient presents with   Left Ankle - Follow-up   HPI: [name] is a pleasant 58 y.o. female who presents today for follow-up of left achilles and posterior heel pain.    We have performed 3 treatments of extracorporeal shockwave therapy, she is finding benefit from these. She has used Voltaren gel as needed. Continues with daily exercise/stretching and finding beneficial. At this point, she feels she is at least 65-70%.  At this point, she is not having much pain at all and day-to-day activity, she feels a slight pulling in the mid to distal Achilles.  Pertinent ROS were reviewed with the patient and found to be negative unless otherwise specified above in HPI.   Assessment & Plan: Visit Diagnoses:  1. Achilles tendinitis, left leg   2. Haglund deformity of left heel   3. Heel spur, left    Plan: Impression is improving posterior heel pain with a small Haglund deformity with concomitant distal Achilles insertional pain.  At this point, we have performed 3 treatments of extracorporeal shockwave therapy and she is at least 65-70% better.  We did repeat use ESWT today, patient tolerated well.  She will continue her stretching and home exercises on a daily basis.  She will continue topical Voltaren gel twice daily only as needed.  Given the degree of improvement, I would like to see her back in about 1 week where we will proceed with an additional shockwave treatment and likely at that point give her a holiday for her to continue her cumulative benefit.  If she is essentially all the way improved, she may call and she may cancel her appointment.  Ultimately we will take a holiday from shockwave and have her follow-up with Dr. Magnus [name] who initially evaluated her.  Follow-up: Return in  about 1 week (around 12/12/2023) for L-heel/achilles.   Meds & Orders: No orders of the defined types were placed in this encounter.  No orders of the defined types were placed in this encounter.    Procedures: Procedure: ECSWT Indications:  Achilles tendinits, heel spur    Procedure Details Consent: Risks of procedure as well as the alternatives and risks of each were explained to the patient.  Verbal consent for procedure obtained. Time Out: Verified patient identification, verified procedure, site was marked, verified correct patient position. The area was cleaned with alcohol swab.     The left distal achilles and calcaneus was targeted for Extracorporeal shockwave therapy.    Preset: Achillodynia, Heel spur   Power Level: 110 mJ Frequency: 12 Hz Impulse/cycles: 2400 achilles and calc (then 300 lower setting at sup calc) Head size: Regular   Patient tolerated procedure well without immediate complications       Clinical History: No specialty comments available.  She reports that she has never smoked. She has never used smokeless tobacco. No results for input(s): "HGBA1C", "LABURIC" in the last 8760 hours.  Objective:    Physical Exam  Gen: Well-appearing, in no acute distress; non-toxic CV: Well-perfused. Warm.  Resp: Breathing unlabored on room air; no wheezing. Psych: Fluid speech in conversation; appropriate affect; normal thought process  Ortho Exam - Left foot/ankle: No significant TTP, there is improved swelling near the small Haglund deformity present of the superior calcaneus.  Imaging: No results found.  Past Medical/Family/Surgical/Social History: Medications & Allergies reviewed per EMR, new medications updated. 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   ANEMIA-NOS 10/27/2006   Human immunodeficiency virus (HIV) disease (HCC) 06/15/2006   Essential hypertension 06/15/2006   Insomnia 06/15/2006   Past Medical History:  Diagnosis Date   Allergy    Anxiety    HIV (human immunodeficiency virus infection) (HCC)    Hypertension    Insomnia    Migraine    Family History  Problem Relation Age of Onset   Cerebral aneurysm Father 32   Aortic aneurysm Father    Diabetes Other    Hypertension Other    Brain cancer Other    Breast cancer Neg Hx    Colon cancer Neg Hx    Esophageal cancer Neg Hx    Stomach cancer Neg Hx    Rectal cancer Neg Hx    Past Surgical History:  Procedure Laterality Date   ABDOMINAL HYSTERECTOMY Right 2004   partial   Social History   Occupational History   Occupation: Environmental health practitioner    Employer: [employer]  Tobacco Use   Smoking status: Never   Smokeless tobacco: Never  Vaping Use   Vaping status: Never Used  Substance and Sexual Activity   Alcohol use: Yes    Alcohol/week: 0.0 standard drinks of alcohol    Comment: socially   Drug use: No   Sexual activity: Yes    Partners: Male    Birth control/protection: Abstinence, Surgical

## 2023-12-08 NOTE — Patient Outreach (Signed)
 Care Coordination   Follow Up Visit Note   12/04/2023 Name: [name] MRN: [id number] DOB: 12/01/1965  [name] is a 58 y.o. year old female who sees Nafziger, Kandee Keen, NP for primary care. I spoke with  Algernon Huxley by phone today.  What matters to the patients health and wellness today?  Symptom Management    Goals Addressed             This Visit's Progress    Obtain Supportive Resources-Stress Management   On track    Activities and task to complete in order to accomplish goals.   Keep all upcoming appointments discussed today Continue with compliance of taking medication prescribed by Doctor Implement healthy coping skills discussed to assist with management of symptoms Review counseling resources provided via email         SDOH assessments and interventions completed:  No     Care Coordination Interventions:  Yes, provided  Interventions Today    Flowsheet Row Most Recent Value  Chronic Disease   Chronic disease during today's visit Other  [Anxiety and Depression]  General Interventions   General Interventions Discussed/Reviewed General Interventions Reviewed, Doctor Visits, Labs  Labs --  [Pt recently had labs completed. Agreed to review PCP notes in MyChart]  Doctor Visits Discussed/Reviewed Doctor Visits Reviewed, Specialist  [Pt continues to receive tx with Ortho regarding foot pain]  Mental Health Interventions   Mental Health Discussed/Reviewed Mental Health Reviewed, Coping Strategies, Anxiety, Depression       Follow up plan: Follow up call scheduled for 4-6 weeks    Encounter Outcome:  Patient Visit Completed   Jenel Lucks, LCSW [place]  [facility], [facility] Clinical Social Worker Direct Dial: [phone]  Fax: [phone] Website: [email or web address] 9:12 AM

## 2023-12-08 NOTE — Patient Instructions (Signed)
 Visit Information  Thank you for taking time to visit with me today. Please don't hesitate to contact me if I can be of assistance to you.   Following are the goals we discussed today:   Goals Addressed             This Visit's Progress    Obtain Supportive Resources-Stress Management   On track    Activities and task to complete in order to accomplish goals.   Keep all upcoming appointments discussed today Continue with compliance of taking medication prescribed by Doctor Implement healthy coping skills discussed to assist with management of symptoms Review counseling resources provided via email         Our next appointment is by telephone on 5/12 at 3 PM  Please call the care guide team at [phone] if you need to cancel or reschedule your appointment.   If you are experiencing a Mental Health or Behavioral Health Crisis or need someone to talk to, please call the Suicide and Crisis Lifeline: 988 call 911   Patient verbalizes understanding of instructions and care plan provided today and agrees to view in MyChart. Active MyChart status and patient understanding of how to access instructions and care plan via MyChart confirmed with patient.     [facility]  [facility], [facility] Clinical Social Worker Direct Dial: [phone]  Fax: [phone] Website: [email or web address] 9:13 AM

## 2023-12-12 ENCOUNTER — Encounter: Payer: Self-pay | Admitting: Sports Medicine

## 2023-12-12 ENCOUNTER — Ambulatory Visit: Admitting: Sports Medicine

## 2023-12-12 DIAGNOSIS — M9262 Juvenile osteochondrosis of tarsus, left ankle: Secondary | ICD-10-CM | POA: Diagnosis not present

## 2023-12-12 DIAGNOSIS — M7732 Calcaneal spur, left foot: Secondary | ICD-10-CM

## 2023-12-12 DIAGNOSIS — M7662 Achilles tendinitis, left leg: Secondary | ICD-10-CM | POA: Diagnosis not present

## 2023-12-12 NOTE — Progress Notes (Signed)
 [name] - 58 y.o. female MRN [id number]  Date of birth: 11/02/1965  Office Visit Note: Visit Date: 12/12/2023 PCP: Shirline Frees, NP Referred by: Shirline Frees, NP  Subjective: Chief Complaint  Patient presents with   Left Ankle - Follow-up   HPI: [name] is a pleasant 58 y.o. female who presents today for follow-up of left achilles and posterior heel pain.    We have performed 4 treatments of extracorporeal shockwave therapy, she is overall about 80% improved. Has found benefit from each shockwave treatment. She continues with her HEP/stretching exercises daily. Using topical voltaren gel at nighttime as needed.   Pertinent ROS were reviewed with the patient and found to be negative unless otherwise specified above in HPI.   Assessment & Plan: Visit Diagnoses:  1. Achilles tendinitis, left leg   2. Haglund deformity of left heel   3. Heel spur, left    Plan: Impression is largely improving posterior heel pain with a small Haglund deformity and concomitant distal Achilles insertional pain.  We have performed 4 treatments of extracorporeal shockwave therapy and she is at least 80% better at this time.  We did repeat a treatment today, patient tolerated well.  We discussed the cumulative nature and overall physiology of the treatment.  If she feels after 1 week she is about 95% improved or better, she may hold from future treatments.  We could consider an additional shockwave therapy session if needed.  She will continue her HEP and rehab exercises on a daily basis.  She will continue these for 1 month following her last shockwave treatment and then return to regular activity.  We did discuss possibly considering a short course of 10 to 14 days meloxicam 15 mg to help with any residual inflammation following the cessation of shockwave therapy.  We will reevaluate this at her follow-up or through messaging if needed.  Follow-up: Return for f/u 1-2 weeks if needed.   Meds  & Orders: No orders of the defined types were placed in this encounter.  No orders of the defined types were placed in this encounter.    Procedures: Procedure: ECSWT Indications:  Achilles tendinits, heel spur    Procedure Details Consent: Risks of procedure as well as the alternatives and risks of each were explained to the patient.  Verbal consent for procedure obtained. Time Out: Verified patient identification, verified procedure, site was marked, verified correct patient position. The area was cleaned with alcohol swab.     The left distal achilles and calcaneus was targeted for Extracorporeal shockwave therapy.    Preset: Achillodynia, Heel spur   Power Level: 110 mJ  Frequency: 12 Hz Impulse/cycles: 2400 achilles and calc (then 400 at lower setting at sup calc) Head size: Regular   Patient tolerated procedure well without immediate complications        Clinical History: No specialty comments available.  She reports that she has never smoked. She has never used smokeless tobacco. No results for input(s): "HGBA1C", "LABURIC" in the last 8760 hours.  Objective:    Physical Exam  Gen: Well-appearing, in no acute distress; non-toxic CV: Well-perfused. Warm.  Resp: Breathing unlabored on room air; no wheezing. Psych: Fluid speech in conversation; appropriate affect; normal thought process  Ortho Exam - LLE: There is very trivial TTP at the superior calcaneus, nonpalpable along the Achilles tendon.  There is good range of motion present.  No soft tissue swelling noted.  Imaging: No results found.  Past Medical/Family/Surgical/Social History:  Medications & Allergies reviewed per EMR, new medications updated. 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   ANEMIA-NOS 10/27/2006   Human immunodeficiency virus (HIV) disease (HCC) 06/15/2006   Essential hypertension 06/15/2006   Insomnia 06/15/2006   Past Medical History:  Diagnosis Date   Allergy    Anxiety    HIV (human immunodeficiency virus infection) (HCC)    Hypertension    Insomnia    Migraine    Family History  Problem Relation Age of Onset   Cerebral aneurysm Father 104   Aortic aneurysm Father    Diabetes Other    Hypertension Other    Brain cancer Other    Breast cancer Neg Hx    Colon cancer Neg Hx    Esophageal cancer Neg Hx    Stomach cancer Neg Hx    Rectal cancer Neg Hx    Past Surgical History:  Procedure Laterality Date   ABDOMINAL HYSTERECTOMY Right 2004   partial   Social History   Occupational History   Occupation: Environmental health practitioner    Employer: [employer]  Tobacco Use   Smoking status: Never   Smokeless tobacco: Never  Vaping Use   Vaping status: Never Used  Substance and Sexual Activity   Alcohol use: Yes    Alcohol/week: 0.0 standard drinks of alcohol    Comment: socially   Drug use: No   Sexual activity: Yes    Partners: Male    Birth control/protection: Abstinence, Surgical

## 2023-12-12 NOTE — Progress Notes (Signed)
 Patient says that she is doing well. She is feeling about 10-15% better than last week. She is hopeful with this being the last shockwave treatment and having a break to see how she does.

## 2023-12-15 ENCOUNTER — Other Ambulatory Visit: Payer: Self-pay

## 2023-12-15 NOTE — Progress Notes (Signed)
 Specialty Pharmacy Refill Coordination Note  [name] is a 58 y.o. female contacted today regarding refills of specialty medication(s) Dolutegravir-lamiVUDine (Dovato)   Patient requested [name] at [facility] at [place] date: 12/26/23   Medication will be filled on 12/25/23.

## 2023-12-15 NOTE — Progress Notes (Signed)
 Specialty Pharmacy Ongoing Clinical Assessment Note  [name] is a 58 y.o. female who is being followed by the specialty pharmacy service for RxSp HIV   Patient's specialty medication(s) reviewed today: Dolutegravir-lamiVUDine (Dovato)   Missed doses in the last 4 weeks: 0   Patient/Caregiver did not have any additional questions or concerns.   Therapeutic benefit summary: Patient is achieving benefit   Adverse events/side effects summary: No adverse events/side effects   Patient's therapy is appropriate to: Continue    Goals Addressed             This Visit's Progress    Achieve Undetectable HIV Viral Load < 20   On track    Patient is on track . Patient will maintain adherence         Follow up:  6 months  Otto Herb Specialty Pharmacist

## 2023-12-21 ENCOUNTER — Other Ambulatory Visit: Payer: Self-pay | Admitting: Adult Health

## 2023-12-21 DIAGNOSIS — Z1231 Encounter for screening mammogram for malignant neoplasm of breast: Secondary | ICD-10-CM

## 2023-12-25 ENCOUNTER — Other Ambulatory Visit: Payer: Self-pay

## 2023-12-26 ENCOUNTER — Encounter: Payer: Self-pay | Admitting: Sports Medicine

## 2023-12-26 ENCOUNTER — Ambulatory Visit: Admitting: Sports Medicine

## 2023-12-26 DIAGNOSIS — M7662 Achilles tendinitis, left leg: Secondary | ICD-10-CM

## 2023-12-26 DIAGNOSIS — M7732 Calcaneal spur, left foot: Secondary | ICD-10-CM

## 2023-12-26 DIAGNOSIS — M9262 Juvenile osteochondrosis of tarsus, left ankle: Secondary | ICD-10-CM

## 2023-12-26 NOTE — Progress Notes (Signed)
 Patient says that she is feeling about 90% better overall. She says that based on discussion at last appointment, today may be her last treatment of shockwave therapy as she feels she is near 95% improved.

## 2023-12-26 NOTE — Progress Notes (Signed)
 [name] - 58 y.o. female MRN [id number]  Date of birth: 25-Aug-1966  Office Visit Note: Visit Date: 12/26/2023 PCP: Alto Atta, NP Referred by: Alto Atta, NP  Subjective: Chief Complaint  Patient presents with   Left Ankle - Follow-up   HPI: [name] is a pleasant 58 y.o. female who presents today for follow-up of left Achilles and posterior heel pain.  We have performed 5 total treatments of extracorporeal shockwave therapy, she feels she is at least 90+ percent better at this time.  She does continue with her HEP/stretching exercises twice daily.  She has been using ibuprofen as needed given a dental/root canal procedure.  Pertinent ROS were reviewed with the patient and found to be negative unless otherwise specified above in HPI.   Assessment & Plan: Visit Diagnoses:  1. Achilles tendinitis, left leg   2. Haglund deformity of left heel   3. Heel spur, left    Plan: Impression is nearly resolved posterior heel pain in the setting of a small Haglund deformity with concomitant distal Achilles insertional pain.  We have performed 5 treatments of extracorporeal shockwave therapy and she has received near complete improvement of pain and function.  We did repeat a final treatment today.  I would like her to decrease her stretching/HEP from twice daily to once daily for the next month, following that if she is doing well she will decrease to 3 times weekly and then stop.  After she completes her ibuprofen for her previous dental procedure, she will stop this and topical medications to see how her degree of pain relief has progressed with shockwave therapy.  We will get her back to Dr. Lucienne Ryder who initially saw her for this in about 1 month for reevaluation to see her cumulative benefit going forward.  I am happy to see her back as needed or repeat a few additional shockwave treatments only if required.  Follow-up: Return in about 1 month (around 01/25/2024) for with Dr.  Lucienne Ryder for R-heel/achilles.   Meds & Orders: No orders of the defined types were placed in this encounter.  No orders of the defined types were placed in this encounter.    Procedures: Procedure: ECSWT Indications:  Achilles tendinits, heel spur    Procedure Details Consent: Risks of procedure as well as the alternatives and risks of each were explained to the patient.  Verbal consent for procedure obtained. Time Out: Verified patient identification, verified procedure, site was marked, verified correct patient position. The area was cleaned with alcohol swab.     The left distal achilles and calcaneus was targeted for Extracorporeal shockwave therapy.    Preset: Achillodynia, Heel spur   Power Level: 110 mJ Frequency: 12 Hz Impulse/cycles: 2500 achilles and calc (then 500 at lower setting at sup calc) Head size: Regular   Patient tolerated procedure well without immediate complications      Clinical History: No specialty comments available.  She reports that she has never smoked. She has never used smokeless tobacco. No results for input(s): "HGBA1C", "LABURIC" in the last 8760 hours.  Objective:    Physical Exam  Gen: Well-appearing, in no acute distress; non-toxic CV: Well-perfused. Warm.  Resp: Breathing unlabored on room air; no wheezing. Psych: Fluid speech in conversation; appropriate affect; normal thought process  Ortho Exam - Left foot/ankle: No significant TTP of the Achilles or the superior calcaneus.  No soft tissue swelling noted.  Good ankle dorsiflexion/plantarflexion.  Imaging: No results found.  Past Medical/Family/Surgical/Social  History: Medications & Allergies reviewed per EMR, new medications updated. 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   ANEMIA-NOS 10/27/2006   Human immunodeficiency virus (HIV) disease (HCC) 06/15/2006   Essential hypertension 06/15/2006   Insomnia 06/15/2006   Past Medical History:  Diagnosis Date   Allergy    Anxiety    HIV (human immunodeficiency virus infection) (HCC)    Hypertension    Insomnia    Migraine    Family History  Problem Relation Age of Onset   Cerebral aneurysm Father 9   Aortic aneurysm Father    Diabetes Other    Hypertension Other    Brain cancer Other    Breast cancer Neg Hx    Colon cancer Neg Hx    Esophageal cancer Neg Hx    Stomach cancer Neg Hx    Rectal cancer Neg Hx    Past Surgical History:  Procedure Laterality Date   ABDOMINAL HYSTERECTOMY Right 2004   partial   Social History   Occupational History   Occupation: Environmental health practitioner    Employer: [employer]  Tobacco Use   Smoking status: Never   Smokeless tobacco: Never  Vaping Use   Vaping status: Never Used  Substance and Sexual Activity   Alcohol use: Yes    Alcohol/week: 0.0 standard drinks of alcohol    Comment: socially   Drug use: No   Sexual activity: Yes    Partners: Male    Birth control/protection: Abstinence, Surgical

## 2023-12-29 ENCOUNTER — Ambulatory Visit: Payer: Self-pay

## 2023-12-29 NOTE — Telephone Encounter (Signed)
 Copied from CRM [id number]. Topic: Clinical - Red Word Triage >> Dec 29, 2023 10:56 AM [name] wrote: Red Word that prompted transfer to Nurse Triage: Blood pressure- 2 dental procedures done last week crown placement and root canal. 100/137. Has been fluctuating since then. Today its reading 141/103, she is anxious that it keeps going up.   Chief Complaint: Elevated blood pressures  Symptoms: No associated symptoms  Frequency: intermittent   Pertinent Negatives: Patient denies any dizziness, chest pain, or other symptom at this time  Disposition: [] ED /[] Urgent Care (no appt availability in office) / [x] Appointment(In office/virtual)/ []  [facility]/ [] Home Care/ [] Refused Recommended Disposition /[] [facility]/ []  Follow-up with PCP Additional Notes: Patient states that last week she had 2 dental procedures done and has had some elevated blood pressures since that time. Prior to calling she took her blood pressure and it was 143/107. She denies any symptoms with her elevated blood pressures. Appointment made for the patient next week for evaluation. Patient instructed to call back for new or worsening symptoms. Patient verbalized understanding and agreement with this plan.     Reason for Disposition  [1] Systolic BP  >= 130 OR Diastolic >= 80 AND [2] taking BP medications  Answer Assessment - Initial Assessment Questions 1. BLOOD PRESSURE: "What is the blood pressure?" "Did you take at least two measurements 5 minutes apart?"     143/107 2. ONSET: "When did you take your blood pressure?"     Took just before calling   3. HOW: "How did you take your blood pressure?" (e.g., automatic home BP monitor, visiting nurse)     Automatic BP cuff 4. HISTORY: "Do you have a history of high blood pressure?"     Yes 5. MEDICINES: "Are you taking any medicines for blood pressure?" "Have you missed any doses recently?"     Yes, has not missed any  6. OTHER SYMPTOMS: "Do you have  any symptoms?" (e.g., blurred vision, chest pain, difficulty breathing, headache, weakness)     No  Protocols used: Blood Pressure - High-A-AH

## 2024-01-03 ENCOUNTER — Ambulatory Visit: Admitting: Adult Health

## 2024-01-03 ENCOUNTER — Encounter: Payer: Self-pay | Admitting: Adult Health

## 2024-01-03 VITALS — BP 140/100 | HR 91 | Temp 98.4°F | Ht 67.0 in | Wt 225.0 lb

## 2024-01-03 DIAGNOSIS — I10 Essential (primary) hypertension: Secondary | ICD-10-CM

## 2024-01-03 MED ORDER — AMLODIPINE BESYLATE 2.5 MG PO TABS
2.5000 mg | ORAL_TABLET | Freq: Every day | ORAL | 0 refills | Status: DC
Start: 2024-01-03 — End: 2024-01-26

## 2024-01-03 NOTE — Progress Notes (Signed)
 Subjective:    Patient ID: [name], female    DOB: 01-Oct-1965, 58 y.o.   MRN: [id number]  HPI 58  year old female who  has a past medical history of Allergy, Anxiety, HIV (human immunodeficiency virus infection) (HCC), Hypertension, Insomnia, and Migraine.  She presents to the office today for concern of hypertension. She reports that she recently had a dental procedure done and since that time she has noticed some home elevated blood pressures in the high 130's - 150/90-100's since after the procedure. . She is currently managed with hydrochlorothiazide  25 mg daily.   BP Readings from Last 3 Encounters:  01/03/24 (!) 140/100  11/08/23 118/74  08/15/23 (!) 142/103   Wt Readings from Last 3 Encounters:  01/03/24 225 lb (102.1 kg)  11/08/23 229 lb (103.9 kg)  08/15/23 230 lb 3.2 oz (104.4 kg)   Review of Systems See HPI   Past Medical History:  Diagnosis Date   Allergy    Anxiety    HIV (human immunodeficiency virus infection) (HCC)    Hypertension    Insomnia    Migraine     Social History   Socioeconomic History   Marital status: Single    Spouse name: Not on file   Number of children: 1   Years of education: Not on file   Highest education level: Not on file  Occupational History   Occupation: Environmental health practitioner    Employer: [employer]  Tobacco Use   Smoking status: Never   Smokeless tobacco: Never  Vaping Use   Vaping status: Never Used  Substance and Sexual Activity   Alcohol use: Yes    Alcohol/week: 0.0 standard drinks of alcohol    Comment: socially   Drug use: No   Sexual activity: Yes    Partners: Male    Birth control/protection: Abstinence, Surgical  Other Topics Concern   Not on file  Social History Narrative   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    Social Drivers of Corporate investment banker Strain: Not on file   Food Insecurity: No Food Insecurity (09/18/2023)   Hunger Vital Sign    Worried About Running Out of Food in the Last Year: Never true    Ran Out of Food in the Last Year: Never true  Transportation Needs: No Transportation Needs (09/18/2023)   PRAPARE - Administrator, Civil Service (Medical): No    Lack of Transportation (Non-Medical): No  Physical Activity: Not on file  Stress: Not on file  Social Connections: Not on file  Intimate Partner Violence: Not At Risk (09/18/2023)   Humiliation, Afraid, Rape, and Kick questionnaire    Fear of Current or Ex-Partner: No    Emotionally Abused: No    Physically Abused: No    Sexually Abused: No    Past Surgical History:  Procedure Laterality Date   ABDOMINAL HYSTERECTOMY Right 2004   partial    Family History  Problem Relation Age of Onset   Cerebral aneurysm Father 45   Aortic aneurysm Father    Diabetes Other    Hypertension Other    Brain cancer Other    Breast cancer Neg Hx    Colon cancer Neg Hx    Esophageal cancer Neg Hx    Stomach cancer Neg Hx  Rectal cancer Neg Hx     Allergies  Allergen Reactions   Iodinated Contrast Media Other (See Comments)    Pain in my eyes .    Sulfonamide Derivatives Hives    Current Outpatient Medications on File Prior to Visit  Medication Sig Dispense Refill   cholecalciferol (VITAMIN D3) 25 MCG (1000 UNIT) tablet Take 1,000 Units by mouth daily.     clonazePAM  (KLONOPIN ) 0.5 MG tablet Take 1 tablet (0.5 mg total) by mouth daily. 30 tablet 2   cyanocobalamin 100 MCG tablet Take 1 tablet by mouth daily.     dolutegravir -lamiVUDine  (DOVATO ) 50-300 MG tablet Take 1 tablet by mouth daily. 30 tablet 11   hydrochlorothiazide  (HYDRODIURIL ) 25 MG tablet TAKE 1 TABLET BY MOUTH ONCE DAILY (PATIENT NEEDS PHYSICAL) 30 tablet 26   hydrochlorothiazide  (HYDRODIURIL ) 25 MG tablet TAKE 1 TABLET BY MOUTH ONCE DAILY (PATIENT NEEDS PHYSICAL) 30 tablet 11   Probiotic Product (PROBIOTIC BLEND  PO) Take 1 tablet by mouth.     No current facility-administered medications on file prior to visit.    BP (!) 140/100   Pulse 91   Temp 98.4 F (36.9 C) (Oral)   Ht 5\' 7"  (1.702 m)   Wt 225 lb (102.1 kg)   SpO2 96%   BMI 35.24 kg/m      Objective:   Physical Exam Vitals and nursing note reviewed.  Constitutional:      Appearance: Normal appearance.  Cardiovascular:     Rate and Rhythm: Normal rate and regular rhythm.     Pulses: Normal pulses.     Heart sounds: Normal heart sounds.  Pulmonary:     Effort: Pulmonary effort is normal.     Breath sounds: Normal breath sounds.  Musculoskeletal:        General: Normal range of motion.  Skin:    General: Skin is warm and dry.  Neurological:     General: No focal deficit present.     Mental Status: She is alert and oriented to person, place, and time.  Psychiatric:        Mood and Affect: Mood normal.        Behavior: Behavior normal.        Thought Content: Thought content normal.        Judgment: Judgment normal.       Assessment & Plan:  1. Essential hypertension (Primary) - Will add Norvasc 2.5 mg daily to her regimen  - Follow up in 3 weeks  - amLODipine (NORVASC) 2.5 MG tablet; Take 1 tablet (2.5 mg total) by mouth daily.  Dispense: 30 tablet; Refill: 0   Alto Atta, NP

## 2024-01-15 ENCOUNTER — Encounter: Payer: Self-pay | Admitting: Licensed Clinical Social Worker

## 2024-01-16 ENCOUNTER — Other Ambulatory Visit: Payer: Self-pay

## 2024-01-18 ENCOUNTER — Other Ambulatory Visit: Payer: Self-pay

## 2024-01-22 ENCOUNTER — Other Ambulatory Visit: Payer: Self-pay

## 2024-01-22 ENCOUNTER — Other Ambulatory Visit (HOSPITAL_COMMUNITY): Payer: Self-pay

## 2024-01-22 NOTE — Progress Notes (Signed)
 Specialty Pharmacy Refill Coordination Note  [name] is a 58 y.o. female contacted today regarding refills of specialty medication(s) Dolutegravir -lamiVUDine  (Dovato )   Patient requested [name] at [facility] at [place] date: 01/31/24   Medication will be filled on 01/30/24.

## 2024-01-25 ENCOUNTER — Other Ambulatory Visit: Payer: Self-pay

## 2024-01-25 NOTE — Progress Notes (Signed)
 Clinical Intervention Note  Clinical Intervention Notes: Patient reports initiating amlodipine . No DDIs identified with Dovato .   Clinical Intervention Outcomes: Prevention of an adverse drug event   [name] Specialty Pharmacist

## 2024-01-26 ENCOUNTER — Other Ambulatory Visit: Payer: Self-pay | Admitting: Adult Health

## 2024-01-26 DIAGNOSIS — F411 Generalized anxiety disorder: Secondary | ICD-10-CM

## 2024-01-26 DIAGNOSIS — I10 Essential (primary) hypertension: Secondary | ICD-10-CM

## 2024-01-26 NOTE — Telephone Encounter (Signed)
 Okay for refill?

## 2024-01-30 ENCOUNTER — Other Ambulatory Visit: Payer: Self-pay

## 2024-01-31 ENCOUNTER — Ambulatory Visit: Admitting: Adult Health

## 2024-01-31 ENCOUNTER — Ambulatory Visit (INDEPENDENT_AMBULATORY_CARE_PROVIDER_SITE_OTHER)

## 2024-01-31 ENCOUNTER — Encounter: Payer: Self-pay | Admitting: Adult Health

## 2024-01-31 VITALS — BP 128/88 | HR 68 | Temp 98.3°F | Ht 67.0 in | Wt 224.0 lb

## 2024-01-31 DIAGNOSIS — M79644 Pain in right finger(s): Secondary | ICD-10-CM

## 2024-01-31 DIAGNOSIS — M79641 Pain in right hand: Secondary | ICD-10-CM | POA: Diagnosis not present

## 2024-01-31 DIAGNOSIS — I10 Essential (primary) hypertension: Secondary | ICD-10-CM | POA: Diagnosis not present

## 2024-01-31 MED ORDER — AMLODIPINE BESYLATE 2.5 MG PO TABS
2.5000 mg | ORAL_TABLET | Freq: Every day | ORAL | 3 refills | Status: DC
Start: 2024-01-31 — End: 2024-05-07

## 2024-01-31 NOTE — Progress Notes (Signed)
 Subjective:    Patient ID: [name], female    DOB: Sep 16, 1965, 58 y.o.   MRN: [id number]  HPI 58 year old female who  has a past medical history of Allergy, Anxiety, HIV (human immunodeficiency virus infection) (HCC), Hypertension, Insomnia, and Migraine.  She presents to the office today for follow-up on hypertension.  Seen about 3 weeks ago she reported that she had recently had a dental procedure and had elevated blood pressures in the 130s to 150s over 90s to 100s since the procedure.  She was taking hydrochlorothiazide  25 mg daily.  At this time we added Norvasc  2.5 mg to her regimen   Today she reports that she is tolerating the medication well. She has been checking her BP at home with readings in the 120's/70-80's. She has not had any dizziness or lightheaded.   [place] she reports that about two weeks ago she tripped while wearing heels falling forward she put her right hand out to stop her fall. Since that time she has had pain, swelling and loss of ROM to her right thumb. Does reports that the swelling and pain has improved but still cannot bend her thumb completely.   Review of Systems See HPI   Past Medical History:  Diagnosis Date   Allergy    Anxiety    HIV (human immunodeficiency virus infection) (HCC)    Hypertension    Insomnia    Migraine     Social History   Socioeconomic History   Marital status: Single    Spouse name: Not on file   Number of children: 1   Years of education: Not on file   Highest education level: Not on file  Occupational History   Occupation: Environmental health practitioner    Employer: [employer]  Tobacco Use   Smoking status: Never   Smokeless tobacco: Never  Vaping Use   Vaping status: Never Used  Substance and Sexual Activity   Alcohol use: Yes    Alcohol/week: 0.0 standard drinks of alcohol    Comment: socially   Drug use: No   Sexual activity: Yes    Partners: Male    Birth control/protection: Abstinence, Surgical   Other Topics Concern   Not on file  Social History Narrative   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    Social Drivers of Corporate investment banker Strain: Not on file  Food Insecurity: No Food Insecurity (09/18/2023)   Hunger Vital Sign    Worried About Running Out of Food in the Last Year: Never true    Ran Out of Food in the Last Year: Never true  Transportation Needs: No Transportation Needs (09/18/2023)   PRAPARE - Administrator, Civil Service (Medical): No    Lack of Transportation (Non-Medical): No  Physical Activity: Not on file  Stress: Not on file  Social Connections: Not on file  Intimate Partner Violence: Not At Risk (09/18/2023)   Humiliation, Afraid, Rape, and Kick questionnaire    Fear of Current or Ex-Partner: No    Emotionally Abused: No    Physically Abused: No    Sexually Abused: No    Past Surgical History:  Procedure Laterality Date   ABDOMINAL HYSTERECTOMY Right 2004   partial    Family History  Problem Relation Age of Onset   Cerebral  aneurysm Father 37   Aortic aneurysm Father    Diabetes Other    Hypertension Other    Brain cancer Other    Breast cancer Neg Hx    Colon cancer Neg Hx    Esophageal cancer Neg Hx    Stomach cancer Neg Hx    Rectal cancer Neg Hx     Allergies  Allergen Reactions   Iodinated Contrast Media Other (See Comments)    Pain in my eyes .    Sulfonamide Derivatives Hives    Current Outpatient Medications on File Prior to Visit  Medication Sig Dispense Refill   cholecalciferol (VITAMIN D3) 25 MCG (1000 UNIT) tablet Take 1,000 Units by mouth daily.     clonazePAM  (KLONOPIN ) 0.5 MG tablet TAKE 1 TABLET BY MOUTH EVERY DAY 30 tablet 2   cyanocobalamin 100 MCG tablet Take 1 tablet by mouth daily.     dolutegravir -lamiVUDine  (DOVATO ) 50-300 MG tablet Take 1 tablet by mouth daily. 30 tablet 11    hydrochlorothiazide  (HYDRODIURIL ) 25 MG tablet TAKE 1 TABLET BY MOUTH ONCE DAILY (PATIENT NEEDS PHYSICAL) 30 tablet 11   Probiotic Product (PROBIOTIC BLEND PO) Take 1 tablet by mouth.     No current facility-administered medications on file prior to visit.    BP 128/88   Pulse 68   Temp 98.3 F (36.8 C) (Oral)   Ht 5\' 7"  (1.702 m)   Wt 224 lb (101.6 kg)   SpO2 98%   BMI 35.08 kg/m       Objective:   Physical Exam Vitals and nursing note reviewed.  Constitutional:      Appearance: Normal appearance.  Cardiovascular:     Rate and Rhythm: Normal rate and regular rhythm.     Pulses: Normal pulses.     Heart sounds: Normal heart sounds.  Pulmonary:     Effort: Pulmonary effort is normal.     Breath sounds: Normal breath sounds.  Musculoskeletal:        General: Swelling present.     Right wrist: No tenderness, bony tenderness or snuff box tenderness. Normal range of motion.     Right hand: Swelling, tenderness and bony tenderness present. Decreased range of motion.  Skin:    General: Skin is warm and dry.     Capillary Refill: Capillary refill takes less than 2 seconds.  Neurological:     General: No focal deficit present.     Mental Status: She is alert and oriented to person, place, and time.  Psychiatric:        Mood and Affect: Mood normal.        Behavior: Behavior normal.        Thought Content: Thought content normal.        Judgment: Judgment normal.        Assessment & Plan:  1. Essential hypertension (Primary) - BP at goal. Continue with Norvasc  and hydrochlorothiazide  25 mg daily.  - amLODipine  (NORVASC ) 2.5 MG tablet; Take 1 tablet (2.5 mg total) by mouth daily.  Dispense: 90 tablet; Refill: 3  2. Pain of right thumb - Will get xray since her symptoms have been present for two weeks.  - Doubt fracture and likely strain  - DG Hand Complete Right; Future  Alto Atta, NP

## 2024-02-02 ENCOUNTER — Ambulatory Visit: Payer: Self-pay | Admitting: Adult Health

## 2024-02-05 ENCOUNTER — Telehealth: Payer: Self-pay | Admitting: *Deleted

## 2024-02-05 NOTE — Telephone Encounter (Signed)
 Copied from CRM [id number]. Topic: Clinical - Medical Advice >> Feb 02, 2024 11:01 AM Jenice Mitts wrote: Reason for CRM: Patient is calling because she would like to know is there something else she should be doing or is just wearing her thumb splint will be [employer]

## 2024-02-06 NOTE — Telephone Encounter (Signed)
 Called pt and advised again of Cory's note "We can refer to ortho if she would like. I am not sure if there is anything that can be done and likely this will heal on its own." Pt stated she will just continue to wear the splint.

## 2024-02-14 ENCOUNTER — Telehealth: Payer: Self-pay

## 2024-02-14 NOTE — Progress Notes (Signed)
 Complex Care Management Care Guide Note  02/14/2024 Name: [name] MRN: [id number] DOB: 03-15-1966  [name] is a 58 y.o. year old female who is a primary care patient of Nafziger, Randel Buss, NP and is actively engaged with the care management team. I reached out to Corine Dice by phone today to assist with re-scheduling  with the Licensed Clinical Child psychotherapist.  Follow up plan: Unsuccessful telephone outreach attempt made. A HIPAA compliant phone message was left for the patient providing contact information and requesting a return call.  Creola Doheny [facility], [facility] Guide  Direct Dial: [phone]  Fax [phone]

## 2024-02-15 NOTE — Progress Notes (Signed)
 Complex Care Management Care Guide Note  02/15/2024 Name: [name] MRN: [id number] DOB: 1966-01-15  [name] is a 58 y.o. year old female who is a primary care patient of Nafziger, Randel Buss, NP and is actively engaged with the care management team. I reached out to Corine Dice by phone today to assist with re-scheduling  with the Licensed Clinical Child psychotherapist.  Follow up plan: Unsuccessful telephone outreach attempt made. A HIPAA compliant phone message was left for the patient providing contact information and requesting a return call.  Creola Doheny [facility], [facility] Guide  Direct Dial: [phone]  Fax [phone]

## 2024-02-16 ENCOUNTER — Telehealth: Payer: Self-pay

## 2024-02-16 NOTE — Progress Notes (Signed)
 Complex Care Management Care Guide Note  02/16/2024 Name: [name] MRN: [id number] DOB: June 15, 1966  [name] is a 58 y.o. year old female who is a primary care patient of Nafziger, Randel Buss, NP and is actively engaged with the care management team. I reached out to Corine Dice by phone today to assist with re-scheduling  with the Licensed Clinical Child psychotherapist.  Follow up plan: Unsuccessful telephone outreach attempt made. A HIPAA compliant phone message was left for the patient providing contact information and requesting a return call.  Creola Doheny [facility], [facility] Guide  Direct Dial: [phone]  Fax [phone]

## 2024-02-16 NOTE — Progress Notes (Signed)
 Complex Care Management Care Guide Note  02/16/2024 Name: [name] MRN: [id number] DOB: 1965/09/09  [name] is a 58 y.o. year old female who is a primary care patient of Nafziger, Randel Buss, NP and is actively engaged with the care management team. I reached out to Corine Dice by phone today to assist with re-scheduling  with the Licensed Clinical Child psychotherapist.  Follow up plan: Telephone appointment with complex care management team member scheduled for:  03-01-24  Creola Doheny [facility], [facility] Guide  Direct Dial: [phone]  Fax [phone]

## 2024-02-21 ENCOUNTER — Other Ambulatory Visit: Payer: Self-pay

## 2024-02-21 ENCOUNTER — Encounter (INDEPENDENT_AMBULATORY_CARE_PROVIDER_SITE_OTHER): Payer: Self-pay

## 2024-02-22 ENCOUNTER — Other Ambulatory Visit: Payer: Self-pay

## 2024-02-22 ENCOUNTER — Other Ambulatory Visit (HOSPITAL_COMMUNITY): Payer: Self-pay

## 2024-02-22 NOTE — Progress Notes (Signed)
 Specialty Pharmacy Refill Coordination Note  MyChart Questionnaire Submission  [name] is a 58 y.o. female contacted today regarding refills of specialty medication(s) Dovato .  Patient requested: (Patient-Rptd) Pickup at [facility] date: (Patient-Rptd) 02/27/24  Medication will be filled on 02/26/24.

## 2024-02-26 ENCOUNTER — Other Ambulatory Visit: Payer: Self-pay

## 2024-02-27 ENCOUNTER — Telehealth: Payer: Self-pay

## 2024-02-27 NOTE — Telephone Encounter (Signed)
 Copied from CRM [id number]. Topic: Clinical - Request for Lab/Test Order >> Feb 26, 2024  8:48 AM Burnard DEL wrote: Reason for CRM: Patient would like to know if [name] thinks she should come in for a follow up xray for her thumb fracture?She stated that  it is still not bending and wanted to get a follow up xray to see if it is healing properly?

## 2024-02-27 NOTE — Telephone Encounter (Signed)
 Noted

## 2024-03-01 ENCOUNTER — Other Ambulatory Visit: Payer: Self-pay | Admitting: Licensed Clinical Social Worker

## 2024-03-01 NOTE — Patient Outreach (Signed)
 Complex Care Management   Visit Note  03/01/2024  Name:  [name] MRN: [id number] DOB: [birth year]  Situation: Referral received for Complex Care Management related to Mental/Behavioral Health diagnosis Anxiety I obtained verbal consent from Patient.  Visit completed with pt  on the phone  Background:   Past Medical History:  Diagnosis Date   Allergy    Anxiety    HIV (human immunodeficiency virus infection) (HCC)    Hypertension    Insomnia    Migraine     Assessment: Patient Reported Symptoms:  Cognitive Cognitive Status: Alert and oriented to person, place, and time, Normal speech and language skills Cognitive/Intellectual Conditions Management [RPT]: None reported or documented in medical history or problem list      Neurological Neurological Review of Symptoms: No symptoms reported    HEENT HEENT Symptoms Reported: No symptoms reported      Cardiovascular Cardiovascular Symptoms Reported: No symptoms reported Does patient have uncontrolled Hypertension?: No Cardiovascular Conditions: Hypertension  Respiratory Respiratory Symptoms Reported: No symptoms reported    Endocrine Patient reports the following symptoms related to hypoglycemia or hyperglycemia : No symptoms reported    Gastrointestinal Gastrointestinal Symptoms Reported: No symptoms reported      Genitourinary Genitourinary Symptoms Reported: No symptoms reported    Integumentary Integumentary Symptoms Reported: No symptoms reported    Musculoskeletal Musculoskelatal Symptoms Reviewed: Muscle pain Additional Musculoskeletal Details: Pt reported a thumb fracture, f/up with providers Musculoskeletal Conditions: Fracture Falls in the past year?: No Number of falls in past year: 1 or less Was there an injury with Fall?: No Fall Risk Category Calculator: 0 Patient Fall Risk Level: Low Fall Risk    Psychosocial Psychosocial Symptoms Reported: Other Other Psychosocial Conditions: Stress Additional  Psychological Details: Stress Management strategies discussed Behavioral Health Conditions: Anxiety Behavioral Management Strategies: Adequate rest, Support system, Coping strategies Major Change/Loss/Stressor/Fears (CP): Medical condition, self Quality of Family Relationships: involved Do you feel physically threatened by others?: No      08/15/2023    4:27 PM  Depression screen PHQ 2/9  Decreased Interest 2  Down, Depressed, Hopeless 2  PHQ - 2 Score 4  Altered sleeping 3  Tired, decreased energy 2  Change in appetite 2  Feeling bad or failure about yourself  2  Trouble concentrating 1  Moving slowly or fidgety/restless 1  Suicidal thoughts 0  PHQ-9 Score 15  Difficult doing work/chores Somewhat difficult    There were no vitals filed for this visit.  Medications Reviewed Today     Reviewed by Ezzard Rolin BIRCH, LCSW (Social Worker) on 03/01/24 at 1307  Med List Status: <None>   Medication Order Taking? Sig Documenting Provider Last Dose Status Informant  amLODipine  (NORVASC ) 2.5 MG tablet [id number] Yes Take 1 tablet (2.5 mg total) by mouth daily. Nafziger, Darleene, NP  Active   cholecalciferol (VITAMIN D3) 25 MCG (1000 UNIT) tablet [id number] Yes Take 1,000 Units by mouth daily. [provider]  Active   clonazePAM  (KLONOPIN ) 0.5 MG tablet [id number] Yes TAKE 1 TABLET BY MOUTH EVERY DAY Nafziger, Cory, NP  Active   cyanocobalamin 100 MCG tablet [id number] Yes Take 1 tablet by mouth daily. [provider]  Active   dolutegravir -lamiVUDine  (DOVATO ) 50-300 MG tablet [id number] Yes Take 1 tablet by mouth daily. Jerrell Cleatus Ned, MD  Active   hydrochlorothiazide  (HYDRODIURIL ) 25 MG tablet [id number] Yes TAKE 1 TABLET BY MOUTH ONCE DAILY (PATIENT NEEDS PHYSICAL) Merna Darleene, NP  Active   Probiotic Product (PROBIOTIC BLEND PO) [id number]  Yes Take 1 tablet by mouth. [provider]  Active             Recommendation:   Continue Current Plan of  Care  Follow Up Plan:   Telephone follow-up in 1 month  Rolin Kerns, LCSW [facility]  [facility], [facility] Clinical Social Worker Direct Dial: [phone]  Fax: [phone] Website: [email or web address] 1:26 PM

## 2024-03-01 NOTE — Patient Instructions (Signed)
 Visit Information  Thank you for taking time to visit with me today. Please don't hesitate to contact me if I can be of assistance to you before our next scheduled appointment.  Our next appointment is by telephone on 07/25 at 1 PM Please call the care guide team at [phone] if you need to cancel or reschedule your appointment.   Following is a copy of your care plan:   Goals Addressed             This Visit's Progress    LCSW [facility] Social Work Care Plan   On track    Problems:   Disease Management support and education needs related to Depression: anxiety  CSW Clinical Goal(s):   Over the next 90 days the Patient will attend all scheduled medical appointments as evidenced by patient report and care team review of appointment completion in electronic MEDICAL RECORD NUMBER  demonstrate a reduction in symptoms related to Depression: anxiety .  Interventions:  Mental Health:  Evaluation of current treatment plan related to Depression: anxiety Active listening / Reflection utilized Emotional Support Provided Mindfulness or Relaxation training provided Provided general psycho-education for mental health needs Solution-Focued Strategies employed:  Patient Goals/Self-Care Activities:  Continue taking your medication as prescribed.   Increase coping skills, healthy habits, and stress reduction  Plan:   Telephone follow up appointment with care management team member scheduled for:  2-4 weeks     COMPLETED: Obtain Supportive Resources-Stress Management       Activities and task to complete in order to accomplish goals.   Keep all upcoming appointments discussed today Continue with compliance of taking medication prescribed by Doctor Implement healthy coping skills discussed to assist with management of symptoms Review counseling resources provided via email         Please call the Suicide and Crisis Lifeline: 988 go to [facility] [address], [place] [phone]) call 911 if you are experiencing a Mental Health or Behavioral Health Crisis or need someone to talk to.  Patient verbalizes understanding of instructions and care plan provided today and agrees to view in MyChart. Active MyChart status and patient understanding of how to access instructions and care plan via MyChart confirmed with patient.     Rolin Ezzard HUGHS [facility]  [facility] Clinical Social Worker Direct Dial: [phone]  Fax: [phone] Website: [email or web address] 1:27 PM

## 2024-03-19 ENCOUNTER — Ambulatory Visit

## 2024-03-28 ENCOUNTER — Other Ambulatory Visit (HOSPITAL_COMMUNITY): Payer: Self-pay

## 2024-03-29 ENCOUNTER — Other Ambulatory Visit: Payer: Self-pay | Admitting: Licensed Clinical Social Worker

## 2024-04-01 ENCOUNTER — Other Ambulatory Visit: Payer: Self-pay

## 2024-04-01 ENCOUNTER — Other Ambulatory Visit: Payer: Self-pay | Admitting: Infectious Diseases

## 2024-04-01 ENCOUNTER — Other Ambulatory Visit: Payer: Self-pay | Admitting: Student in an Organized Health Care Education/Training Program

## 2024-04-01 DIAGNOSIS — B20 Human immunodeficiency virus [HIV] disease: Secondary | ICD-10-CM

## 2024-04-01 MED ORDER — DOVATO 50-300 MG PO TABS
1.0000 | ORAL_TABLET | Freq: Every day | ORAL | 11 refills | Status: DC
  Filled 2024-04-01: qty 30, 30d supply, fill #0
  Filled 2024-04-25 – 2024-04-30 (×2): qty 30, 30d supply, fill #1
  Filled 2024-06-03 – 2024-06-06 (×2): qty 30, 30d supply, fill #2
  Filled 2024-07-03: qty 30, 30d supply, fill #3
  Filled 2024-08-05 – 2024-08-08 (×5): qty 30, 30d supply, fill #4

## 2024-04-01 NOTE — Progress Notes (Signed)
 RR was sent to Cleatus Debby Specking, MD and refill was denied patient is unknown to prescriber.  Sent RR to OG Juliane Fenton MD. Awaiting for refill approval.

## 2024-04-01 NOTE — Progress Notes (Signed)
 Specialty Pharmacy Refill Coordination Note  [name] is a 58 y.o. female contacted today regarding refills of specialty medication(s) Dolutegravir -lamiVUDine  (Dovato )   Patient requested [facility] at [facility] [place] date: 04/02/24   Medication will be filled on 04/01/24.   This fill date is pending response to refill request from provider. Patient is aware and if they have not received fill by intended date they must follow up with pharmacy.   Patient may call back and switch to delivery.

## 2024-04-01 NOTE — Patient Instructions (Signed)
 Visit Information  Thank you for taking time to visit with me today. Please don't hesitate to contact me if I can be of assistance to you before our next scheduled appointment.  Your next care management appointment is by telephone on 08/29 at 1 PM  Please call the care guide team at [phone] if you need to cancel, schedule, or reschedule an appointment.   Please call the Suicide and Crisis Lifeline: 988 go to [facility] [address], [place] [phone]) call 911 if you are experiencing a Mental Health or Behavioral Health Crisis or need someone to talk to.  Rolin Kerns, LCSW [place]  [facility], [facility] Clinical Social Worker Direct Dial: [phone]  Fax: [phone] Website: [email or web address] 3:49 PM

## 2024-04-01 NOTE — Patient Outreach (Signed)
 Complex Care Management   Visit Note  03/29/2024  Name:  [name] MRN: [id number] DOB: Jun 21, 1966  Situation: Referral received for Complex Care Management related to Mental/Behavioral Health diagnosis Anxiety I obtained verbal consent from Patient.  Visit completed with pt  on the phone  Background:   Past Medical History:  Diagnosis Date   Allergy    Anxiety    HIV (human immunodeficiency virus infection) (HCC)    Hypertension    Insomnia    Migraine     Assessment: Patient Reported Symptoms:  Cognitive Cognitive Status: Alert and oriented to person, place, and time, Normal speech and language skills      Neurological Neurological Review of Symptoms: Not assessed    HEENT HEENT Symptoms Reported: Not assessed      Cardiovascular Cardiovascular Symptoms Reported: Not assessed    Respiratory Respiratory Symptoms Reported: Not assesed    Endocrine Endocrine Symptoms Reported: Not assessed    Gastrointestinal Gastrointestinal Symptoms Reported: Not assessed      Genitourinary Genitourinary Symptoms Reported: Not assessed    Integumentary Integumentary Symptoms Reported: Not assessed    Musculoskeletal Musculoskelatal Symptoms Reviewed: Not assessed        Psychosocial Psychosocial Symptoms Reported: Anxiety - if selected complete GAD, Report of significant loss, deaths, abandonment, traumatic incidents Additional Psychological Details: Pt identified strong support system and healthy coping skills to assist with management of symptoms. Add'l strategies discussed. Behavioral Management Strategies: Adequate rest, Coping strategies, Medication therapy, Support system          08/15/2023    4:27 PM  Depression screen PHQ 2/9  Decreased Interest 2  Down, Depressed, Hopeless 2  PHQ - 2 Score 4  Altered sleeping 3  Tired, decreased energy 2  Change in appetite 2  Feeling bad or failure about yourself  2  Trouble concentrating 1  Moving slowly or  fidgety/restless 1  Suicidal thoughts 0  PHQ-9 Score 15  Difficult doing work/chores Somewhat difficult    There were no vitals filed for this visit.  Medications Reviewed Today     Reviewed by Langley Flatley D, LCSW (Social Worker) on 04/01/24 at 1544  Med List Status: <None>   Medication Order Taking? Sig Documenting Provider Last Dose Status Informant  amLODipine  (NORVASC ) 2.5 MG tablet [id number]  Take 1 tablet (2.5 mg total) by mouth daily. Nafziger, Darleene, NP  Active   cholecalciferol (VITAMIN D3) 25 MCG (1000 UNIT) tablet [id number]  Take 1,000 Units by mouth daily. [provider]  Active   clonazePAM  (KLONOPIN ) 0.5 MG tablet [id number]  TAKE 1 TABLET BY MOUTH EVERY DAY Nafziger, Cory, NP  Active   cyanocobalamin 100 MCG tablet [id number]  Take 1 tablet by mouth daily. [provider]  Active   dolutegravir -lamiVUDine  (DOVATO ) 50-300 MG tablet [id number]  Take 1 tablet by mouth daily. Jerrell Cleatus Ned, MD  Active   dolutegravir -lamiVUDine  (DOVATO ) 50-300 MG tablet [id number]  Take 1 tablet by mouth daily. Eben Reyes BROCKS, MD  Active   hydrochlorothiazide  (HYDRODIURIL ) 25 MG tablet [id number]  TAKE 1 TABLET BY MOUTH ONCE DAILY (PATIENT NEEDS PHYSICAL) Merna Darleene, NP  Active   Probiotic Product (PROBIOTIC BLEND PO) [id number]  Take 1 tablet by mouth. [provider]  Active             Recommendation:   Continue Current Plan of Care  Follow Up Plan:   Telephone follow-up in 1 month  Rolin Kerns, LCSW [place]  [facility], [name]  Clinical Scientist, clinical (histocompatibility and immunogenetics) Dial: [phone]  Fax: [phone] Website: [email or web address] 3:48 PM

## 2024-04-02 ENCOUNTER — Ambulatory Visit
Admission: RE | Admit: 2024-04-02 | Discharge: 2024-04-02 | Disposition: A | Discharge status: Home / Self Care | Source: Ambulatory Visit | Attending: Adult Health | Admitting: Adult Health

## 2024-04-02 ENCOUNTER — Encounter: Payer: Self-pay | Admitting: Adult Health

## 2024-04-02 DIAGNOSIS — Z1231 Encounter for screening mammogram for malignant neoplasm of breast: Secondary | ICD-10-CM

## 2024-04-25 ENCOUNTER — Other Ambulatory Visit: Payer: Self-pay

## 2024-04-29 ENCOUNTER — Ambulatory Visit: Payer: Self-pay

## 2024-04-29 NOTE — Telephone Encounter (Signed)
 FYI Only or Action Required?: Action required by provider: clinical question for provider.  Patient was last seen in primary care on 01/31/2024 by Merna Huxley, NP.  Called Nurse Triage reporting Advice Only.  Symptoms began today.  Interventions attempted: Nothing.  Symptoms are: gradually worsening.  Triage Disposition: Information or Advice Only Call  Patient/caregiver understands and will follow disposition?: Yes    Copied from CRM #[id number]. Topic: Clinical - Red Word Triage >> Apr 29, 2024  9:26 AM [name] wrote: Red Word that prompted transfer to Nurse Triage: Patient states is having side effects to the amLODipine  (NORVASC ) 2.5 MG tablet. Is experiencing an itchy, dry, painful rash on her face above her eyes and cheeks, as well as a deep itch within her ears. 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 Needs to discuss alternative medication. Reason for Disposition  Health information question, no triage required and triager able to answer question    Routing to PCP office for review  Answer Assessment - Initial Assessment Questions 1. REASON FOR CALL: What is the main reason for your call? or How can I best help you?     Pt is concerned about the side effects of norvasc , especially the the gum over-growth and the deep pockets due to Ca channel blockers.  Pt has been experiencing itchy, dry, painful rash on face & itchy ears as well.  Pt stated dentist is also checking a bump found in the mouth that was not there before: it could possibly related to her medication.  Pt stated at present time she does not feel comfortable taking the norvasc  anymore (today will be the last day patient will be taking this medication. therefore would like to discuss alternate medications.  Protocols used: Information Only Call - No Triage-A-AH

## 2024-04-30 ENCOUNTER — Other Ambulatory Visit: Payer: Self-pay

## 2024-04-30 ENCOUNTER — Telehealth: Payer: Self-pay

## 2024-04-30 ENCOUNTER — Other Ambulatory Visit (HOSPITAL_COMMUNITY): Payer: Self-pay

## 2024-04-30 NOTE — Telephone Encounter (Signed)
 Copied from CRM [id number]. Topic: Clinical - Medical Advice >> Apr 30, 2024  9:48 AM Henretta I wrote: Reason for CRM: Patient was triaged yesterday for side effects with amLODipine  (NORVASC ) 2.5 MG tablet, she spoke with nurse angie, who told her she was going to put it on high priority but patient still hasn't heard anything else. Patient is not currently experiencing any symptoms but is wanting to know if medication is going to be switched as she is currently only taking the hydrochlorothiazide .

## 2024-04-30 NOTE — Progress Notes (Signed)
 Specialty Pharmacy Refill Coordination Note  [name] is a 58 y.o. female contacted today regarding refills of specialty medication(s) Dolutegravir -lamiVUDine  (Dovato )   Patient requested Delivery   Delivery date: 05/08/24   Verified address: [name], [place] [address]   Medication will be filled on 05/07/24.

## 2024-04-30 NOTE — Telephone Encounter (Signed)
 Copied from CRM [id number]. Topic: Clinical - Medical Advice >> Apr 30, 2024  9:48 AM [name] wrote: Reason for CRM: Patient was triaged yesterday for side effects with amLODipine  (NORVASC ) 2.5 MG tablet, she spoke with nurse angie, who told her she was going to put it on high priority but patient still hasn't heard anything else. Patient is not currently experiencing any symptoms but is wanting to know if medication is going to be switched as she is currently only taking the hydrochlorothiazide . >> Apr 30, 2024  5:02 PM [name] wrote: [name] read the patient the message from the provider Have her stop Norvac and stay on hydrochlorothiazide . 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

## 2024-04-30 NOTE — Progress Notes (Signed)
 Specialty Pharmacy Ongoing Clinical Assessment Note  [name] is a 58 y.o. female who is being followed by the specialty pharmacy service for RxSp HIV   Patient's specialty medication(s) reviewed today: Dolutegravir -lamiVUDine  (Dovato )   Missed doses in the last 4 weeks: 0   Patient/Caregiver did not have any additional questions or concerns.   Therapeutic benefit summary: Patient is achieving benefit   Adverse events/side effects summary: No adverse events/side effects   Patient's therapy is appropriate to: Continue    Goals Addressed             This Visit's Progress    Achieve Undetectable HIV Viral Load < 20   On track    Patient is on track . Patient will maintain adherence.  Viral load remained undetectable as of 08/15/23.         Follow up: 12 months  Silvano [name] Specialty Pharmacist

## 2024-05-01 ENCOUNTER — Telehealth: Payer: Self-pay | Admitting: Adult Health

## 2024-05-01 NOTE — Telephone Encounter (Signed)
**Note De-identified  [place] Obfuscation** Please advise 

## 2024-05-01 NOTE — Telephone Encounter (Signed)
 Copied from CRM [id number]. Topic: Clinical - Medical Advice >> Apr 30, 2024  9:48 AM Henretta I wrote: Reason for CRM: Patient was triaged yesterday for side effects with amLODipine  (NORVASC ) 2.5 MG tablet, she spoke with nurse angie, who told her she was going to put it on high priority but patient still hasn't heard anything else. Patient is not currently experiencing any symptoms but is wanting to know if medication is going to be switched as she is currently only taking the hydrochlorothiazide .

## 2024-05-01 NOTE — Telephone Encounter (Signed)
 Pt stated that she is allergic to the calcium blocker bp meds. Pt stated that she developed pockets in her gum also having facial rash. Please refer to message.

## 2024-05-01 NOTE — Telephone Encounter (Signed)
 Spoke to Jerome verbally and he stated he will find another BP medication once pt rash has cleared up. Pt will do a video visit for follow up due to starting a new job and she is unable to take off. Pt has been scheduled and will have her BP machine and Bp readings for VV.

## 2024-05-01 NOTE — Telephone Encounter (Unsigned)
 Copied from CRM [id number]. Topic: Clinical - Medical Advice >> Apr 30, 2024  9:48 AM [name] wrote: Reason for CRM: Patient was triaged yesterday for side effects with amLODipine  (NORVASC ) 2.5 MG tablet, she spoke with nurse angie, who told her she was going to put it on high priority but patient still hasn't heard anything else. Patient is not currently experiencing any symptoms but is wanting to know if medication is going to be switched as she is currently only taking the hydrochlorothiazide .

## 2024-05-03 ENCOUNTER — Telehealth: Admitting: Licensed Clinical Social Worker

## 2024-05-07 ENCOUNTER — Encounter: Payer: Self-pay | Admitting: Adult Health

## 2024-05-07 ENCOUNTER — Other Ambulatory Visit: Payer: Self-pay

## 2024-05-07 ENCOUNTER — Telehealth (INDEPENDENT_AMBULATORY_CARE_PROVIDER_SITE_OTHER): Admitting: Adult Health

## 2024-05-07 DIAGNOSIS — I10 Essential (primary) hypertension: Secondary | ICD-10-CM

## 2024-05-07 MED ORDER — LISINOPRIL 5 MG PO TABS: 5.0000 mg | ORAL_TABLET | Freq: Every day | ORAL | 0 refills | Status: DC

## 2024-05-07 NOTE — Progress Notes (Signed)
 Virtual Visit via Video Note  I connected with [name] on 05/07/24 at  5:00 PM EDT by a video enabled telemedicine application and verified that I am speaking with the correct person using two identifiers.  Location patient: home Location provider:work or home office Persons participating in the virtual visit: patient, provider  I discussed the limitations of evaluation and management by telemedicine and the availability of in person appointments. The patient expressed understanding and agreed to proceed.   HPI:  58 year old female who is being evaluated today for follow-up regarding hypertension.  In May 2025 she was placed on Norvasc  2.5 mg in addition to her chronic HCTZ to help give better blood pressure control.  When she went dentist last week she was informed that she had hypertrophy of her gums and it was noted that she had a rash on her eyelids and cheeks.  She came off the medication and the rash significant.  She has been monitoring her blood pressure and over the last 2 days it has been in the high 130s to high 140s over 80s to 90s.  ROS: See pertinent positives and negatives per HPI.  Past Medical History:  Diagnosis Date   Allergy    Anxiety    HIV (human immunodeficiency virus infection) (HCC)    Hypertension    Insomnia    Migraine     Past Surgical History:  Procedure Laterality Date   ABDOMINAL HYSTERECTOMY Right 2004   partial    Family History  Problem Relation Age of Onset   Cerebral aneurysm Father 50   Aortic aneurysm Father    Diabetes Other    Hypertension Other    Brain cancer Other    Breast cancer Neg Hx    Colon cancer Neg Hx    Esophageal cancer Neg Hx    Stomach cancer Neg Hx    Rectal cancer Neg Hx        Current Outpatient Medications:    lisinopril  (ZESTRIL ) 5 MG tablet, Take 1 tablet (5 mg total) by mouth daily., Disp: 90 tablet, Rfl: 0   cholecalciferol (VITAMIN D3) 25 MCG (1000 UNIT) tablet, Take 1,000 Units by mouth daily.,  Disp: , Rfl:    clonazePAM  (KLONOPIN ) 0.5 MG tablet, TAKE 1 TABLET BY MOUTH EVERY DAY, Disp: 30 tablet, Rfl: 2   cyanocobalamin 100 MCG tablet, Take 1 tablet by mouth daily., Disp: , Rfl:    dolutegravir -lamiVUDine  (DOVATO ) 50-300 MG tablet, Take 1 tablet by mouth daily., Disp: 30 tablet, Rfl: 11   dolutegravir -lamiVUDine  (DOVATO ) 50-300 MG tablet, Take 1 tablet by mouth daily., Disp: 30 tablet, Rfl: 11   hydrochlorothiazide  (HYDRODIURIL ) 25 MG tablet, TAKE 1 TABLET BY MOUTH ONCE DAILY (PATIENT NEEDS PHYSICAL), Disp: 30 tablet, Rfl: 11   Probiotic Product (PROBIOTIC BLEND PO), Take 1 tablet by mouth., Disp: , Rfl:   EXAM:  VITALS per patient if applicable:  GENERAL: alert, oriented, appears well and in no acute distress  HEENT: atraumatic, conjunttiva clear, no obvious abnormalities on inspection of external nose and ears  NECK: normal movements of the head and neck  LUNGS: on inspection no signs of respiratory distress, breathing rate appears normal, no obvious gross SOB, gasping or wheezing  CV: no obvious cyanosis  MS: moves all visible extremities without noticeable abnormality  PSYCH/NEURO: pleasant and cooperative, no obvious depression or anxiety, speech and thought processing grossly intact  ASSESSMENT AND PLAN:  Discussed the following assessment and plan:  1. Essential hypertension (Primary) - Will add low  dose lisinopril  to her regimen. Follow up via Mychart with BP readings over the next 3-4 weeks  - lisinopril  (ZESTRIL ) 5 MG tablet; Take 1 tablet (5 mg total) by mouth daily.  Dispense: 90 tablet; Refill: 0     I discussed the assessment and treatment plan with the patient. The patient was provided an opportunity to ask questions and all were answered. The patient agreed with the plan and demonstrated an understanding of the instructions.   The patient was advised to call back or seek an in-person evaluation if the symptoms worsen or if the condition fails to improve  as anticipated.   Milanni Ayub, NP

## 2024-06-03 ENCOUNTER — Other Ambulatory Visit: Payer: Self-pay

## 2024-06-05 ENCOUNTER — Other Ambulatory Visit: Payer: Self-pay

## 2024-06-06 ENCOUNTER — Other Ambulatory Visit: Payer: Self-pay

## 2024-06-06 ENCOUNTER — Other Ambulatory Visit (HOSPITAL_COMMUNITY): Payer: Self-pay

## 2024-06-06 NOTE — Progress Notes (Signed)
 Specialty Pharmacy Refill Coordination Note  Spoke with CAITLYNE INGHAM  DIDI GANAWAY is a 58 y.o. female contacted today regarding refills of specialty medication(s) Dolutegravir -lamiVUDine  (Dovato )  Doses on hand: 8  Patient requested: Delivery   Delivery date: 06/11/24   Verified address: [address] [place] [address]  Medication will be filled on 06/10/24.

## 2024-06-12 ENCOUNTER — Telehealth: Payer: Self-pay

## 2024-06-12 NOTE — Telephone Encounter (Signed)
 Copied from CRM #[id number]. Topic: General - Other >> Jun 11, 2024  1:31 PM Shardie S wrote: Reason for CRM: Patient calling to inform provider that she is not having any symptoms/concerns after medication change. She scheduled a follow-up appointment for 10/16, but if it is not needed/required by PCP, she states it can be cancelled. She is requesting a callback.

## 2024-06-13 ENCOUNTER — Telehealth: Payer: Self-pay | Admitting: *Deleted

## 2024-06-13 NOTE — Telephone Encounter (Signed)
 Call pt no answer.

## 2024-06-13 NOTE — Telephone Encounter (Signed)
 Copied from CRM [id number]. Topic: General - Other >> Jun 13, 2024  4:03 PM Jasmin G wrote: Reason for CRM: Pt called regarding recent missed call from Ms. Vicci Leader R, CMA, called [employer] but she was not available. Please call pt back at [phone] to discuss recent inquiry.

## 2024-06-19 NOTE — Telephone Encounter (Signed)
 Called pt multiple times since last week no answer.

## 2024-06-20 ENCOUNTER — Ambulatory Visit: Admitting: Adult Health

## 2024-06-20 DIAGNOSIS — Z01419 Encounter for gynecological examination (general) (routine) without abnormal findings: Secondary | ICD-10-CM | POA: Diagnosis not present

## 2024-07-03 ENCOUNTER — Other Ambulatory Visit: Payer: Self-pay

## 2024-07-05 ENCOUNTER — Other Ambulatory Visit: Payer: Self-pay

## 2024-07-05 ENCOUNTER — Other Ambulatory Visit: Payer: Self-pay | Admitting: Pharmacy Technician

## 2024-07-05 NOTE — Progress Notes (Signed)
 Specialty Pharmacy Refill Coordination Note  [name] is a 58 y.o. female contacted today regarding refills of specialty medication(s) Dolutegravir -lamiVUDine  (Dovato )   Patient requested Delivery   Delivery date: 07/09/24   Verified address: [address] [name] [address]   Medication will be filled on: 07/08/24

## 2024-07-08 ENCOUNTER — Encounter: Payer: Self-pay | Admitting: Radiology

## 2024-07-23 ENCOUNTER — Other Ambulatory Visit: Payer: Self-pay

## 2024-07-31 ENCOUNTER — Other Ambulatory Visit: Payer: Self-pay

## 2024-08-05 ENCOUNTER — Other Ambulatory Visit: Payer: Self-pay

## 2024-08-06 ENCOUNTER — Other Ambulatory Visit: Payer: Self-pay | Admitting: Adult Health

## 2024-08-06 DIAGNOSIS — I10 Essential (primary) hypertension: Secondary | ICD-10-CM

## 2024-08-07 ENCOUNTER — Other Ambulatory Visit: Payer: Self-pay

## 2024-08-08 ENCOUNTER — Other Ambulatory Visit: Payer: Self-pay

## 2024-08-08 ENCOUNTER — Other Ambulatory Visit (HOSPITAL_COMMUNITY): Payer: Self-pay

## 2024-08-09 ENCOUNTER — Other Ambulatory Visit (HOSPITAL_COMMUNITY): Payer: Self-pay

## 2024-08-09 ENCOUNTER — Telehealth: Payer: Self-pay

## 2024-08-09 ENCOUNTER — Other Ambulatory Visit: Payer: Self-pay

## 2024-08-09 NOTE — Telephone Encounter (Signed)
 Received message from [name] outpatient pharmacy regarding patients Dovato  medication.   Insurance now is requiring patient use [facility] pharmacy. Please send new RX. Per [place], [facility] prefers a new script instead of a transfer.

## 2024-08-09 NOTE — Progress Notes (Signed)
 Patient must fill with [facility]. Medication has been sent to preferred pharmacy.

## 2024-08-12 ENCOUNTER — Other Ambulatory Visit: Payer: Self-pay | Admitting: Infectious Diseases

## 2024-08-12 DIAGNOSIS — B20 Human immunodeficiency virus [HIV] disease: Secondary | ICD-10-CM

## 2024-08-12 MED ORDER — DOVATO 50-300 MG PO TABS: 1.0000 | ORAL_TABLET | Freq: Every day | ORAL | 4 refills | Status: DC

## 2024-08-23 ENCOUNTER — Other Ambulatory Visit: Payer: Self-pay | Admitting: Adult Health

## 2024-08-23 DIAGNOSIS — B20 Human immunodeficiency virus [HIV] disease: Secondary | ICD-10-CM

## 2024-08-23 NOTE — Telephone Encounter (Unsigned)
 Copied from CRM #[id number]. Topic: Clinical - Medication Refill >> Aug 23, 2024  3:23 PM Chiquita SQUIBB wrote: Medication: dolutegravir -lamiVUDine  (DOVATO ) 50-300 MG tablet  Patient states that the medication needs to go to [facility] due to insurance and the pharmacy at [facility] having an error, so the patient could not get it.    Has the patient contacted their pharmacy? Yes (Agent: If no, request that the patient contact the pharmacy for the refill. If patient does not wish to contact the pharmacy document the reason why and proceed with request.) (Agent: If yes, when and what did the pharmacy advise?)  This is the patient's preferred pharmacy:   [facility] [place], [place] - [address], [address], [address] [place] [address] Phone: [phone] Fax: [phone]  Is this the correct pharmacy for this prescription? Yes If no, delete pharmacy and type the correct one.   Has the prescription been filled recently? No  Is the patient out of the medication? Yes  Has the patient been seen for an appointment in the last year OR does the patient have an upcoming appointment? Yes  Can we respond through MyChart? Yes  Agent: Please be advised that Rx refills may take up to 3 business days. We ask that you follow-up with your pharmacy.

## 2024-08-26 MED ORDER — DOVATO 50-300 MG PO TABS: 1.0000 | ORAL_TABLET | Freq: Every day | ORAL | 4 refills | Status: AC
